# Patient Record
Sex: Male | Born: 1945 | Race: White | Hispanic: No | Marital: Married | Smoking: Never smoker
Health system: Southern US, Community
[De-identification: ages and names within clinical notes are randomized; demographics above are authoritative.]

## PROBLEM LIST (undated history)

## (undated) DIAGNOSIS — R55 Syncope and collapse: Secondary | ICD-10-CM

## (undated) DIAGNOSIS — I071 Rheumatic tricuspid insufficiency: Secondary | ICD-10-CM

## (undated) DIAGNOSIS — E785 Hyperlipidemia, unspecified: Secondary | ICD-10-CM

## (undated) DIAGNOSIS — M19019 Primary osteoarthritis, unspecified shoulder: Secondary | ICD-10-CM

## (undated) DIAGNOSIS — I517 Cardiomegaly: Secondary | ICD-10-CM

## (undated) DIAGNOSIS — Z950 Presence of cardiac pacemaker: Secondary | ICD-10-CM

## (undated) DIAGNOSIS — I447 Left bundle-branch block, unspecified: Secondary | ICD-10-CM

## (undated) DIAGNOSIS — I441 Atrioventricular block, second degree: Secondary | ICD-10-CM

## (undated) DIAGNOSIS — I10 Essential (primary) hypertension: Secondary | ICD-10-CM

## (undated) DIAGNOSIS — I340 Nonrheumatic mitral (valve) insufficiency: Secondary | ICD-10-CM

## (undated) DIAGNOSIS — I428 Other cardiomyopathies: Secondary | ICD-10-CM

## (undated) DIAGNOSIS — R918 Other nonspecific abnormal finding of lung field: Secondary | ICD-10-CM

## (undated) DIAGNOSIS — G4733 Obstructive sleep apnea (adult) (pediatric): Secondary | ICD-10-CM

## (undated) DIAGNOSIS — C439 Malignant melanoma of skin, unspecified: Secondary | ICD-10-CM

## (undated) DIAGNOSIS — E119 Type 2 diabetes mellitus without complications: Secondary | ICD-10-CM

## (undated) HISTORY — DX: Primary osteoarthritis, unspecified shoulder: M19.019

## (undated) HISTORY — PX: VASECTOMY: SHX75

## (undated) HISTORY — DX: Other cardiomyopathies: I42.8

## (undated) HISTORY — PX: UVULECTOMY: SHX2631

## (undated) HISTORY — PX: INGUINAL HERNIA REPAIR: SHX194

## (undated) HISTORY — DX: Left bundle-branch block, unspecified: I44.7

## (undated) HISTORY — DX: Other nonspecific abnormal finding of lung field: R91.8

## (undated) HISTORY — PX: FINGER TENDON REPAIR: SHX1640

## (undated) HISTORY — DX: Syncope and collapse: R55

## (undated) HISTORY — DX: Malignant melanoma of skin, unspecified: C43.9

## (undated) HISTORY — DX: Essential (primary) hypertension: I10

---

## 1966-07-13 DIAGNOSIS — C439 Malignant melanoma of skin, unspecified: Secondary | ICD-10-CM

## 1966-07-13 HISTORY — DX: Malignant melanoma of skin, unspecified: C43.9

## 2006-05-28 LAB — HM COLONOSCOPY: HM Colonoscopy: NORMAL

## 2006-08-26 ENCOUNTER — Ambulatory Visit (HOSPITAL_BASED_OUTPATIENT_CLINIC_OR_DEPARTMENT_OTHER): Admission: RE | Admit: 2006-08-26 | Discharge: 2006-08-26 | Payer: Self-pay | Admitting: Surgery

## 2006-11-25 ENCOUNTER — Emergency Department (HOSPITAL_COMMUNITY): Admission: EM | Admit: 2006-11-25 | Discharge: 2006-11-26 | Payer: Self-pay | Admitting: Emergency Medicine

## 2006-11-30 ENCOUNTER — Ambulatory Visit (HOSPITAL_COMMUNITY): Admission: RE | Admit: 2006-11-30 | Discharge: 2006-11-30 | Payer: Self-pay | Admitting: Orthopaedic Surgery

## 2007-05-24 ENCOUNTER — Ambulatory Visit (HOSPITAL_COMMUNITY): Admission: RE | Admit: 2007-05-24 | Discharge: 2007-05-24 | Payer: Self-pay | Admitting: Surgery

## 2009-07-13 DIAGNOSIS — R55 Syncope and collapse: Secondary | ICD-10-CM

## 2009-07-13 HISTORY — DX: Syncope and collapse: R55

## 2009-11-23 ENCOUNTER — Ambulatory Visit: Payer: Self-pay | Admitting: Cardiology

## 2009-11-23 ENCOUNTER — Observation Stay (HOSPITAL_COMMUNITY): Admission: EM | Admit: 2009-11-23 | Discharge: 2009-11-24 | Payer: Self-pay | Admitting: Emergency Medicine

## 2009-11-24 ENCOUNTER — Encounter (INDEPENDENT_AMBULATORY_CARE_PROVIDER_SITE_OTHER): Payer: Self-pay | Admitting: Internal Medicine

## 2010-02-12 ENCOUNTER — Telehealth (INDEPENDENT_AMBULATORY_CARE_PROVIDER_SITE_OTHER): Payer: Self-pay | Admitting: *Deleted

## 2010-02-13 ENCOUNTER — Encounter: Payer: Self-pay | Admitting: Internal Medicine

## 2010-02-13 ENCOUNTER — Ambulatory Visit: Payer: Self-pay | Admitting: Internal Medicine

## 2010-02-13 ENCOUNTER — Ambulatory Visit: Payer: Self-pay

## 2010-02-13 ENCOUNTER — Encounter (HOSPITAL_COMMUNITY): Admission: RE | Admit: 2010-02-13 | Discharge: 2010-04-03 | Payer: Self-pay | Admitting: Cardiology

## 2010-02-19 ENCOUNTER — Telehealth: Payer: Self-pay | Admitting: Cardiology

## 2010-02-20 ENCOUNTER — Telehealth: Payer: Self-pay | Admitting: Cardiology

## 2010-08-12 NOTE — Assessment & Plan Note (Signed)
Summary: Cardiology Nuclear Testing  Nuclear Med Background Indications for Stress Test: Evaluation for Ischemia, Post Hospital  Indications Comments: 11/20/09 syncope/ new LBBB   History: Echo  History Comments: 05/11 ECHO: EF= 55%, mild LVH  Symptoms: Diaphoresis, Dizziness, Light-Headedness, Syncope    Nuclear Pre-Procedure Cardiac Risk Factors: LBBB, Lipids Caffeine/Decaff Intake: None NPO After: 7:00 PM Lungs: Clear.  O2 Sat 98% on RA. IV 0.9% NS with Angio Cath: 22g     IV Site: (R) AC IV Started by: Irean Hong RN Chest Size (in) 42     Height (in): 74 Weight (lb): 220 BMI: 28.35  Nuclear Med Study 1 or 2 day study:  1 day     Stress Test Type:  Eugenie Birks Reading MD:  Dietrich Pates, MD     Referring MD:  Raquel Sarna, MD [facility] [place] Fax#[phone]) Resting Radionuclide:  Technetium 46m Tetrofosmin     Resting Radionuclide Dose:  10.7 mCi  Stress Radionuclide:  Technetium 25m Tetrofosmin     Stress Radionuclide Dose:  31.7 mCi   Stress Protocol   Lexiscan: 0.4 mg   Stress Test Technologist:  [employer] CMA-N     Nuclear Technologist:  Domenic Polite CNMT  Rest Procedure  Myocardial perfusion imaging was performed at rest 45 minutes following the intravenous administration of Myoview Technetium 16m Tetrofosmin.  Stress Procedure  The patient received IV Lexiscan 0.4 mg over 15-seconds.  Myoview injected at 30-seconds.  There were no significant changes with infusion, other than patient developing LBBB in recovery.  He did c/o chest pressure with infusion.  Quantitative spect images were obtained after a 45 minute delay.  QPS Raw Data Images:  Rest images were motion corrected.  Soft tissue (diaphragm, bowel acitvity) underlie heart. Stress Images:  Thinning in the inferoseptal wall (base) and apex..  Otherwise normal perfusion. Rest Images:  NO signficant change from the stress images. Subtraction (SDS):  No evidence of ischemia. Transient Ischemic  Dilatation:  1.05  (Normal <1.22)  Lung/Heart Ratio:  .32  (Normal <0.45)  Quantitative Gated Spect Images QGS EDV:  119 ml QGS ESV:  61 ml QGS EF:  49 % QGS cine images:  By visual estimate appears slightly better.   Overall Impression  Exercise Capacity: Lexiscan protocol. BP Response: Normal blood pressure response. Clinical Symptoms: Mild chest pressure. ECG Impression: Nondiagnostic as patieitn developed transient LBBB during study. Overall Impression: Normal perfusioin.  No evidence of significant ischemia. Overall Impression Comments: would recommend echo if not done to fully evaluate wall motion, LV function.

## 2010-08-12 NOTE — Progress Notes (Signed)
Summary: Nuclear Pre-Procedure     Phone Note Outgoing Call   Call placed by: Milana Na, EMT-P,  February 12, 2010 3:34 PM Summary of Call: Left message with information on Myoview Information Sheet (see scanned document for details).      Nuclear Med Background Indications for Stress Test: Evaluation for Ischemia, Post Hospital   History: Echo  History Comments: 05/11 ECHO EF 55% mild LVH  Symptoms: Light-Headedness, Syncope    Nuclear Pre-Procedure Cardiac Risk Factors: LBBB, Lipids  Nuclear Med Study Referring MD:  A.Harlene Ramus

## 2010-08-12 NOTE — Progress Notes (Signed)
Summary: Stress test results  Phone Note Call from Patient Call back at Home Phone [phone]   Caller: Patient Summary of Call: test results Initial call taken by: Judie Grieve,  February 19, 2010 2:17 PM  Follow-up for Phone Call        The pt had a myoview in our office 02/13/10.  This test was scheduled by Dr Wray Kearns.  (Referring MD:  Raquel Sarna, MD [facility] [place] Fax#[phone]))  I attempted to reach the pt to let him know he must obtain these results from referring MD.  No answer. Julieta Gutting, RN, BSN  February 19, 2010 2:30 PM  I spoke with the pt's wife and made her aware that the pt will need to obtain his results through the [place] since they are the ordering MD. She is agreeable and will notify the pt. Follow-up by: Sherri Rad, RN, BSN,  February 20, 2010 9:27 AM

## 2010-08-12 NOTE — Progress Notes (Signed)
Summary: results of test  Phone Note Call from Patient   Caller: Patient Reason for Call: Talk to Nurse Summary of Call: [facility], i suggested he call them and he states they may or may not ever call him back, he says we told him when he left we would call him with results -pls call [phone] after 4p Initial call taken by: Glynda Jaeger,  February 20, 2010 1:39 PM  Follow-up for Phone Call        Called pt's home and [facility] that [facility] to [facility] results because the [facility] physician ordered the test. Follow-up by: J REISS RN     Appended Document: results of test agree  Reviewed Juanito Doom, MD

## 2010-09-29 LAB — BASIC METABOLIC PANEL
BUN: 14 mg/dL (ref 6–23)
CO2: 27 mEq/L (ref 19–32)
Calcium: 8.9 mg/dL (ref 8.4–10.5)
Chloride: 105 mEq/L (ref 96–112)
Creatinine, Ser: 1 mg/dL (ref 0.4–1.5)
GFR calc Af Amer: >60 mL/min (ref >60)
GFR calc non Af Amer: >60 mL/min (ref >60)
Glucose, Bld: 121 mg/dL — ABNORMAL HIGH (ref 70–99)
Potassium: 4.2 mEq/L (ref 3.5–5.1)
Sodium: 140 mEq/L (ref 135–145)

## 2010-09-29 LAB — DIFFERENTIAL
Basophils Absolute: 0 10*3/uL (ref 0.0–0.1)
Basophils Relative: 0 % (ref 0–1)
Eosinophils Absolute: 0.2 10*3/uL (ref 0.0–0.7)
Eosinophils Relative: 2 % (ref 0–5)
Lymphocytes Relative: 15 % (ref 12–46)
Lymphs Abs: 1.2 10*3/uL (ref 0.7–4.0)
Monocytes Absolute: 1.3 10*3/uL — ABNORMAL HIGH (ref 0.1–1.0)
Monocytes Relative: 15 % — ABNORMAL HIGH (ref 3–12)
Neutro Abs: 5.7 10*3/uL (ref 1.7–7.7)
Neutrophils Relative %: 68 % (ref 43–77)

## 2010-09-29 LAB — CBC
HCT: 40.6 % (ref 39.0–52.0)
Hemoglobin: 14 g/dL (ref 13.0–17.0)
MCHC: 34.6 g/dL (ref 30.0–36.0)
MCV: 89.7 fL (ref 78.0–100.0)
Platelets: 153 10*3/uL (ref 150–400)
RBC: 4.52 MIL/uL (ref 4.22–5.81)
RDW: 13.8 % (ref 11.5–15.5)
WBC: 8.5 10*3/uL (ref 4.0–10.5)

## 2010-09-29 LAB — LIPID PANEL
Cholesterol: 193 mg/dL (ref 0–200)
HDL: 34 mg/dL — ABNORMAL LOW (ref >39)
LDL Cholesterol: 126 mg/dL — ABNORMAL HIGH (ref 0–99)
Total CHOL/HDL Ratio: 5.7 RATIO
Triglycerides: 166 mg/dL — ABNORMAL HIGH (ref <150)
VLDL: 33 mg/dL (ref 0–40)

## 2010-09-29 LAB — D-DIMER, QUANTITATIVE (NOT AT ARMC): D-Dimer, Quant: <0.22 ug/mL-FEU (ref 0.00–0.48)

## 2010-09-29 LAB — GLUCOSE, CAPILLARY: Glucose-Capillary: 123 mg/dL — ABNORMAL HIGH (ref 70–99)

## 2010-09-30 LAB — PROTIME-INR
INR: 0.93 (ref 0.00–1.49)
Prothrombin Time: 12.4 seconds (ref 11.6–15.2)

## 2010-09-30 LAB — POCT CARDIAC MARKERS
CKMB, poc: 2.8 ng/mL (ref 1.0–8.0)
CKMB, poc: 4 ng/mL (ref 1.0–8.0)
Myoglobin, poc: 128 ng/mL (ref 12–200)
Myoglobin, poc: 93.7 ng/mL (ref 12–200)
Troponin i, poc: <0.05 ng/mL (ref 0.00–0.09)
Troponin i, poc: <0.05 ng/mL (ref 0.00–0.09)

## 2010-09-30 LAB — COMPREHENSIVE METABOLIC PANEL
ALT: 35 U/L (ref 0–53)
AST: 35 U/L (ref 0–37)
Albumin: 4 g/dL (ref 3.5–5.2)
Alkaline Phosphatase: 77 U/L (ref 39–117)
BUN: 16 mg/dL (ref 6–23)
CO2: 29 mEq/L (ref 19–32)
Calcium: 9.4 mg/dL (ref 8.4–10.5)
Chloride: 105 mEq/L (ref 96–112)
Creatinine, Ser: 1.02 mg/dL (ref 0.4–1.5)
GFR calc Af Amer: >60 mL/min (ref >60)
GFR calc non Af Amer: >60 mL/min (ref >60)
Glucose, Bld: 147 mg/dL — ABNORMAL HIGH (ref 70–99)
Potassium: 3.9 mEq/L (ref 3.5–5.1)
Sodium: 140 mEq/L (ref 135–145)
Total Bilirubin: 0.6 mg/dL (ref 0.3–1.2)
Total Protein: 6.9 g/dL (ref 6.0–8.3)

## 2010-09-30 LAB — HEMOGLOBIN A1C
Hgb A1c MFr Bld: 6.4 % — ABNORMAL HIGH (ref <5.7)
Mean Plasma Glucose: 137 mg/dL — ABNORMAL HIGH (ref <117)

## 2010-09-30 LAB — CBC
HCT: 43.8 % (ref 39.0–52.0)
Hemoglobin: 15.1 g/dL (ref 13.0–17.0)
MCHC: 34.5 g/dL (ref 30.0–36.0)
MCV: 90.3 fL (ref 78.0–100.0)
Platelets: 165 10*3/uL (ref 150–400)
RBC: 4.85 MIL/uL (ref 4.22–5.81)
RDW: 13.6 % (ref 11.5–15.5)
WBC: 8.4 10*3/uL (ref 4.0–10.5)

## 2010-09-30 LAB — DIFFERENTIAL
Basophils Absolute: 0 10*3/uL (ref 0.0–0.1)
Basophils Relative: 0 % (ref 0–1)
Eosinophils Absolute: 0.1 10*3/uL (ref 0.0–0.7)
Eosinophils Relative: 1 % (ref 0–5)
Lymphocytes Relative: 7 % — ABNORMAL LOW (ref 12–46)
Lymphs Abs: 0.6 10*3/uL — ABNORMAL LOW (ref 0.7–4.0)
Monocytes Absolute: 0.8 10*3/uL (ref 0.1–1.0)
Monocytes Relative: 10 % (ref 3–12)
Neutro Abs: 6.9 10*3/uL (ref 1.7–7.7)
Neutrophils Relative %: 82 % — ABNORMAL HIGH (ref 43–77)

## 2010-09-30 LAB — CK TOTAL AND CKMB (NOT AT ARMC)
CK, MB: 3.7 ng/mL (ref 0.3–4.0)
Relative Index: 2 (ref 0.0–2.5)
Total CK: 184 U/L (ref 7–232)

## 2010-09-30 LAB — MAGNESIUM: Magnesium: 2.4 mg/dL (ref 1.5–2.5)

## 2010-09-30 LAB — TROPONIN I: Troponin I: 0.03 ng/mL (ref 0.00–0.06)

## 2010-09-30 LAB — APTT: aPTT: 27 seconds (ref 24–37)

## 2010-11-25 NOTE — Op Note (Signed)
NAMEMALEKE, FERIA                 ACCOUNT NO.:  [id number]   MEDICAL RECORD NO.:  [id number]          PATIENT TYPE:  AMB   LOCATION:  DAY                          FACILITY:  [facility]   PHYSICIAN:  Wilmon Arms. Corliss Skains, M.D. DATE OF BIRTH:  1945-10-14   DATE OF PROCEDURE:  05/24/2007  DATE OF DISCHARGE:                               OPERATIVE REPORT   PREOPERATIVE DIAGNOSIS:  Left inguinal hernia.   POSTOPERATIVE DIAGNOSIS:  Left inguinal hernia.   PROCEDURE PERFORMED:  Left inguinal hernia repair with mesh.   SURGEON:  Wilmon Arms. Tsuei, M.D.   ANESTHESIA:  General via LMA.   INDICATIONS:  The patient is a 65 year old male who had a right inguinal  hernia repaired last year.  The patient had a hand injury and has  required physical therapy for the last several months.  He has been  developing some left groin pain.  He presents now for surgical  evaluation.   DESCRIPTION OF PROCEDURE:  The patient is brought to the operating room  and placed in the supine position on the operating table.  After  adequate level of general anesthesia was obtained, the patient's left  groin was shaved, prepped with Betadine and draped in sterile fashion.  Time-out was taken to assure the proper patient, proper procedure.  The  area above the left inguinal ligament was infiltrated with 0.25%  Marcaine.  An oblique incision was made.  Dissection was carried down to  the fascia.  The fascia was opened along direction of its fibers down  the external ring.  I bluntly dissected around the spermatic cord and  retracted with a Penrose Rose drain.  The floor of the inguinal canal  appeared lax, but there was no direct defect.  A moderate-sized indirect  hernia sac was dissected free from the spermatic cord and reduced up to  the internal ring.  Some cord lipoma was also removed and reduced.  The  internal ring was tightened with a figure-of-eight 2-0 Vicryl suture.  A  3-inch x 6-inch Ultrapro mesh was cut into  a keyhole shape and secured  beginning at the pubic tubercle with interrupted 2-0 Vicryl sutures.  This was secured to the shelving edge of the inguinal ligament  inferiorly and the internal oblique superiorly.  The tails were sutured  together around the spermatic cord and tucked underneath the external  oblique fascia.  The external oblique fascia was reapproximated with 2-0  Vicryl suture.  A 3-0 Vicryl was used to close the subcutaneous tissues,  and 4-0 Monocryl was used to close skin.  Steri-Strips and clean  dressings were applied.  The patient was extubated and brought to  recovery room in stable condition.  All sponge, instrument, and needle  counts were correct.      Wilmon Arms. Tsuei, M.D.  Electronically Signed     MKT/MEDQ  D:  05/24/2007  T:  05/24/2007  Job:  [id number]

## 2010-11-25 NOTE — Op Note (Signed)
NAMEIZAIH, KATAOKA                 ACCOUNT NO.:  [id number]   MEDICAL RECORD NO.:  [id number]          PATIENT TYPE:  AMB   LOCATION:  SDS                          FACILITY:  [facility]   PHYSICIAN:  Vanita Panda. Magnus Ivan, M.D.DATE OF BIRTH:  [birth year]   DATE OF PROCEDURE:  11/30/2006  DATE OF DISCHARGE:                               OPERATIVE REPORT   PREOPERATIVE NOTE:  Right middle finger extensor tendon/central slip  injury.   POSTOPERATIVE DIAGNOSIS:  Right middle finger extensor tendon/central  slip injury.   PROCEDURE:  Direct primary repair of right middle finger extensor  tendon/central slip.   SURGEON:  Vanita Panda. Magnus Ivan, M.D.   ANESTHESIA:  1. IV sedation.  2. Digital block right middle finger with 0.25% plain Marcaine.   ANTIBIOTICS:  1 gram IV Ancef.   BLOOD LOSS:  Minimal.   COMPLICATIONS:  None.   INDICATIONS:  Briefly, Mr. Sawchuk is a 60-year right hand dominant male  who works for the [employer].  He was breaking up some boxes with  someone of work when the person accidentally slipped with the box cutter  and cut across the dorsal surface of his right dominant middle finger at  the level of the PIP joint and also across the index finger toward the  DIP joint.  The ER physician saw him and cleaned the wounds and  recognized that he had a deep wound that likely involved the central  slip of the extensor tendon. On examination in my office the next day,  he did have weakness with extension, he could extend through his  lumbricals and flexed, but he certainly had pain over that joint.  I  recommended he undergo exploration of the middle finger with direct  primary repair of the tendon. As far as the index finger goes, I think  this represents more of a very mild maldeformity and he can extend his  DIP joint almost fully but cannot hyperextend.  This will be treated  with splinting. The risks and benefits of surgery were explained and  well  understood.  He agreed to proceed with surgery.   PROCEDURE DESCRIPTION:  After informed consent was obtained, the  appropriate right arm was marked.  Mr. Karel was brought to the  operating room and placed supine on the operating table.  IV sedation  was obtained.  The hand was prepped and draped with Betadine scrub and  paint.  I then performed a digital block in the middle finger on the  right hand with 0.25% plain Marcaine.  I used a little Penrose drain as  a local tourniquet.  His incision was a transverse incision directly  over the PIP joint.  I extended this in a zigzag fashion proximally and  distally to expose the central slip. There was a direct laceration of  the central slip.  I irrigated this out and closed this direct  laceration with interrupted simple sutures 3-0 Ethilon suture.  The  wound was then irrigated again. I reapproximated the skin with  interrupted 3-0 Prolene suture.  Xeroform followed by a well  padded  sterile dressing and little finger splints were applied to the middle  finger and the small finger splint to the DIP joint of the right index  finger.  The patient tolerated the procedure well and was taken to the  recovery room in stable condition.   Postoperatively, I will start an extensor tendon repair protocol with  active passive dynamic splinting and we will follow him up accordingly.      Vanita Panda. Magnus Ivan, M.D.  Electronically Signed     CYB/MEDQ  D:  11/30/2006  T:  11/30/2006  Job:  [id number]

## 2011-02-11 HISTORY — PX: KNEE ARTHROSCOPY: SHX127

## 2011-04-21 LAB — BASIC METABOLIC PANEL
BUN: 18
CO2: 25
Calcium: 9
Chloride: 105
Creatinine, Ser: 1
GFR calc Af Amer: >60
GFR calc non Af Amer: >60
Glucose, Bld: 113 — ABNORMAL HIGH
Potassium: 4
Sodium: 139

## 2011-04-21 LAB — CBC
HCT: 38.7 — ABNORMAL LOW
Hemoglobin: 13.3
MCHC: 34.3
MCV: 87.8
Platelets: 243
RBC: 4.4
RDW: 13.8
WBC: 4.7

## 2011-04-21 LAB — DIFFERENTIAL
Basophils Absolute: 0
Basophils Relative: 0
Eosinophils Absolute: 0.1 — ABNORMAL LOW
Eosinophils Relative: 3
Lymphocytes Relative: 24
Lymphs Abs: 1.1
Monocytes Absolute: 0.5
Monocytes Relative: 12
Neutro Abs: 2.9
Neutrophils Relative %: 62

## 2011-08-05 ENCOUNTER — Encounter: Payer: Self-pay | Admitting: Family Medicine

## 2011-08-05 ENCOUNTER — Ambulatory Visit (INDEPENDENT_AMBULATORY_CARE_PROVIDER_SITE_OTHER): Payer: Medicare Other | Admitting: Family Medicine

## 2011-08-05 VITALS — BP 140/80 | HR 68 | Ht 72.5 in | Wt 216.0 lb

## 2011-08-05 DIAGNOSIS — H8110 Benign paroxysmal vertigo, unspecified ear: Secondary | ICD-10-CM

## 2011-08-05 DIAGNOSIS — E7800 Pure hypercholesterolemia, unspecified: Secondary | ICD-10-CM

## 2011-08-05 NOTE — Progress Notes (Signed)
Chief complaint: if he has is head down too long and they he puts his head up the rooms spins x 1-2 months. Also wants to re-establish care, former pt from [place]  HPI: Had some vertigo when leans forward (to tie shoe, looking down to button jacket).  Also had similar symptoms when looking up, but not as severe.  Initially, vertigo occurred just about every time he looked down (or up), but is improving, less frequent now. No symptoms today.  Was much worse a week ago when he scheduled the appointment.    He got sick just after Thanksgiving, with a cough that lasted until early January.  Cough has finally resolved.  No longer coughing up any discolored mucus.  Never had nasal or sinus congestion.  Past Medical History  Diagnosis Date  . Pure hypercholesterolemia   . Syncope 2011    negative workup except LBBB  . LBBB (left bundle branch block)     had cardiology eval.  Seems to occur with exertion, not at rest (never had cath)    Past Surgical History  Procedure Date  . Knee arthroscopy 02/2011    L knee for meniscal tear. Dr. Magnus Ivan  . Inguinal hernia repair     bilateral  . Finger tendon repair     R 2nd and 3rd finger  . Uvulectomy     laser treatment for sleep apnea (NOT UP3)  . Vasectomy     History   Social History  . Marital Status: Married    Spouse Name: N/A    Number of Children: 4  . Years of Education: N/A   Occupational History  . retired    Social History Main Topics  . Smoking status: Never Smoker   . Smokeless tobacco: Not on file  . Alcohol Use: Yes     3-4 drinks per month.  . Drug Use: No  . Sexually Active: Not on file   Other Topics Concern  . Not on file   Social History Narrative   Lives with wife, cat.  Daughter, Denny Peon, in [place], 2 children in [place], son in [place]    Family History  Problem Relation Age of Onset  . Dementia Mother   . Cancer Mother     ?type, was told it contributed to death, per pt  . Melanoma Mother 35  . Stroke Father  94    cerebral aneurysm  . Diabetes Sister     borderline, obese  . Dementia Paternal Aunt   . Diabetes Maternal Grandmother   . Heart disease Maternal Grandfather     Current outpatient prescriptions:aspirin 81 MG tablet, Take 81 mg by mouth daily., Disp: , Rfl: ;  Coenzyme Q10 (COQ10) 100 MG CAPS, Take 1 capsule by mouth 2 (two) times daily., Disp: , Rfl: ;  fish oil-omega-3 fatty acids 1000 MG capsule, Take 1 g by mouth daily., Disp: , Rfl: ;  Multiple Vitamins-Minerals (MENS 50+ MULTI VITAMIN/MIN PO), Take 1 tablet by mouth daily., Disp: , Rfl:  pravastatin (PRAVACHOL) 20 MG tablet, Take 20 mg by mouth daily., Disp: , Rfl:   Allergies  Allergen Reactions  . Eggs Or Egg-Derived Products Anaphylaxis  . Atorvastatin Other (See Comments)    Myalgias.  . Horse-Derived Products     REACTION: Anaphlactic Reaction.  Can't take tetanus shot  . Simvastatin Other (See Comments)    Myalgias.  . Adhesive (Tape) Itching and Rash   ROS:  Some left knee pain, thinks he reinjured L knee  while in [place].   Improving with ibuprofen.  Denies headaches, chest pain, shortness of breath, fevers, GI complaints.  No further syncope (episode in 2011)  PHYSICAL EXAM: BP 140/80  Pulse 68  Ht 6' 0.5" (1.842 m)  Wt 216 lb (97.977 kg)  BMI 28.89 kg/m2 Well developed, pleasant male in no distress HEENT: PERRL, EOMI, conjunctiva clear, fundi normal.  TM's and EAC's normal.  OP clear.  Sinuses nontender Neck: no lymphadenopathy, thyromegaly, mass or carotid bruit Heart: regular rate and rhythm without murmur Lungs: clear bilaterally Abdomen: soft, nontender, no organomegaly or mass Extremities: no edema, 2+ pulse Neuro: alert and oriented.  Cranial nerves 2-12 intact.  Normal strength, sensation, finger-to-nose.  Normal gait.  No nystagmus or symptoms with position changes.  DTR's 2+ and symmetric Skin: no rash Psych: normal mood, affect, hygiene and grooming  ASSESSMENT/PLAN: 1. Benign positional vertigo      resolved  2. Pure hypercholesterolemia    Borderline BP's.  Had home BP monitor, and had lots of fluctuations.  116/78 when donated blood at [facility] last week. Continue to monitor periodically.  Use Meclizine if recurrent positional vertigo, f/u if not resolving on its own.  Further weight loss encouraged (has lost 5 pounds so far), and daily exercise

## 2011-08-05 NOTE — Patient Instructions (Signed)
Benign Positional Vertigo Vertigo is a feeling that you are unsteady or that you or your surroundings are moving. Benign positional vertigo (BPV) is the most common form of vertigo. Benign means it does not have a serious cause. BPV is an upset in the balance system in your middle ear. This is troublesome but usually not serious. A viral infection or head injury are common causes, but often no cause is found. It is more common as we grow older. SYMPTOMS  Sudden dizziness happens when you move your head in different directions. Some of the problems that come with this are:  Loss of balance.   Throwing up (vomiting).   Blurred vision.   Dizziness .   Feeling sick to your stomach (nauseated).  DIAGNOSIS  Your caregiver may do some specialized testing to prove what is wrong. HOME CARE INSTRUCTIONS   Rest and eat a well-balanced diet.   Move slowly and do not make sudden body or head movements.   Do not drive a car or do any activities that could hurt you or others.   Lie down and rest. Take precautions to prevent falls.  SEEK IMMEDIATE MEDICAL CARE IF:   You develop headaches which are severe or lasting.   You develop continued vomiting.   A temporary loss or change of vision appears.   You notice temporary numbness on one side of your body.   You are temporarily unable to speak.   Temporary areas of weakness develop.   You have weakness or numbness in the face, arms, or legs.   You notice dizziness or difficulty walking.   You experience slurred speech or difficulty swallowing.  MAKE SURE YOU:   Understand these instructions.   Will watch your condition.   Will get help right away if you are not doing well or get worse.  Document Released: 04/06/2006 Document Revised: 01/12/2011 Document Reviewed: 04/15/2006 [facility] Patient Information 2012 [place], [place].   You may use meclizine if symptoms recur.  This is available over-the-counter ([place], or Less Drowsy  formulation of Dramamine)

## 2012-01-04 ENCOUNTER — Ambulatory Visit (INDEPENDENT_AMBULATORY_CARE_PROVIDER_SITE_OTHER): Payer: Medicare Other | Admitting: Family Medicine

## 2012-01-04 ENCOUNTER — Encounter: Payer: Self-pay | Admitting: Family Medicine

## 2012-01-04 VITALS — BP 128/84 | HR 72 | Ht 72.5 in | Wt 213.0 lb

## 2012-01-04 DIAGNOSIS — R7301 Impaired fasting glucose: Secondary | ICD-10-CM | POA: Insufficient documentation

## 2012-01-04 DIAGNOSIS — E7800 Pure hypercholesterolemia, unspecified: Secondary | ICD-10-CM

## 2012-01-04 LAB — POCT GLYCOSYLATED HEMOGLOBIN (HGB A1C): Hemoglobin A1C: 6.1

## 2012-01-04 LAB — POCT CBG (FASTING - GLUCOSE)-MANUAL ENTRY: Glucose Fasting, POC: 101 mg/dL — AB (ref 70–99)

## 2012-01-04 NOTE — Progress Notes (Signed)
Chief Complaint  Patient presents with  . Advice Only    [facility] would like second opinion today. Patient fasting today. He was on a 16 day cruise during the 90 day period in which his A1c that was done by [place] reflects.   HPI:  Alexander Curtis to [place] for a routine check, and was noted to have an elevated blood sugar (fasing glu 118, A1c 6.7).  Last year fasting glucose was 99.  He went back on 6/20, and met with doctor, pharmacist, nutritionist in a group setting, 2 hour class.  Given glucometer, and was given rx for metformin, but was told not to start it until after his class.  He was told at the time of the class that he should start the metformin.  Fasting sugar was 115 this morning at home, 101 here in office. He is questioning the diagnosis of diabetes, and whether or not he really needs to start the metformin.  He has had some weight gain since he moved here, related to hernia surgeries and knee pain--now s/p surgery and able to resume walking. He reports that he was on a cruise for 16 days, staring 3/28.  He admits to drinking more alcohol, eating more food, including more sweets than is normal for him.  Since he had the class a few weeks ago, he has made significant changes to his diet.  He denies polydipsia, polyuria, vision changes.  Hyperlipidemia--labs at [place]: Lipids: chol 157, TG 120, HDL 40, LDL 93  Past Medical History  Diagnosis Date  . Pure hypercholesterolemia   . Syncope 2011    negative workup except LBBB  . LBBB (left bundle branch block)     had cardiology eval.  Seems to occur with exertion, not at rest (never had cath)    Past Surgical History  Procedure Date  . Knee arthroscopy 02/2011    L knee for meniscal tear. Dr. Magnus Ivan  . Inguinal hernia repair     bilateral  . Finger tendon repair     R 2nd and 3rd finger  . Uvulectomy     laser treatment for sleep apnea (NOT UP3)  . Vasectomy     History   Social History  . Marital Status:  Married    Spouse Name: N/A    Number of Children: 4  . Years of Education: N/A   Occupational History  . retired    Social History Main Topics  . Smoking status: Never Smoker   . Smokeless tobacco: Never Used  . Alcohol Use: Yes     3-4 drinks per month.  . Drug Use: No  . Sexually Active: Not on file   Other Topics Concern  . Not on file   Social History Narrative   Lives with wife, cat.  Daughter, Denny Peon, in [place], 2 children in [place], son in [place]    Family History  Problem Relation Age of Onset  . Dementia Mother   . Cancer Mother     ?type, was told it contributed to death, per pt  . Melanoma Mother 32  . Stroke Father 90    cerebral aneurysm  . Diabetes Sister     borderline, obese  . Dementia Paternal Aunt   . Diabetes Maternal Grandmother   . Heart disease Maternal Grandfather     Current outpatient prescriptions:aspirin 81 MG tablet, Take 81 mg by mouth daily., Disp: , Rfl: ;  Coenzyme Q10 (COQ10) 100 MG CAPS, Take 1 capsule by  mouth 2 (two) times daily., Disp: , Rfl: ;  fish oil-omega-3 fatty acids 1000 MG capsule, Take 2 g by mouth daily. , Disp: , Rfl: ;  Multiple Vitamins-Minerals (MENS 50+ MULTI VITAMIN/MIN PO), Take 1 tablet by mouth daily., Disp: , Rfl:  pravastatin (PRAVACHOL) 20 MG tablet, Take 20 mg by mouth daily., Disp: , Rfl:   Allergies  Allergen Reactions  . Eggs Or Egg-Derived Products Anaphylaxis  . Atorvastatin Other (See Comments)    Myalgias.  . Horse-Derived Products     REACTION: Anaphlactic Reaction.  Can't take tetanus shot  . Simvastatin Other (See Comments)    Myalgias.  . Adhesive (Tape) Itching and Rash   ROS: Denies fevers, URI symptoms, cough, shortness of breath, chest pain, GI complaints, bowel changes, urinary complaints, skin lesions/rashes, joint pains  PHYSICAL EXAM: BP 128/84  Pulse 72  Ht 6' 0.5" (1.842 m)  Wt 213 lb (96.616 kg)  BMI 28.49 kg/m2 Well developed, pleasant male, in no distress Remainder of visit was  limited to discussion, counseling.  Fasting glu 101 today Lab Results  Component Value Date   HGBA1C 6.1 01/04/2012   ASSESSMENT/PLAN: 1. Impaired fasting glucose  HgB A1c, Glucose (CBG), Fasting  2. Pure hypercholesterolemia     Impaired fasting glucose--counseled regarding diet, exercise, weight loss.  He technically meets criteria for DM based on A1c>6.5 at [place], but just a few weeks later, A1c is already down to 6.1.  Likely his cruise, therefore, was a significant contributing factor, and sugars are much improved after dietary changes after diabetes education.  We discussed that metformin could be used, but given significant improvement in sugars with dietary changes, if he'd like to hold off on starting, that's okay.  If A1c in 3 months remains >6.5, then should be started.  He prefers not to start medications--we discussed that it isn't inappropriate in anyway to start the medication, but it is fine to wait and see, work on diet/exercise/weight loss rather than starting meds just yet.  Hyperlipidemia--at goal per [facility] labs.  Continue pravastatin, which he seems to be tolerating better than other statins he tried.  All questions were answered.  25 minute visit spent counseling. F/u prn--most issues are treated at [facility], this was just a consult/second opinion.

## 2012-01-04 NOTE — Patient Instructions (Addendum)
Your A1c was 6.1 today.  Technically you meet criteria [facility].   Based on today's A1c and your sugar of 115, this falls under "impaired fasting glucose" which is a pre-diabetic condition.  I think it is very reasonable to hold off on starting the metformin, and give the diet and exercise a 3 month trial.  Periodically check your sugars (both fasting, and 2 hours after meals (bedtime))

## 2012-04-11 ENCOUNTER — Encounter: Payer: Self-pay | Admitting: Family Medicine

## 2012-04-11 ENCOUNTER — Ambulatory Visit (INDEPENDENT_AMBULATORY_CARE_PROVIDER_SITE_OTHER): Payer: Medicare Other | Admitting: Family Medicine

## 2012-04-11 VITALS — BP 128/68 | HR 72 | Ht 72.5 in | Wt 200.0 lb

## 2012-04-11 DIAGNOSIS — R7301 Impaired fasting glucose: Secondary | ICD-10-CM

## 2012-04-11 DIAGNOSIS — E7800 Pure hypercholesterolemia, unspecified: Secondary | ICD-10-CM

## 2012-04-11 LAB — POCT GLYCOSYLATED HEMOGLOBIN (HGB A1C): Hemoglobin A1C: 5.9

## 2012-04-11 NOTE — Patient Instructions (Signed)
Continue with the red yeast rice and fish oil.   Bring your records regarding your allergies to vaccines so we can see if safe to give you flu shot when you return for fasting labs.   To be safe, I will likely have you take the REGULAR flu shot (not high dose) for the firs time, and make you stay to be monitored for 20 minutes after taking the shot. I'd like to review the records first before giving any vaccine.

## 2012-04-11 NOTE — Progress Notes (Signed)
Chief Complaint  Patient presents with  . Diabetes    fasting follow up. Patient states he also took himself off pravastatin 20mg -hasn't taken in a month and half, at least. Would like cholesterol checked if possible.   HPI:  Patient presents to f/u on DM/IFG and cholesterol.  He has lost 13 pounds since his last visit. Didn't check his sugars while on vacation recently. Sugar was 113 on return from trip, but has remained <110 since then, <100 if he eats less fruit.  Has been walking daily.  Plans to start Silver Sneakers at gym at least 3x/week  He had been tolerating the pravastatin but due to concerns over possibly increasing blood sugar, he stopped taking it about 6 weeks ago, and changed over to OTC Red Yeast Rice and he increased fish oil to 3/day.  He is not having any side effects.  He also added cinnamon. Wanting to know if he can stay on this regimen.  He had anaphylactic reaction when in [employer] after receiving multiple vaccines.  Recalls it being tetanus, plague, and others (while in [place]).  Doesn't believe that flu shot was one of the vaccines.  Recalls something about horse serum. He eats eggs without problems, but has told us possible egg allergy in past (documented in chart).  Hasn't had a tetanus shot since, due to possible allergy.  He has never had a flu shot.  Past Medical History  Diagnosis Date  . Pure hypercholesterolemia   . Syncope 2011    negative workup except LBBB  . LBBB (left bundle branch block)     had cardiology eval.  Seems to occur with exertion, not at rest (never had cath)   Past Surgical History  Procedure Date  . Knee arthroscopy 02/2011    L knee for meniscal tear. Dr. Magnus Ivan  . Inguinal hernia repair     bilateral  . Finger tendon repair     R 2nd and 3rd finger  . Uvulectomy     laser treatment for sleep apnea (NOT UP3)  . Vasectomy    History   Social History  . Marital Status: Married    Spouse Name: N/A    Number of Children: 4    . Years of Education: N/A   Occupational History  . retired    Social History Main Topics  . Smoking status: Never Smoker   . Smokeless tobacco: Never Used  . Alcohol Use: Yes     3-4 drinks per month.  . Drug Use: No  . Sexually Active: Not on file   Other Topics Concern  . Not on file   Social History Narrative   Lives with wife, cat.  Daughter, Denny Peon, in [place], 2 children in [place], son in [place]   Current outpatient prescriptions:aspirin 81 MG tablet, Take 81 mg by mouth daily., Disp: , Rfl: ;  CINNAMON PO, Take 1 capsule by mouth 3 (three) times daily., Disp: , Rfl: ;  Coenzyme Q10 (COQ10) 100 MG CAPS, Take 1 capsule by mouth 2 (two) times daily., Disp: , Rfl: ;  fish oil-omega-3 fatty acids 1000 MG capsule, Take 2 g by mouth daily. , Disp: , Rfl:  Multiple Vitamins-Minerals (MENS 50+ MULTI VITAMIN/MIN PO), Take 1 tablet by mouth daily., Disp: , Rfl: ;  Red Yeast Rice Extract (RED YEAST RICE PO), Take 1 capsule by mouth 2 (two) times daily., Disp: , Rfl: ;  pravastatin (PRAVACHOL) 20 MG tablet, Take 20 mg by mouth daily., Disp: , Rfl:  (  not taking the pravastatin).  Allergies  Allergen Reactions  . Eggs Or Egg-Derived Products Anaphylaxis  . Atorvastatin Other (See Comments)    Myalgias.  . Horse-Derived Products     REACTION: Anaphlactic Reaction.  Can't take tetanus shot  . Simvastatin Other (See Comments)    Myalgias.  . Adhesive (Tape) Itching and Rash   ROS: denies fevers, URI symptoms, cough, shortness of breath, chest pain, myalgias, skin rash or other concerns.  PHYSICAL EXAM: BP 128/68  Pulse 72  Ht 6' 0.5" (1.842 m)  Wt 200 lb (90.719 kg)  BMI 26.75 kg/m2 Well developed male in no distress Neck: no lymphadenopathy or mass Heart: regular rate and rhythm without murmur Lungs: clear bilaterally Abdomen: soft, nontender, no mass Extremities: no edema, normal pulses Skin: no rash Psych: normal mood, affect, hygiene and grooming  Lab Results  Component Value  Date   HGBA1C 5.9 04/11/2012   ASSESSMENT/PLAN: 1. Pure hypercholesterolemia  Lipid panel, Hepatic function panel  2. Impaired fasting glucose  HgB A1c, Glucose, random   IFG--improved with weight loss and dietary changes.  Can remain off medications. Hyperlipidemia--discussed goal of LDL<100 (due to IFG). Discussed that RYR is similar to statin, but may not actually be safer.  There is slight increase of sugars with statins.  It is too soon to check, so prefer to wait 2-3 weeks for checking lipid panel.  If LDL significantly above 100, will need to change back to pravastatin, which he has tolerated without side effects.  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 Give regular strength flu shot (and if tolerates without problems, then give high dose flu shot next year).  Return in 2-3 weeks for fasting lipids, lft's, glucose, and review of records to determine if okay to give flu shot.  Will have him stay for observation after receiving vaccine.

## 2012-05-02 ENCOUNTER — Other Ambulatory Visit: Payer: Medicare Other

## 2012-05-02 DIAGNOSIS — E7800 Pure hypercholesterolemia, unspecified: Secondary | ICD-10-CM

## 2012-05-02 DIAGNOSIS — Z23 Encounter for immunization: Secondary | ICD-10-CM

## 2012-05-02 DIAGNOSIS — R7301 Impaired fasting glucose: Secondary | ICD-10-CM

## 2012-05-02 MED ORDER — INFLUENZA VIRUS VACC SPLIT PF IM SUSP: 0.5000 mL | Freq: Once | INTRAMUSCULAR | Status: AC

## 2012-05-03 ENCOUNTER — Encounter: Payer: Self-pay | Admitting: Family Medicine

## 2012-05-03 LAB — LIPID PANEL
Cholesterol: 183 mg/dL (ref 0–200)
HDL: 43 mg/dL (ref >39)
LDL Cholesterol: 130 mg/dL — ABNORMAL HIGH (ref 0–99)
Total CHOL/HDL Ratio: 4.3 Ratio
Triglycerides: 50 mg/dL (ref <150)
VLDL: 10 mg/dL (ref 0–40)

## 2012-05-03 LAB — HEPATIC FUNCTION PANEL
ALT: 24 U/L (ref 0–53)
AST: 29 U/L (ref 0–37)
Albumin: 4.1 g/dL (ref 3.5–5.2)
Alkaline Phosphatase: 58 U/L (ref 39–117)
Bilirubin, Direct: 0.1 mg/dL (ref 0.0–0.3)
Indirect Bilirubin: 0.5 mg/dL (ref 0.0–0.9)
Total Bilirubin: 0.6 mg/dL (ref 0.3–1.2)
Total Protein: 6.1 g/dL (ref 6.0–8.3)

## 2012-05-03 LAB — GLUCOSE, RANDOM: Glucose, Bld: 90 mg/dL (ref 70–99)

## 2012-07-28 ENCOUNTER — Telehealth: Payer: Self-pay | Admitting: Internal Medicine

## 2012-07-28 NOTE — Telephone Encounter (Signed)
Okay for referral.  I don't have documented when last colonoscopy was (you may need to call pt for info for referral)

## 2012-07-28 NOTE — Telephone Encounter (Signed)
Spoke with patient and he had the last colonoscopy 5 years ago in [place] through the [place]. He is not currently having any problems he thought you were supposed to have a colonoscopy every 5 years. He states that he is absolutely positive that his last one was negative, no findings at all. He said he would like to now wait and try to get the report to see when they recommended next-7 or 10 years maybe. He did state that the [place] is usually very good about calling for follow ups when due-he will call back if he needs referral. Thanks.

## 2012-07-28 NOTE — Telephone Encounter (Signed)
Noted. Await records or his call before proceeding further

## 2012-12-14 ENCOUNTER — Telehealth: Payer: Self-pay | Admitting: Family Medicine

## 2012-12-14 NOTE — Telephone Encounter (Signed)
Pt called and states he needs a letter for the [facility] hospital stating that he has never been diagnosed here with diabetes, nor has he ever been treated here for diabetes.  [facility].  That we only did a 2nd lab test to confirm the diabetes for him.  Pt ph [phone]

## 2012-12-14 NOTE — Telephone Encounter (Signed)
Okay to write note/letter stating that he was never diagnosed or treated for diabetes in this office.  Blood testing here revealed impaired fasting glucose. This improved with dietary measures

## 2012-12-19 ENCOUNTER — Encounter: Payer: Self-pay | Admitting: Family Medicine

## 2013-02-13 ENCOUNTER — Encounter: Payer: Self-pay | Admitting: Family Medicine

## 2013-02-13 ENCOUNTER — Ambulatory Visit (INDEPENDENT_AMBULATORY_CARE_PROVIDER_SITE_OTHER): Payer: Medicare Other | Admitting: Family Medicine

## 2013-02-13 VITALS — BP 150/80 | HR 64 | Ht 72.25 in | Wt 198.0 lb

## 2013-02-13 DIAGNOSIS — M7631 Iliotibial band syndrome, right leg: Secondary | ICD-10-CM

## 2013-02-13 DIAGNOSIS — M629 Disorder of muscle, unspecified: Secondary | ICD-10-CM

## 2013-02-13 NOTE — Patient Instructions (Signed)
Increase your ibuprofen to 800mg  (4 over-the-counter tablets) with food, three times daily for 1-2 weeks. Do the stretches for the IT band as shown. If you aren't improving as quickly as you would like, I recommend seeing Dr. Vear Clock or his partner at [facility]) on [address]. They do Active Release Technique, which is a soft tissue technique that will work well for your IT band problem.  Iliotibial Band Syndrome with Rehab The iliotibial (IT) band is a tendon that connects the hip muscles to the shinbone (tibia) and to one of the bones of the pelvis (ileum). The IT band passes by the knee and is often irritated by the outer portion of the knee (lateral femoral condyle). A fluid filled sac (bursa) exists between the tendon and the bone, to cushion and reduce friction. Overuse of the tendon may cause excessive friction, which results in IT band syndrome. This condition involves inflammation of the bursa (bursitis) and/or inflammation of the IT band (tendinitis). SYMPTOMS   Pain, tenderness, swelling, warmth, or redness over the IT band, at the outer knee (above the joint).  Pain that travels up or down the thigh or leg.  Initially, pain at the beginning of an exercise, that decreases once warmed up. Eventually, pain throughout the activity, getting worse as the activity continues. May cause the athlete to stop in the middle of training or competing.  Pain that gets worse when running down hills or stairs, on banked tracks, or next to the curb on the street.  Pain that increases when the foot of the affected leg hits the ground.  Possibly, a crackling sound (crepitation) when the tendon or bursa is moved or touched. CAUSES  IT band syndrome is caused by irritation of the IT band and the underlying bursa. This eventually results in inflammation and pain. IT band syndrome is an overuse injury.  RISK INCREASES WITH:  Sports with repetitive knee-bending activities  (distance running, cycling).  Incorrect training techniques, including sudden changes in the intensity, frequency, or duration of training.  Not enough rest between workouts.  Poor strength and flexibility, especially a tight IT band.  Failure to warm up properly before activity.  Bow legs.  Arthritis of the knee. PREVENTION   Warm up and stretch properly before activity.  Allow for adequate recovery between workouts.  Maintain physical fitness:  Strength, flexibility, and endurance.  Cardiovascular fitness.  Learn and use proper training technique, including reducing running mileage, shortening stride, and avoiding running on hills and banked surfaces.  Wear arch supports (orthotics), if you have flat feet. PROGNOSIS  If treated properly, IT band syndrome usually goes away within 6 weeks of treatment. RELATED COMPLICATIONS   Longer healing time, if not properly treated, or if not given enough time to heal.  Recurring inflammation of the tendon and bursa, that may result in a chronic condition.  Recurring symptoms, if activity is resumed too soon, with overuse, with a direct blow, or with poor training technique.  Inability to complete training or competition. TREATMENT  Treatment first involves the use of ice and medicine, to reduce pain and inflammation. The use of strengthening and stretching exercises may help reduce pain with activity. These exercises may be performed at home or with a therapist. For individuals with flat feet, an arch support (orthotic) may be helpful. Some individuals find that wearing a knee sleeve or compression bandage around the knee during workouts provides some relief. Certain training techniques, such as adjusting stride length, avoiding  running on hills or stairs, changing the direction you run on a circular or banked track, or changing the side of the road you run on, if you run next to the curb, may help decrease symptoms of IT band syndrome.  Cyclists may need to change the seat height or foot position on their bicycles. An injection of cortisone into the bursa may be recommended. Surgery to remove the inflamed bursa and/or part of the IT band is only considered after at least 6 months of non-surgical treatment.  MEDICATION   If pain medicine is needed, nonsteroidal anti-inflammatory medicines (aspirin and ibuprofen), or other minor pain relievers (acetaminophen), are often advised.  Do not take pain medicine for 7 days before surgery.  Prescription pain relievers may be given, if your caregiver thinks they are needed. Use only as directed and only as much as you need.  Corticosteroid injections may be given by your caregiver. These injections should be reserved for the most serious cases, because they may only be given a certain number of times. HEAT AND COLD  Cold treatment (icing) should be applied for 10 to 15 minutes every 2 to 3 hours for inflammation and pain, and immediately after activity that aggravates your symptoms. Use ice packs or an ice massage.  Heat treatment may be used before performing stretching and strengthening activities prescribed by your caregiver, physical therapist, or athletic trainer. Use a heat pack or a warm water soak. SEEK MEDICAL CARE IF:   Symptoms get worse or do not improve in 2 to 4 weeks, despite treatment.  New, unexplained symptoms develop. (Drugs used in treatment may produce side effects.) EXERCISES  RANGE OF MOTION (ROM) AND STRETCHING EXERCISES - Iliotibial Band Syndrome These exercises may help you when beginning to rehabilitate your injury. Your symptoms may go away with or without further involvement from your physician, physical therapist or athletic trainer. While completing these exercises, remember:   Restoring tissue flexibility helps normal motion to return to the joints. This allows healthier, less painful movement and activity.  An effective stretch should be held for at  least 30 seconds.  A stretch should never be painful. You should only feel a gentle lengthening or release in the stretched tissue. STRETCH - Quadriceps, Prone   Lie on your stomach on a firm surface, such as a bed or padded floor.  Bend your right / left knee and grasp your ankle. If you are unable to reach your ankle or pant leg, use a belt around your foot to lengthen your reach.  Gently pull your heel toward your buttocks. Your knee should not slide out to the side. You should feel a stretch in the front of your thigh and knee.  Hold this position for ______15-30____ seconds. Repeat ____5-10______ times. Complete this stretch ____1-2______ times per day.  STRETCH  Iliotibial Band  On the floor or bed, lie on your side, so your right / left leg is on top. Bend your knee and grab your ankle.  Slowly bring your knee back so that your thigh is in line with your trunk. Keep your heel at your buttocks and gently arch your back, so your head, shoulders and hips line up.  Slowly lower your leg so that your knee approaches the floor or bed, until you feel a gentle stretch on the outside of your right / left thigh. If you do not feel a stretch and your knee will not fall farther, place the heel of your opposite foot on top  of your knee, and pull your thigh down farther.  Hold this stretch for __________ seconds. Repeat __________ times. Complete this stretch __________ times per day. STRENGTHENING EXERCISES - Iliotibial Band Syndrome Improving the flexibility of the IT band will best relieve your discomfort due to IT band syndrome. Strengthening exercises, however, can help improve both muscle endurance and joint mechanics, reducing the factors that can contribute to this condition. Your physician, physical therapist or athletic trainer may provide you with exercises that train specific muscle groups that are especially weak. The following exercises target muscles that are often weak in people who  have IT band syndrome. STRENGTH - Hip Abductors, Straight Leg Raises  Be aware of your form throughout the entire exercise, so that you exercise the correct muscles. Poor form means that you are not strengthening the correct muscles.  Lie on your side, so that your head, shoulders, knee and hip line up. You may bend your lower knee to help maintain your balance. Your right / left leg should be on top.  Roll your hips slightly forward, so that your hips are stacked directly over each other and your right / left knee is facing forward.  Lift your top leg up 4-6 inches, leading with your heel. Be sure that your foot does not drift forward and that your knee does not roll toward the ceiling.  Hold this position for __________ seconds. You should feel the muscles in your outer hip lifting (you may not notice this until your leg begins to tire).  Slowly lower your leg to the starting position. Allow the muscles to fully relax before beginning the next repetition. Repeat __________ times. Complete this exercise __________ times per day.  STRENGTH - Quad/VMO, Isometric  Sit in a chair with your right / left knee slightly bent. With your fingertips, feel the VMO muscle (just above the inside of your knee). The VMO is important in controlling the position of your kneecap.  Keeping your fingertips on this muscle. Without actually moving your leg, attempt to drive your knee down, as if straightening your leg. You should feel your VMO tense. If you have a difficult time, you may wish to try the same exercise on your healthy knee first.  Tense this muscle as hard as you can, without increasing any knee pain.  Hold for __________ seconds. Relax the muscles slowly and completely between each repetition. Repeat __________ times. Complete this exercise __________ times per day.  Document Released: 06/29/2005 Document Revised: 09/21/2011 Document Reviewed: 10/11/2008 [facility] Patient Information 2014  [place], [place].

## 2013-02-13 NOTE — Progress Notes (Signed)
Chief Complaint  Patient presents with  . Leg Pain    was running (is training for the [place] [employer]). Was running last week and after running his legs just felt strange, aches and pains. Then right hip started really bothering him and the pain is radiating into his right thigh. He is worried about having "torn" something or a blood clot. His event is scheduled for Sept 22nd and he wants to feel better.    Grandson was timing his sprint 1 week ago.  He pushed harder than normal.  Didn't notice any pain, popping or problems immediately, but the following day he noticed increased soreness/fatigue in both thighs.  The left leg discomfort resolved immediately, but he has been having pain at his right lateral hip ongoing.  The pain is now radiating from the lateral hip to the anterior thigh.  Has some pain after getting out of a car, or prolonged sitting in a chair.  As he continues to walk, the pain works itself out.  Today the pain was worse.  He took 600mg  ibuprofen today, which has helped some.    Denies any numbness/tingling/burning pain into thigh.  Denies any weakness, swelling, bruising.    Walking at least 2 miles daily for about a month.  He has been running 110 meters for about 2-3 weeks, and gradually increasing his speed to more of a sprint.  This past sprint was the fastest ever.  Also sprints uphill close to his house at the end of the run.  He came in first place for his age group in the qualifiers in May and will compete in the [employer] in [place] in 6 weeks.  Does the 110 meter sprint, long jump and standing jump.  Past Medical History  Diagnosis Date  . Pure hypercholesterolemia   . Syncope 2011    negative workup except LBBB  . LBBB (left bundle branch block)     had cardiology eval.  Seems to occur with exertion, not at rest (never had cath)   Past Surgical History  Procedure Laterality Date  . Knee arthroscopy  02/2011    L knee for meniscal tear. Dr.  Magnus Ivan  . Inguinal hernia repair      bilateral  . Finger tendon repair      R 2nd and 3rd finger  . Uvulectomy      laser treatment for sleep apnea (NOT UP3)  . Vasectomy     History   Social History  . Marital Status: Married    Spouse Name: N/A    Number of Children: 4  . Years of Education: N/A   Occupational History  . retired    Social History Main Topics  . Smoking status: Never Smoker   . Smokeless tobacco: Never Used  . Alcohol Use: Yes     Comment: 3-4 drinks per month.  . Drug Use: No  . Sexually Active: Not on file   Other Topics Concern  . Not on file   Social History Narrative   Lives with wife, cat.  Daughter, Denny Peon, in [place], 2 children in [place], son in [place]   Current Outpatient Prescriptions on File Prior to Visit  Medication Sig Dispense Refill  . aspirin 81 MG tablet Take 81 mg by mouth daily.      Marland Kitchen CINNAMON PO Take 1 capsule by mouth 3 (three) times daily.      . Coenzyme Q10 (COQ10) 100 MG CAPS Take 1 capsule by mouth 2 (  two) times daily.      . fish oil-omega-3 fatty acids 1000 MG capsule Take 2 g by mouth daily.       . Multiple Vitamins-Minerals (MENS 50+ MULTI VITAMIN/MIN PO) Take 1 tablet by mouth daily.      . Red Yeast Rice Extract (RED YEAST RICE PO) Take 1 capsule by mouth 2 (two) times daily.       Current Facility-Administered Medications on File Prior to Visit  Medication Dose Route Frequency Provider Last Rate Last Dose  . influenza  inactive virus vaccine (FLUZONE/FLUARIX) injection 0.5 mL  0.5 mL Intramuscular Once Joselyn Arrow, MD       Allergies  Allergen Reactions  . Eggs Or Egg-Derived Products Anaphylaxis  . Atorvastatin Other (See Comments)    Myalgias.  . Horse-Derived Products     REACTION: Anaphlactic Reaction.  Can't take tetanus shot  . Simvastatin Other (See Comments)    Myalgias.  . Adhesive (Tape) Itching and Rash   ROS:  Denies fevers, URI symptoms, chest pain, shortness of breath, leg swelling, GI complaints,  numbness, tingling, weakness, bleeding/bruising or other concerns except as per HPI. +intentional weight loss  PHYSICAL EXAM: BP 150/80  Pulse 64  Ht 6' 0.25" (1.835 m)  Wt 198 lb (89.812 kg)  BMI 26.67 kg/m2 Well developed, pleasant male, in mild-mod distress with certain movements.  In no pain at rest Lower extremities:  FROM hip.  Some pain laterally down right hip with ITB stretches.  Minimal tenderness at R trochanteric bursa, with tenderness extending down the ITB.  nontender at knee.  nontender at ASIS.  Some lateral pain with hip internal rotation (stretching ITB), otherwise FROM without pain.  R thigh--there is some point tenderness along distal 1/3 of lateral quadricep.  No cords, warmth, bruising, swelling.  Normal strength, sensation Spine: nontender  ASSESSMENT/PLAN:  Iliotibial band syndrome of right side  Discussed NSAID precautions, risks. He declines rx.  Will take ibuprofen 800mg  TID with food for up to 2 weeks. Instructed on ITB stretches.  Recommended icing, stretches, rest. Consider active release technique as additional therapy if not improving with these measures alone.  Recommended EPC, if needed

## 2013-05-13 DIAGNOSIS — I10 Essential (primary) hypertension: Secondary | ICD-10-CM | POA: Diagnosis present

## 2013-05-13 HISTORY — DX: Essential (primary) hypertension: I10

## 2013-05-18 ENCOUNTER — Ambulatory Visit: Payer: Medicare Other | Attending: Orthopaedic Surgery | Admitting: Physical Therapy

## 2013-05-18 DIAGNOSIS — IMO0001 Reserved for inherently not codable concepts without codable children: Secondary | ICD-10-CM | POA: Insufficient documentation

## 2013-05-18 DIAGNOSIS — R262 Difficulty in walking, not elsewhere classified: Secondary | ICD-10-CM | POA: Insufficient documentation

## 2013-05-18 DIAGNOSIS — M25559 Pain in unspecified hip: Secondary | ICD-10-CM | POA: Insufficient documentation

## 2013-05-22 ENCOUNTER — Ambulatory Visit: Payer: Medicare Other | Admitting: Physical Therapy

## 2013-05-25 ENCOUNTER — Ambulatory Visit: Payer: Medicare Other | Admitting: Physical Therapy

## 2013-05-29 ENCOUNTER — Encounter: Payer: Medicare Other | Admitting: Physical Therapy

## 2013-05-31 ENCOUNTER — Ambulatory Visit: Payer: Medicare Other | Admitting: Physical Therapy

## 2013-06-07 ENCOUNTER — Other Ambulatory Visit: Payer: Self-pay | Admitting: Orthopaedic Surgery

## 2013-06-07 DIAGNOSIS — IMO0002 Reserved for concepts with insufficient information to code with codable children: Secondary | ICD-10-CM

## 2013-06-19 ENCOUNTER — Ambulatory Visit
Admission: RE | Admit: 2013-06-19 | Discharge: 2013-06-19 | Disposition: A | Discharge status: Home / Self Care | Payer: Medicare Other | Source: Ambulatory Visit | Attending: Orthopaedic Surgery | Admitting: Orthopaedic Surgery

## 2013-06-19 DIAGNOSIS — IMO0002 Reserved for concepts with insufficient information to code with codable children: Secondary | ICD-10-CM

## 2013-06-19 MED ORDER — GADOBENATE DIMEGLUMINE 529 MG/ML IV SOLN
18.0000 mL | Freq: Once | INTRAVENOUS | Status: AC | PRN
  Administered 2013-06-19: 18 mL via INTRAVENOUS

## 2013-07-26 ENCOUNTER — Encounter: Payer: Self-pay | Admitting: Family Medicine

## 2013-07-26 ENCOUNTER — Ambulatory Visit (INDEPENDENT_AMBULATORY_CARE_PROVIDER_SITE_OTHER): Payer: Medicare HMO | Admitting: Family Medicine

## 2013-07-26 VITALS — BP 150/82 | HR 76 | Ht 72.5 in | Wt 197.0 lb

## 2013-07-26 DIAGNOSIS — Z79899 Other long term (current) drug therapy: Secondary | ICD-10-CM

## 2013-07-26 DIAGNOSIS — E7800 Pure hypercholesterolemia, unspecified: Secondary | ICD-10-CM

## 2013-07-26 DIAGNOSIS — I10 Essential (primary) hypertension: Secondary | ICD-10-CM

## 2013-07-26 DIAGNOSIS — E119 Type 2 diabetes mellitus without complications: Secondary | ICD-10-CM

## 2013-07-26 MED ORDER — LISINOPRIL 5 MG PO TABS: 5.0000 mg | ORAL_TABLET | Freq: Every day | ORAL | Status: DC

## 2013-07-26 NOTE — Progress Notes (Signed)
Chief Complaint  Patient presents with  . Hypertension    has been having high bp readings. Went to [place] in Nov and reading was high. Was given an rx for bp medication, iron and stool softeners. Went to give blood in December and his bp was fine and his Hgb was 14.0.    He went to [place] for routine visit at the end of November. BP was elevated at 184/89.  (he was in pain at that time). He was started on 1/2 tablet of 5 mg of lisinopril, and he was monitoring his BP at home, and it remained high.  His wife told him to increase to the full tablet, in December and BP came down to 140-148/79-82. He later stated seeing even lower blood pressures (did not bring in a list today, just the monitor with memory, which was not reviewed). He then ran out of the lisinopril, and refill is on the way in the mail (but won't last, because he didn't tell them about the dose change).  He has been out of the medication completely for about 10 days and BP has been 160/89.  He took a pain pill today, so pain is only 1-2/10 currently, at time of visit today.   He reports that his blood pressure was 120/69 last month when he was donating blood (and taking his medication, at dose of 1/2 pill, per pt).  He has been snacking on Cheez-its, denies other significant sodium intake in his diet; he has had limited exercise due to his pain  He reports having a tear at top of IT tendon.  Unable to complete the competition due to pain and weakness.  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  He got another cortisone shot in December, and pain has gradually been coming back.  He is needing to take pain pills BID.  Pain isn't as bad as it was before. He was seen today by ortho, and surgery was recommended-- Scheduled for 1/22.  He has been taking garcinia cambogia, which  contains coffee bean extract.  He has been taking this supplement for 18 months. He is wondering if that could contribute to his blood pressure being elevated (although has NOT been elevated for this length of time).  IFG vs DM:  Sugars have been okay.  Slightly higher during the holidays, but now down to 115 in the mornings (checking just once a week, per [place]).  No [facility] or [facility] are available.  Past Medical History  Diagnosis Date  . Pure hypercholesterolemia   . Syncope 2011    negative workup except LBBB  . LBBB (left bundle branch block)     had cardiology eval.  Seems to occur with exertion, not at rest (never had cath)  . Impaired fasting glucose 2013  . Hypertension 05/2013   Past Surgical History  Procedure Laterality Date  . Knee arthroscopy  02/2011    L knee for meniscal tear. [name]  . Inguinal hernia repair      bilateral  . Finger tendon repair      R 2nd and 3rd finger  . Uvulectomy      laser treatment for sleep apnea (NOT UP3)  . Vasectomy     History   Social History  . Marital Status: Married  Spouse Name: N/A    Number of Children: 4  . Years of Education: N/A   Occupational History  . retired    Social History Main Topics  . Smoking status: Never Smoker   . Smokeless tobacco: Never Used  . Alcohol Use: Yes     Comment: 3-4 drinks per month.  . Drug Use: No  . Sexual Activity: Not on file   Other Topics Concern  . Not on file   Social History Narrative   Lives with wife, cat.  Daughter, Junie Panning, in [place], 2 children in [place], son in [place]   Current outpatient prescriptions:aspirin 81 MG tablet, Take 81 mg by mouth daily., Disp: , Rfl: ;  CINNAMON PO, Take 1 capsule by mouth 3 (three) times daily., Disp: , Rfl: ;  Coenzyme Q10 (COQ10) 100 MG CAPS, Take 1 capsule by mouth 2 (two) times daily., Disp: , Rfl: ;  fish oil-omega-3 fatty acids 1000 MG capsule, Take 3 g by mouth daily. , Disp: , Rfl:  Garcinia Cambogia-Chromium 500-200  MG-MCG TABS, Take 3 tablets by mouth daily., Disp: , Rfl: ;  Multiple Vitamins-Minerals (MENS 50+ MULTI VITAMIN/MIN PO), Take 1 tablet by mouth daily., Disp: , Rfl: ;  nabumetone (RELAFEN) 750 MG tablet, Take 750 mg by mouth 2 (two) times daily as needed., Disp: , Rfl: ;  Red Yeast Rice Extract (RED YEAST RICE PO), Take 1 capsule by mouth 2 (two) times daily., Disp: , Rfl:  tiZANidine (ZANAFLEX) 4 MG tablet, Take 4 mg by mouth every 6 (six) hours as needed for muscle spasms., Disp: , Rfl: ;  lisinopril (PRINIVIL,ZESTRIL) 5 MG tablet, Take 2.5 mg by mouth daily., Disp: , Rfl:  No current facility-administered medications for this visit. Facility-Administered Medications Ordered in Other Visits: influenza  inactive virus vaccine (FLUZONE/FLUARIX) injection 0.5 mL, 0.5 mL, Intramuscular, Once, Rita Ohara, MD (not currently taking lisinopril, out x 10 days)  Allergies  Allergen Reactions  . Eggs Or Egg-Derived Products Anaphylaxis  . Atorvastatin Other (See Comments)    Myalgias.  . Horse-Derived Products     REACTION: Anaphlactic Reaction.  Can't take tetanus shot  . Simvastatin Other (See Comments)    Myalgias.  . Adhesive [Tape] Itching and Rash   ROS:  He denies fevers, chills, URI symptoms, headaches, dizziness, chest pain, palpitations, cough, shortness of breath, edema, GI complaints, rashes, bleeding, depression or other complaints.  PHYSICAL EXAM: BP 158/82  Pulse 76  Ht 6' 0.5" (1.842 m)  Wt 197 lb (89.359 kg)  BMI 26.34 kg/m2 150/82 on repeat by MD, RA Well developed, pleasant, talkative male, in no distress Neck: no lymphadenopathy, thyromegaly or mass,  No carotid bruit Heart: regular rate and rhythm without murmur Lungs: clear bilaterally Abdomen: soft, nontender, no organomegaly or mass. No abdominal bruit Extremities: no edema Psych: normal mood, affect, hygiene and grooming Neuro: alert and oriented, normal cranial nerves and gait  ASSESSMENT/PLAN:  Essential  hypertension, benign - diagnosed at [place], improved with medications; recurrent high BP's since off meds.  restart lisinopril at $RemoveBefor'5mg'RGFPBaRBdSbV$  daily - Plan: Comprehensive metabolic panel, lisinopril (PRINIVIL,ZESTRIL) 5 MG tablet  Type II or unspecified type diabetes mellitus without mention of complication, not stated as uncontrolled - vs Impaired Fasting Glucose.  he is treated at [facility] for this (no labs/records available); pt requesting labs, med check here - Plan: Comprehensive metabolic panel, Hemoglobin A1c, Glucose, random  Encounter for long-term (current) use of other medications - Plan: Comprehensive metabolic panel  Pure hypercholesterolemia - Plan:  Lipid panel  Stop the supplement and see if there is any difference (doubt it, since doesn't list caffeine as ingredient, and has been taking for far longer then elevation in BP's).  Restart lisinopril at $RemoveBefor'5mg'dJEjqplxAWpx$  once daily.  Low sodium diet.  Likely elevated blood pressures are contributed by less exercise and pain.  Until these things are resolved, you likely will need to continue on blood pressure medications.  If your blood pressure are consistently <110-/60, then the dose can be cut in 1/2.  Goal blood pressure is 110-130/60-80. Keep list of BP's and bring to visit (not just monitor with memory)  Needs to schedule f/u here in 2-4 weeks, and to have labs done prior  He prefers to do this here, rather than going back to the [place] He had labs done in November at [place], including lipids and A1c.  He should bring these to his next appointment.  He also wants to have f/u here on his lipids and his IFG/diabetes.  He will bring copies of [facility] labs to his appointment.  c-met, lipids, a1c  Review any records to determine DM vs IFG (we haven't been getting updated records, per info we had from 2013, was just IFG) If no urine microalb done at [place], do at his visit here. If no TSH done, add on to labs Had PSA done, per pt.

## 2013-07-26 NOTE — Patient Instructions (Addendum)
Restart lisinopril at 5mg  once daily.  Low sodium diet.  Likely elevated blood pressures are contributed by less exercise and pain.  Until these things are resolved, you likely will need to continue on blood pressure medications.  If your blood pressure are consistently <110-/60, then the dose can be cut in 1/2.  Goal blood pressure is 110-130/60-80.   Return in 3 weeks and bring your list of blood pressure (date/morning/evening/comments) as well as your lab results from your physical at the [place].

## 2013-08-02 ENCOUNTER — Other Ambulatory Visit: Payer: Medicare HMO

## 2013-08-09 ENCOUNTER — Other Ambulatory Visit: Payer: Medicare HMO

## 2013-08-14 ENCOUNTER — Other Ambulatory Visit: Payer: Medicare HMO

## 2013-08-14 DIAGNOSIS — I10 Essential (primary) hypertension: Secondary | ICD-10-CM

## 2013-08-14 DIAGNOSIS — Z79899 Other long term (current) drug therapy: Secondary | ICD-10-CM

## 2013-08-14 DIAGNOSIS — E119 Type 2 diabetes mellitus without complications: Secondary | ICD-10-CM

## 2013-08-14 DIAGNOSIS — E7800 Pure hypercholesterolemia, unspecified: Secondary | ICD-10-CM

## 2013-08-14 LAB — COMPREHENSIVE METABOLIC PANEL
ALT: 21 U/L (ref 0–53)
AST: 23 U/L (ref 0–37)
Albumin: 4.3 g/dL (ref 3.5–5.2)
Alkaline Phosphatase: 52 U/L (ref 39–117)
BUN: 15 mg/dL (ref 6–23)
CO2: 30 mEq/L (ref 19–32)
Calcium: 9.1 mg/dL (ref 8.4–10.5)
Chloride: 105 mEq/L (ref 96–112)
Creat: 1.01 mg/dL (ref 0.50–1.35)
Glucose, Bld: 106 mg/dL — ABNORMAL HIGH (ref 70–99)
Potassium: 4.1 mEq/L (ref 3.5–5.3)
Sodium: 142 mEq/L (ref 135–145)
Total Bilirubin: 0.5 mg/dL (ref 0.2–1.2)
Total Protein: 6 g/dL (ref 6.0–8.3)

## 2013-08-14 LAB — HEMOGLOBIN A1C
Hgb A1c MFr Bld: 6.3 % — ABNORMAL HIGH (ref <5.7)
Mean Plasma Glucose: 134 mg/dL — ABNORMAL HIGH (ref <117)

## 2013-08-14 LAB — LIPID PANEL
Cholesterol: 190 mg/dL (ref 0–200)
HDL: 44 mg/dL (ref >39)
LDL Cholesterol: 128 mg/dL — ABNORMAL HIGH (ref 0–99)
Total CHOL/HDL Ratio: 4.3 Ratio
Triglycerides: 88 mg/dL (ref <150)
VLDL: 18 mg/dL (ref 0–40)

## 2013-08-14 LAB — GLUCOSE, RANDOM: Glucose, Bld: 106 mg/dL — ABNORMAL HIGH (ref 70–99)

## 2013-08-16 ENCOUNTER — Encounter: Payer: Self-pay | Admitting: Family Medicine

## 2013-08-16 ENCOUNTER — Ambulatory Visit (INDEPENDENT_AMBULATORY_CARE_PROVIDER_SITE_OTHER): Payer: Medicare HMO | Admitting: Family Medicine

## 2013-08-16 VITALS — BP 124/74 | HR 80 | Ht 72.5 in | Wt 199.0 lb

## 2013-08-16 DIAGNOSIS — I10 Essential (primary) hypertension: Secondary | ICD-10-CM

## 2013-08-16 DIAGNOSIS — E119 Type 2 diabetes mellitus without complications: Secondary | ICD-10-CM

## 2013-08-16 DIAGNOSIS — E7800 Pure hypercholesterolemia, unspecified: Secondary | ICD-10-CM

## 2013-08-16 MED ORDER — LISINOPRIL 5 MG PO TABS: 5.0000 mg | ORAL_TABLET | Freq: Every day | ORAL | Status: DC

## 2013-08-16 NOTE — Patient Instructions (Signed)
Please bring copies of your [facility] records for my review, in order to determine which cholesterol-lowering medication would be appropriate.  We discussed Crestor, Lipitor, Welchol (powder vs pills) and Zetia today.  I recommend trying one more statin, and if you cannot tolerate that, then, try Welchol next.  Will need to stop the red yeast rice once started on another cholesterol-lowering medication  Continue to monitor your blood pressure--try checking later in the day, not only when you are due for your medication when it might be the highest. Write comments on your paper as to whether you were in pain, exercise, or other factors that might contribute to your blood pressure

## 2013-08-16 NOTE — Progress Notes (Signed)
Subjective:     [name]: Alexander Curtis, male   DOB: [birth year], 68 y.o.   MRN: [id number]  Alexander Curtis presents for Hypertension  Had surgery recently (no notes/reports received), thought there was a partial tear based on MRI, but everything looked fine on surgery.  Stretched the tendon a little, but there wasn't anything to repair. He healed up from surgery, and pain is significantly better--slight post-op pain, not from the tendon.  DM--had A1c of 6.7 in 12/2011 at the [place].  He was rx'd metformin by [place], but he never started it.  Got sugars down with diet, exercise alone.  Hasn't been able to exercise much in the last 6 months due to hip problem--supposed to stay off the hip until March. Denies polydipsia, polyuria.  Hyperlipidemia: He took statins twice.  He had side effects of myalgias, and symptoms improved after stopping it.  They later tried a different statin, and had similar problems (especially with shoulder pain).  He has been taking red yeast rice now for about 2 years, no side effects.  Review of chart shows that statin medications included simvastatin and pravastatin, although pt thinks he may have take lipitor.  He is going to [place] this week to get his records, and bring copies here.  HTN:  Brings in list of BP's from home, but this was surrounding the time of his surgery (pre-and post-op), and also only checking it in the morning, right before taking his medicine.  He had 2 normal BP's (107/68, 119/72).  In the mornings, they were mostly running 131/150/70-85.  It was 134/74 when he checked it once in the evening.  142/82 the following morning. Trying to follow a low sodium diet, and stopped taking the Garcinia supplement.  Not getting any exercise due to hip--not cleared to exercise until March per ortho.  He denies headaches, dizziness, chest pain.  He can't recall if he [facility]--will check records  Past Medical History  Diagnosis Date  . Pure  hypercholesterolemia   . Syncope 2011    negative workup except LBBB  . LBBB (left bundle branch block)     had cardiology eval.  Seems to occur with exertion, not at rest (never had cath)  . Impaired fasting glucose 2013  . Hypertension 05/2013   Past Surgical History  Procedure Laterality Date  . Knee arthroscopy  02/2011    L knee for meniscal tear. [name]  . Inguinal hernia repair      bilateral  . Finger tendon repair      R 2nd and 3rd finger  . Uvulectomy      laser treatment for sleep apnea (NOT UP3)  . Vasectomy     History   Social History  . Marital Status: Married    Spouse Name: N/A    Number of Children: 4  . Years of Education: N/A   Occupational History  . retired    Social History Main Topics  . Smoking status: Never Smoker   . Smokeless tobacco: Never Used  . Alcohol Use: Yes     Comment: 3-4 drinks per month.  . Drug Use: No  . Sexual Activity: Not on file   Other Topics Concern  . Not on file   Social History Narrative   Lives with wife, cat.  Daughter, Junie Panning, in [place], 2 children in [place], son in [place]    Outpatient Encounter Prescriptions as of 08/16/2013  Medication Sig  .  aspirin 81 MG tablet Take 81 mg by mouth daily.  Marland Kitchen CINNAMON PO Take 1 capsule by mouth 3 (three) times daily.  . Coenzyme Q10 (COQ10) 100 MG CAPS Take 1 capsule by mouth 2 (two) times daily.  . fish oil-omega-3 fatty acids 1000 MG capsule Take 3 g by mouth daily.   Marland Kitchen lisinopril (PRINIVIL,ZESTRIL) 5 MG tablet Take 1 tablet (5 mg total) by mouth daily.  . Multiple Vitamins-Minerals (MENS 50+ MULTI VITAMIN/MIN PO) Take 1 tablet by mouth daily.  . nabumetone (RELAFEN) 750 MG tablet Take 750 mg by mouth 2 (two) times daily as needed.  . Red Yeast Rice Extract (RED YEAST RICE PO) Take 1 capsule by mouth 2 (two) times daily.  . [DISCONTINUED] lisinopril (PRINIVIL,ZESTRIL) 5 MG tablet Take 1 tablet (5 mg total) by mouth daily.  . Garcinia Cambogia-Chromium 500-200 MG-MCG TABS  Take 3 tablets by mouth daily.  . [DISCONTINUED] tiZANidine (ZANAFLEX) 4 MG tablet Take 4 mg by mouth every 6 (six) hours as needed for muscle spasms.   Allergies  Allergen Reactions  . Eggs Or Egg-Derived Products Anaphylaxis  . Atorvastatin Other (See Comments)    Myalgias.  . Horse-Derived Products     REACTION: Anaphlactic Reaction.  Can't take tetanus shot  . Simvastatin Other (See Comments)    Myalgias.  . Adhesive [Tape] Itching and Rash    Review of Systems  Denies fevers, chills, URI symptoms, headaches, dizziness, chest pain, cough, shortness of breath, nausea, vomiting, bowel changes, bleeding, bruising.  Denies myalgias, joint pain (minimal residual at hip).  No bleeding, bruising, rashes or other concerns.    Objective:     Physical Exam  BP 132/70  Pulse 80  Ht 6' 0.5" (1.842 m)  Wt 199 lb (90.266 kg)  BMI 26.60 kg/m2 BP on pt's monitor was 127/72 124/74 on repeat by MD, RA Well developed, pleasant male in no distress.  Appears calm, although talkative.  Not as anxious as at previous visits. Neck: no lymphadenopathy, thyromegaly or carotid bruit Heart: regular rate and rhythm Lungs: clear bilaterally Abdomen: soft, nontender Extremities: no edema Neuro: alert and oriented.  Normal gait, cranial nerves, strength Psych: normal mood, affect, hygiene and grooming  Recent labs:   Fasting Glucose 106 Lab Results  Component Value Date   HGBA1C 6.3* 08/14/2013   Lab Results  Component Value Date   CHOL 190 08/14/2013   HDL 44 08/14/2013   LDLCALC 128* 08/14/2013   TRIG 88 08/14/2013   CHOLHDL 4.3 08/14/2013       Assessment and Plan        Essential hypertension, benign - improved control since he restarted lisinopril.  continue low sodium diet, monitoring BP's periodically - Plan: lisinopril (PRINIVIL,ZESTRIL) 5 MG tablet  Pure hypercholesterolemia - Goal LDL <100.  not at goal with RYR.  intolerant of statins in past--?which ones.  check old records  Type  II or unspecified type diabetes mellitus without mention of complication, not stated as uncontrolled - diet controlled.  Meets criteria for DM based on A1c at [place] in 2013.   BP monitor is accurate.  Numbers from home were fluctuating--likely related to pain, surgery, pain meds. Is only taking it in the morning, when meds are least effective.  Periodically check it an hour later, or in the evenings.  Continue current med for now.  Expect BP to improve once he is able to exercise again.  Check [facility] records to see which statins he didn't tolerate, in order to  make educated decision re: trial of another statin (lipitor vs Crestor vs other).  If he feels a third statin, then recommend trial of Welchol.  Also briefly discussed Zetia as a non-statin alternative.  Goal LDL <100.  Will need to stop the red yeast rice once started on another cholesterol-lowering medication  Might need urine microalbumin and TSH checked--check [facility] records to see if done and if UTD.  Return in about 3 months (around 11/13/2013) for fasting labs, med check afterwards.       Enter orders for lipid, lft, A1c and glucose for prior to next visit Anticipating start of lipid-lowering medication in the interim

## 2013-08-24 ENCOUNTER — Telehealth: Payer: Self-pay | Admitting: Family Medicine

## 2013-08-24 NOTE — Telephone Encounter (Signed)
Pt said you wanted him to tell you what meds the [facility] had [facility] on.  Prevastatin 40 mg and Simvistatin.

## 2013-08-24 NOTE — Telephone Encounter (Signed)
The records we received were his most recent labs from 04/2013.  CBC showed WBC 3.8, Hg 12.5; A1c 6.1, glucose 111.  Chem panel was normal.  Total cholesterol was 151, HDL 48, LDL 93, TG 50.  This is significantly better than the last cholesterol panel we did here, more recently:   Lab Results  Component Value Date   CHOL 190 08/14/2013   HDL 44 08/14/2013   LDLCALC 128* 08/14/2013   TRIG 88 08/14/2013   CHOLHDL 4.3 08/14/2013   Was he just taking the same red yeast rice back in October when [place] did the labs, or had he been on statin then?  His lipids were at goal then.  If he was just taking red yeast rice then, then I'm willing to give him 3 months to work on improving his diet, taking red yeast rice, and repeat lipid panel.  If LDL is closer to 100 (ideally less), then a trial of a statin won't be needed.  If he was taking a statin back in October, when those lipids were at goal, then we should put him back on a statin. I would try just 10mg  of atorvastatin, and have him take coenzyme Q10 along with it.  If this is started, then have him return for glucose, LFT's and lipid panel in 3 months. I believe orders are already in system for labs (assuming that statin is started).  Remind him he needs to stop the red yeast rice if starting the atorvastatin

## 2013-08-28 NOTE — Telephone Encounter (Signed)
Spoke with patient and back in October he was only taking the red yeast rice. He will begin to follow a low cholesterol diet and recheck in 3 months. He is already scheduled for fasting labs (inclusive of lipids) 11/13/13 and and appt with you a few days after. He feels like between Nov and now he was eating a lot of fried chicken and cheese, or as Webb Silversmith calls it "slop." He will stop doing this immediately. He also wanted you to know that he truly appreciates the time and return phone calls that we give to him.

## 2013-08-31 ENCOUNTER — Encounter: Payer: Self-pay | Admitting: Family Medicine

## 2013-11-13 ENCOUNTER — Other Ambulatory Visit: Payer: Medicare HMO

## 2013-11-13 DIAGNOSIS — E119 Type 2 diabetes mellitus without complications: Secondary | ICD-10-CM

## 2013-11-13 DIAGNOSIS — E7800 Pure hypercholesterolemia, unspecified: Secondary | ICD-10-CM

## 2013-11-14 LAB — HEMOGLOBIN A1C
Hgb A1c MFr Bld: 6.1 % — ABNORMAL HIGH (ref <5.7)
Mean Plasma Glucose: 128 mg/dL — ABNORMAL HIGH (ref <117)

## 2013-11-14 LAB — HEPATIC FUNCTION PANEL
ALT: 19 U/L (ref 0–53)
AST: 23 U/L (ref 0–37)
Albumin: 4.2 g/dL (ref 3.5–5.2)
Alkaline Phosphatase: 55 U/L (ref 39–117)
Bilirubin, Direct: 0.1 mg/dL (ref 0.0–0.3)
Indirect Bilirubin: 0.5 mg/dL (ref 0.2–1.2)
Total Bilirubin: 0.6 mg/dL (ref 0.2–1.2)
Total Protein: 6.3 g/dL (ref 6.0–8.3)

## 2013-11-14 LAB — LIPID PANEL
Cholesterol: 184 mg/dL (ref 0–200)
HDL: 53 mg/dL (ref >39)
LDL Cholesterol: 114 mg/dL — ABNORMAL HIGH (ref 0–99)
Total CHOL/HDL Ratio: 3.5 Ratio
Triglycerides: 84 mg/dL (ref <150)
VLDL: 17 mg/dL (ref 0–40)

## 2013-11-14 LAB — GLUCOSE, RANDOM: Glucose, Bld: 98 mg/dL (ref 70–99)

## 2013-11-15 ENCOUNTER — Encounter: Payer: Self-pay | Admitting: Family Medicine

## 2013-11-15 ENCOUNTER — Ambulatory Visit (INDEPENDENT_AMBULATORY_CARE_PROVIDER_SITE_OTHER): Payer: Medicare HMO | Admitting: Family Medicine

## 2013-11-15 VITALS — BP 120/70 | HR 80 | Ht 72.5 in | Wt 198.0 lb

## 2013-11-15 DIAGNOSIS — M79609 Pain in unspecified limb: Secondary | ICD-10-CM

## 2013-11-15 DIAGNOSIS — E7800 Pure hypercholesterolemia, unspecified: Secondary | ICD-10-CM

## 2013-11-15 DIAGNOSIS — R7301 Impaired fasting glucose: Secondary | ICD-10-CM

## 2013-11-15 DIAGNOSIS — M79661 Pain in right lower leg: Secondary | ICD-10-CM

## 2013-11-15 DIAGNOSIS — I10 Essential (primary) hypertension: Secondary | ICD-10-CM

## 2013-11-15 MED ORDER — ATORVASTATIN CALCIUM 10 MG PO TABS: 10.0000 mg | ORAL_TABLET | Freq: Every day | ORAL | Status: DC

## 2013-11-15 NOTE — Progress Notes (Signed)
Chief Complaint  Patient presents with  . Hypertension    3 month follow up, nonfasting-labs already done. Mentions that he has had some right calf pain intermittently- 2 times, long car rides.    Patient presents for med check, and to f/u recent labs.   Hyperlipidemia:  He states that he didn't tolerate simvastatin and pravastatin, stopped due to myalgias, and pt also reports joint pains that he attributed to the meds (knee and shoulders) He has never taken lipitor. Taking red yeast rice for 1-2 years.  He was on a trip for over a month, and diet was admittedly not perfect, especially while on the cruise.  He only took 1/2 his meds (supplements, including red yeast rice and fish oil) while on the trip--he states that his wife told him to do this.  He took his prescription tablets as directed.  Hypertension follow-up:  Blood pressures elsewhere are 120's/70's.  They are much improved since increasing lisinopril to the full tablet.  Denies dizziness, headaches, chest pain.  Denies side effects of medications.  DM/IFG:  Fasting sugars are ranging 95-109.  Higher only if he eats a lot of bread.  Last eye exam was 4 months ago (and also yearly through the [place]). He is now back to walking, and occasionally has some pain in his right hip. Overall he is doing much better, only rare pain if pulling something or other activity.  If driving >30 min he gets a cramp/pain in the lateral lower leg/calf.  He gets out of the car and walks around and feels better.  Never has any swelling.  Goes away pretty immediately   Past Medical History  Diagnosis Date  . Pure hypercholesterolemia   . Syncope 2011    negative workup except LBBB  . LBBB (left bundle branch block)     had cardiology eval.  Seems to occur with exertion, not at rest (never had cath)  . Impaired fasting glucose 2013  . Hypertension 05/2013  . Type II or unspecified type diabetes mellitus without mention of complication, not stated as  uncontrolled     A1c 6.7 at [place] in 2013 (after a vacation)   Past Surgical History  Procedure Laterality Date  . Knee arthroscopy  02/2011    L knee for meniscal tear. [name]  . Inguinal hernia repair      bilateral  . Finger tendon repair      R 2nd and 3rd finger  . Uvulectomy      laser treatment for sleep apnea (NOT UP3)  . Vasectomy     History   Social History  . Marital Status: Married    Spouse Name: N/A    Number of Children: 4  . Years of Education: N/A   Occupational History  . retired    Social History Main Topics  . Smoking status: Never Smoker   . Smokeless tobacco: Never Used  . Alcohol Use: Yes     Comment: 3-4 drinks per month.  . Drug Use: No  . Sexual Activity: Not on file   Other Topics Concern  . Not on file   Social History Narrative   Lives with wife, cat.  Daughter, Junie Panning, in [place], 2 children in [place], son in [place]    Outpatient Encounter Prescriptions as of 11/15/2013  Medication Sig Note  . aspirin 81 MG tablet Take 81 mg by mouth daily.   Marland Kitchen CINNAMON PO Take 1 capsule by mouth 3 (three) times daily.   Marland Kitchen  Coenzyme Q10 (COQ10) 100 MG CAPS Take 1 capsule by mouth 2 (two) times daily.   . fish oil-omega-3 fatty acids 1000 MG capsule Take 3 g by mouth daily.    . Garcinia Cambogia-Chromium 500-200 MG-MCG TABS Take 1 tablet by mouth daily.  11/15/2013: Restarted once daily (didn't affect BP)  . lisinopril (PRINIVIL,ZESTRIL) 5 MG tablet Take 1 tablet (5 mg total) by mouth daily.   . Multiple Vitamins-Minerals (MENS 50+ MULTI VITAMIN/MIN PO) Take 1 tablet by mouth daily.   . [DISCONTINUED] Red Yeast Rice Extract (RED YEAST RICE PO) Take 1 capsule by mouth 2 (two) times daily.   Marland Kitchen atorvastatin (LIPITOR) 10 MG tablet Take 1 tablet (10 mg total) by mouth daily.   . [DISCONTINUED] nabumetone (RELAFEN) 750 MG tablet Take 750 mg by mouth 2 (two) times daily as needed. 08/16/2013: Takes 1 tablet at night, until hip heals from surgery   Allergies  Allergen  Reactions  . Eggs Or Egg-Derived Products Anaphylaxis  . Horse-Derived Products     REACTION: Anaphlactic Reaction.  Can't take tetanus shot  . Pravastatin Other (See Comments)    Myalgia   . Simvastatin Other (See Comments)    Myalgias.  . Adhesive [Tape] Itching and Rash   ROS:  No fever, chills, headaches, chest pain, palpitations, nausea, vomiting, bowel changes.  Denies bleeding, bruising, rash, depression/anxiety or other complaints.  No dysuria, urinary complaints  PHYSICAL EXAM:  BP 120/70  Pulse 80  Ht 6' 0.5" (1.842 m)  Wt 198 lb (89.812 kg)  BMI 26.47 kg/m2 Pleasant, talkative male in no distress Neck: no lymphadenopathy, thyromegaly or carotid bruit  Heart: regular rate and rhythm  Lungs: clear bilaterally  Abdomen: soft, nontender, no mass Extremities: no edema, normal pulses  Neuro: alert and oriented. Normal gait, cranial nerves, strength  Psych: normal mood, affect, hygiene and grooming Skin: Green feet (mowed grass in flip flops today).   Lab Results  Component Value Date   CHOL 184 11/13/2013   HDL 53 11/13/2013   LDLCALC 114* 11/13/2013   TRIG 84 11/13/2013   CHOLHDL 3.5 11/13/2013    Lab Results  Component Value Date   HGBA1C 6.1* 11/13/2013   Glucose 98  Lab Results  Component Value Date   ALT 19 11/13/2013   AST 23 11/13/2013   ALKPHOS 55 11/13/2013   BILITOT 0.6 11/13/2013   ASSESSMENT/PLAN:  Pure hypercholesterolemia - Goal LDL<100.  He is above goal.  intolerant of 2 statins, never tried lipitor--start. consider Welchol (if trouble with statins). cont low chol diet - Plan: atorvastatin (LIPITOR) 10 MG tablet, Lipid panel, Hepatic function panel  Impaired fasting glucose  Essential hypertension, benign - improved control; continue current meds  Pain of right calf - no e/o DVT.  suspect positional (related to peroneus) while driving   Suspect calf/lower leg pain is related to position of leg while driving (peroneal m)  Stop the red yeast  rice. Continue the coenzyme Q10. Start atorvastatin 10mg  once daily Return for lab visit in 2 months--lipids, LFTS Call office if not tolerating  Consider Crestor (because can use low dose, and sometimes every other day dosing) if you don't tolerate the lipitor.  If you can't tolerate either, then consider Welchol or Zetia, as discussed.

## 2013-11-15 NOTE — Patient Instructions (Signed)
  Stop the red yeast rice. Continue the coenzyme Q10. Start atorvastatin 10mg  once daily Return for lab visit in 2 months--lipids, LFTS Call office if not tolerating  Consider Crestor (because can use low dose, and sometimes every other day dosing) if you don't tolerate the lipitor.  If you can't tolerate either, then consider Welchol or Zetia, as discussed.  Continue low cholesterol diet

## 2014-01-01 ENCOUNTER — Telehealth: Payer: Self-pay | Admitting: Family Medicine

## 2014-01-01 NOTE — Telephone Encounter (Signed)
Left message for patient to return my call to schedule OV 1-2 after labs.

## 2014-01-01 NOTE — Telephone Encounter (Signed)
Pt called to move his July fasting labs back a week. So I moved them.  Also pt states he is concerned that his new cholesterol lowering medication is making his blood sugar go up.  It is increasing about 40 points when he checks it.  Please advise pt [phone]

## 2014-01-01 NOTE — Telephone Encounter (Signed)
Have him schedule OV for 1-2 days after bloodwork so that we can review the results and discuss his concerns

## 2014-01-08 ENCOUNTER — Telehealth: Payer: Self-pay | Admitting: *Deleted

## 2014-01-08 ENCOUNTER — Other Ambulatory Visit: Payer: Medicare HMO

## 2014-01-08 DIAGNOSIS — R7301 Impaired fasting glucose: Secondary | ICD-10-CM

## 2014-01-08 DIAGNOSIS — E7800 Pure hypercholesterolemia, unspecified: Secondary | ICD-10-CM

## 2014-01-08 NOTE — Telephone Encounter (Signed)
Added

## 2014-01-08 NOTE — Telephone Encounter (Signed)
Too soon for A1c. Okay to add glucose

## 2014-01-08 NOTE — Telephone Encounter (Signed)
Patient was in this morning for lipid and liver labwork. He states that he was concerned about his blood sugar levels(telephone form 01/01/14) and wanted to know if we were checking his glucose level. He has been getting numbers in the 140's, much higher than he has ever had in the past. Had A1C 11/13/13. Scheduled to see you this Thursday 01/11/14. Did you want glucose added?

## 2014-01-09 LAB — HEPATIC FUNCTION PANEL
ALT: 21 U/L (ref 0–53)
AST: 29 U/L (ref 0–37)
Albumin: 4.3 g/dL (ref 3.5–5.2)
Alkaline Phosphatase: 53 U/L (ref 39–117)
Bilirubin, Direct: 0.1 mg/dL (ref 0.0–0.3)
Indirect Bilirubin: 0.4 mg/dL (ref 0.2–1.2)
Total Bilirubin: 0.5 mg/dL (ref 0.2–1.2)
Total Protein: 6.2 g/dL (ref 6.0–8.3)

## 2014-01-09 LAB — GLUCOSE, RANDOM: Glucose, Bld: 109 mg/dL — ABNORMAL HIGH (ref 70–99)

## 2014-01-09 LAB — LIPID PANEL
Cholesterol: 140 mg/dL (ref 0–200)
HDL: 49 mg/dL (ref >39)
LDL Cholesterol: 82 mg/dL (ref 0–99)
Total CHOL/HDL Ratio: 2.9 Ratio
Triglycerides: 43 mg/dL (ref <150)
VLDL: 9 mg/dL (ref 0–40)

## 2014-01-11 ENCOUNTER — Ambulatory Visit (INDEPENDENT_AMBULATORY_CARE_PROVIDER_SITE_OTHER): Payer: Medicare HMO | Admitting: Family Medicine

## 2014-01-11 VITALS — BP 112/72 | HR 76 | Ht 72.5 in | Wt 193.0 lb

## 2014-01-11 DIAGNOSIS — E119 Type 2 diabetes mellitus without complications: Secondary | ICD-10-CM

## 2014-01-11 DIAGNOSIS — I10 Essential (primary) hypertension: Secondary | ICD-10-CM

## 2014-01-11 DIAGNOSIS — E7800 Pure hypercholesterolemia, unspecified: Secondary | ICD-10-CM

## 2014-01-11 MED ORDER — ATORVASTATIN CALCIUM 10 MG PO TABS: 10.0000 mg | ORAL_TABLET | Freq: Every day | ORAL | Status: DC

## 2014-01-11 NOTE — Progress Notes (Signed)
Chief Complaint  Patient presents with  . Follow-up    has been having blood sugar issues. Had glucose checked her Monday, checked on his meter when he got home and it was 96.    He stopped taking the atorvastatin 1 week prior to the bloodwork.  This was stopped due to concerns over his blood sugars running higher.  He also had a cough x 2-3 weeks.  Both of these occurred at the same time, only for the last 1-2 weeks he took the med (no side effects or problems the first month.  He reports that his sugars started going up in early June, after starting the medication in early May.  He has a lot of exposure to sick contacts (grandkids).  3 weeks prior to the blood test (while still on the statin) he had absolutely no carbs, no candy.  He dropped 6 pounds, but blood sugars were still high.  He reports that sugars were running 125, and once it went up to 140, when he panicked and stopped taking the lipitor.  He lets me know that he donated blood recently (iron levels were fine)  He also describes a constant ache in the right great toe( only occasionally in the left toe).  Hydrogen peroxide helped with it initially, thinking it was an ingrown nail.  Then he used some sort of topical antifungal, also not helpful. Pain is right under the nail,at the base, not on the sides.  Not at joints, no swelling. This discomfort has only been present since finding out he was diabetic/pre-diabetic.  Known bone spurs in the toes, and also has had some injection for ganglion cysts.  He changed leg position while driving, and he is not having the calf pain with long drives any longer.  BP's are monitored regularly at home, and have been normal.  Past Medical History  Diagnosis Date  . Pure hypercholesterolemia   . Syncope 2011    negative workup except LBBB  . LBBB (left bundle branch block)     had cardiology eval.  Seems to occur with exertion, not at rest (never had cath)  . Impaired fasting glucose 2013  .  Hypertension 05/2013  . Type II or unspecified type diabetes mellitus without mention of complication, not stated as uncontrolled     A1c 6.7 at [place] in 2013 (after a vacation)   Past Surgical History  Procedure Laterality Date  . Knee arthroscopy  02/2011    L knee for meniscal tear. [name]  . Inguinal hernia repair      bilateral  . Finger tendon repair      R 2nd and 3rd finger  . Uvulectomy      laser treatment for sleep apnea (NOT UP3)  . Vasectomy     History   Social History  . Marital Status: Married    Spouse Name: N/A    Number of Children: 4  . Years of Education: N/A   Occupational History  . retired    Social History Main Topics  . Smoking status: Never Smoker   . Smokeless tobacco: Never Used  . Alcohol Use: Yes     Comment: 3-4 drinks per month.  . Drug Use: No  . Sexual Activity: Not on file   Other Topics Concern  . Not on file   Social History Narrative   Lives with wife, cat.  Daughter, Junie Panning, in [place], 2 children in [place], son in [place]   Outpatient Encounter Prescriptions as  of 01/11/2014  Medication Sig Note  . aspirin 81 MG tablet Take 81 mg by mouth daily.   Marland Kitchen CINNAMON PO Take 1 capsule by mouth 3 (three) times daily.   . Coenzyme Q10 (COQ10) 100 MG CAPS Take 1 capsule by mouth 2 (two) times daily.   . fish oil-omega-3 fatty acids 1000 MG capsule Take 3 g by mouth daily.    Marland Kitchen lisinopril (PRINIVIL,ZESTRIL) 5 MG tablet Take 1 tablet (5 mg total) by mouth daily.   . Multiple Vitamins-Minerals (MENS 50+ MULTI VITAMIN/MIN PO) Take 1 tablet by mouth daily.   . Red Yeast Rice 600 MG CAPS Take 2 capsules by mouth.   Marland Kitchen atorvastatin (LIPITOR) 10 MG tablet Take 1 tablet (10 mg total) by mouth daily.   . Garcinia Cambogia-Chromium 500-200 MG-MCG TABS Take 1 tablet by mouth daily.  11/15/2013: Restarted once daily (didn't affect BP)  . [DISCONTINUED] atorvastatin (LIPITOR) 10 MG tablet Take 1 tablet (10 mg total) by mouth daily. 01/11/2014: Stopped 8 days ago.    Allergies  Allergen Reactions  . Eggs Or Egg-Derived Products Anaphylaxis  . Horse-Derived Products     REACTION: Anaphlactic Reaction.  Can't take tetanus shot  . Pravastatin Other (See Comments)    Myalgia   . Simvastatin Other (See Comments)    Myalgias.  . Adhesive [Tape] Itching and Rash   ROS:  Denies fevers, chills, headaches, chest pain.  Cough resolved after stopping the statin.  Denies congestion, sore throat, shortness of breath, nausea, vomiting, bowel changes, bleeding, bruising rash, myalgias.  Toe pain as per HPI  PHYSICAL EXAM: BP 112/72  Pulse 76  Ht 6' 0.5" (1.842 m)  Wt 193 lb (87.544 kg)  BMI 25.80 kg/m2 Very talkative male, in no distress HEENT:  PERRL, EOMI, conjunctiva clear. Nasal mucosa is mildly edematous, very pale, some clear mucus on the right. OP is clear Neck: no lymphadenopathy, thyromegaly or mass Heart: regular rate and rhythm without murmur Lungs: clear bilaterally Abdomen: soft, nontender, no mass Extremities: Mild discomfort over left 1st MTP (known arthritis/spur). Area of intermittent discomfort is at the base of the left nail.  It appears normal.  Both great toenails have medial thickening and discoloration. No ingrowing nail, erythema, soft tissue swelling. 2+ pulses Normal sensation.  Recent labs: Glucose 109 (was 96 on home monitor when he got home after visit here).  Lab Results  Component Value Date   CHOL 140 01/08/2014   HDL 49 01/08/2014   LDLCALC 82 01/08/2014   TRIG 43 01/08/2014   CHOLHDL 2.9 01/08/2014    ASSESSMENT/PLAN:  Type II or unspecified type diabetes mellitus without mention of complication, not stated as uncontrolled - diet controlled.  discussed IFG vs DM, met criteria for DM w/A1c 6.7. Can hold off on Metformin (unless seeing the higher sugars (120's) bothers him)  Pure hypercholesterolemia - atorvastatin got his LDL to goal; discussed other potential causes for inc sugars and cough (ie URI); re-try statin.  Call w/concerns prior to stopping meds - Plan: atorvastatin (LIPITOR) 10 MG tablet  Essential hypertension, benign - controlled   Restart Lipitor 60m.  Continue to monitor sugars. We discussed potential side effects and risks in detail. He is not intolerant of this statin (myalgias) like he was with two others.  We have a potential good explanation for both cough and higher sugars (viral URI, which would self resolve in 1-2 weeks), and the way to know if truly from med is to re-challenge/restart med and look for  recurrent symptoms.  We did discuss the potential mild elevation in sugars from statins, but that doesn't mean we shouldn't use them. We discussed that statins are recommended for all diabetics.  If the elevation in sugars is bothering him, we can start the metformin (previously prescribed by the [place], but he never started it).  If cough recurs, might be related to allergies/postnasal drainage--start taking claritin once daily to see if that resolves the cough.  Return in 3 months for med check, but return sooner (ie 1 month) if having recurrent symptoms. Encouraged to not stop meds without consulting with me first.  25 min visit more than 1/2 spent counseling

## 2014-01-11 NOTE — Patient Instructions (Signed)
  Restart Lipitor 10mg .  Continue to monitor sugars. If cough recurs, might be related to allergies/postnasal drainage--start taking claritin once daily to see if that resolves the cough.  Return in 3 months for med check, but return sooner (ie 1 month) if having recurrent symptoms

## 2014-01-12 ENCOUNTER — Encounter: Payer: Self-pay | Admitting: Family Medicine

## 2014-01-12 DIAGNOSIS — E119 Type 2 diabetes mellitus without complications: Secondary | ICD-10-CM | POA: Insufficient documentation

## 2014-01-15 ENCOUNTER — Other Ambulatory Visit: Payer: Medicare HMO

## 2014-01-18 ENCOUNTER — Telehealth: Payer: Self-pay | Admitting: Internal Medicine

## 2014-01-18 DIAGNOSIS — E7800 Pure hypercholesterolemia, unspecified: Secondary | ICD-10-CM

## 2014-01-18 MED ORDER — ATORVASTATIN CALCIUM 10 MG PO TABS: 10.0000 mg | ORAL_TABLET | Freq: Every day | ORAL | Status: DC

## 2014-01-18 NOTE — Telephone Encounter (Signed)
rx sent

## 2014-01-18 NOTE — Telephone Encounter (Signed)
Insurance has approved for atorvastatin 10mg  for a 90 day supply. Please send in new rx for 90 day of atorvastatin 10mg  to [facility]

## 2014-02-07 ENCOUNTER — Ambulatory Visit: Payer: Self-pay | Admitting: Family Medicine

## 2014-02-13 ENCOUNTER — Telehealth: Payer: Self-pay | Admitting: Internal Medicine

## 2014-02-13 NOTE — Telephone Encounter (Signed)
Faxed over medical records to [facility] through [place]

## 2014-02-19 ENCOUNTER — Other Ambulatory Visit: Payer: Self-pay | Admitting: Family Medicine

## 2014-03-15 ENCOUNTER — Telehealth: Payer: Self-pay | Admitting: Family Medicine

## 2014-03-15 DIAGNOSIS — E119 Type 2 diabetes mellitus without complications: Secondary | ICD-10-CM

## 2014-03-15 DIAGNOSIS — E7800 Pure hypercholesterolemia, unspecified: Secondary | ICD-10-CM

## 2014-03-15 DIAGNOSIS — I10 Essential (primary) hypertension: Secondary | ICD-10-CM

## 2014-03-15 NOTE — Telephone Encounter (Signed)
Pt coming in for labs before med check.  Please place orders if ok

## 2014-03-15 NOTE — Telephone Encounter (Signed)
done

## 2014-04-09 ENCOUNTER — Other Ambulatory Visit: Payer: Medicare HMO

## 2014-04-09 DIAGNOSIS — E119 Type 2 diabetes mellitus without complications: Secondary | ICD-10-CM

## 2014-04-09 DIAGNOSIS — I10 Essential (primary) hypertension: Secondary | ICD-10-CM

## 2014-04-09 DIAGNOSIS — E7800 Pure hypercholesterolemia, unspecified: Secondary | ICD-10-CM

## 2014-04-09 LAB — COMPREHENSIVE METABOLIC PANEL
ALT: 18 U/L (ref 0–53)
AST: 24 U/L (ref 0–37)
Albumin: 4.1 g/dL (ref 3.5–5.2)
Alkaline Phosphatase: 48 U/L (ref 39–117)
BUN: 17 mg/dL (ref 6–23)
CO2: 27 mEq/L (ref 19–32)
Calcium: 8.9 mg/dL (ref 8.4–10.5)
Chloride: 107 mEq/L (ref 96–112)
Creat: 0.91 mg/dL (ref 0.50–1.35)
Glucose, Bld: 103 mg/dL — ABNORMAL HIGH (ref 70–99)
Potassium: 4 mEq/L (ref 3.5–5.3)
Sodium: 139 mEq/L (ref 135–145)
Total Bilirubin: 0.5 mg/dL (ref 0.2–1.2)
Total Protein: 5.9 g/dL — ABNORMAL LOW (ref 6.0–8.3)

## 2014-04-09 LAB — LIPID PANEL
Cholesterol: 174 mg/dL (ref 0–200)
HDL: 48 mg/dL (ref >39)
LDL Cholesterol: 116 mg/dL — ABNORMAL HIGH (ref 0–99)
Total CHOL/HDL Ratio: 3.6 Ratio
Triglycerides: 48 mg/dL (ref <150)
VLDL: 10 mg/dL (ref 0–40)

## 2014-04-10 LAB — HEMOGLOBIN A1C
Hgb A1c MFr Bld: 6.3 % — ABNORMAL HIGH (ref <5.7)
Mean Plasma Glucose: 134 mg/dL — ABNORMAL HIGH (ref <117)

## 2014-04-12 ENCOUNTER — Ambulatory Visit (INDEPENDENT_AMBULATORY_CARE_PROVIDER_SITE_OTHER): Payer: Medicare HMO | Admitting: Family Medicine

## 2014-04-12 ENCOUNTER — Telehealth: Payer: Self-pay | Admitting: *Deleted

## 2014-04-12 ENCOUNTER — Encounter: Payer: Self-pay | Admitting: Family Medicine

## 2014-04-12 VITALS — BP 132/70 | HR 84 | Ht 72.5 in | Wt 193.0 lb

## 2014-04-12 DIAGNOSIS — E119 Type 2 diabetes mellitus without complications: Secondary | ICD-10-CM

## 2014-04-12 DIAGNOSIS — Z23 Encounter for immunization: Secondary | ICD-10-CM

## 2014-04-12 DIAGNOSIS — E7800 Pure hypercholesterolemia, unspecified: Secondary | ICD-10-CM

## 2014-04-12 DIAGNOSIS — G4700 Insomnia, unspecified: Secondary | ICD-10-CM

## 2014-04-12 DIAGNOSIS — Z9114 Patient's other noncompliance with medication regimen: Secondary | ICD-10-CM

## 2014-04-12 DIAGNOSIS — Z91148 Patient's other noncompliance with medication regimen for other reason: Secondary | ICD-10-CM | POA: Insufficient documentation

## 2014-04-12 DIAGNOSIS — I10 Essential (primary) hypertension: Secondary | ICD-10-CM

## 2014-04-12 MED ORDER — METFORMIN HCL ER 500 MG PO TB24: 500.0000 mg | ORAL_TABLET | Freq: Every day | ORAL | Status: DC

## 2014-04-12 MED ORDER — ESZOPICLONE 2 MG PO TABS: 2.0000 mg | ORAL_TABLET | Freq: Every evening | ORAL | Status: DC | PRN

## 2014-04-12 NOTE — Progress Notes (Signed)
Chief Complaint  Patient presents with  . Hypertension    nonfasting med check- labs done. He would like a flu vaccine today(chart says egg allergy-just want to make sure it is ok to give).   Patient presents to f/u on his labs, diabetes and high cholesterol.  He reports he got tired of eating oatmeal for breakfast, so 2-3 times/week has sausage or bacon and eggs since his last visit.  Blood sugars went up to 151 so he stopped taking atorvastatin. He stopped this in the past (last visit) for the same reason. He was not having any myalgias or side effects from the statin, just concerned about the rise in blood sugars.  He was diagnosed with DM at [facility] (when A1c was 6.7, after a vacation).  They prescribed metformin, but he never started it.    Hypertension follow-up:  Blood pressures elsewhere are mid-120's/low 70's.  It was 106/66 when he donated blood recently. Hg was 15.  Denies dizziness, headaches, chest pain.  Denies side effects of medications.  He is requesting prescription of Lunesta.  He has tried it in the past (daughter's) and would like it for an upcoming trip.  Past Medical History  Diagnosis Date  . Pure hypercholesterolemia   . Syncope 2011    negative workup except LBBB  . LBBB (left bundle branch block)     had cardiology eval.  Seems to occur with exertion, not at rest (never had cath)  . Impaired fasting glucose 2013  . Hypertension 05/2013  . Type II or unspecified type diabetes mellitus without mention of complication, not stated as uncontrolled     A1c 6.7 at [place] in 2013 (after a vacation)   Past Surgical History  Procedure Laterality Date  . Knee arthroscopy  02/2011    L knee for meniscal tear. [name]  . Inguinal hernia repair      bilateral  . Finger tendon repair      R 2nd and 3rd finger  . Uvulectomy      laser treatment for sleep apnea (NOT UP3)  . Vasectomy     History   Social History  . Marital Status: Married    Spouse Name: N/A     Number of Children: 4  . Years of Education: N/A   Occupational History  . retired    Social History Main Topics  . Smoking status: Never Smoker   . Smokeless tobacco: Never Used  . Alcohol Use: Yes     Comment: 3-4 drinks per month.  . Drug Use: No  . Sexual Activity: Not on file   Other Topics Concern  . Not on file   Social History Narrative   Lives with wife, cat.  Daughter, Junie Panning, in [place], 2 children in [place], son in [place]    Outpatient Encounter Prescriptions as of 04/12/2014  Medication Sig Note  . ascorbic acid (VITAMIN C) 100 MG tablet Take 500 mg by mouth daily.   Marland Kitchen aspirin 81 MG tablet Take 81 mg by mouth daily.   Marland Kitchen CINNAMON PO Take 1 capsule by mouth 3 (three) times daily. 04/12/2014: Takes after meals BID and then qhs.  . Coenzyme Q10 (COQ10) 100 MG CAPS Take 1 capsule by mouth 2 (two) times daily.   . fish oil-omega-3 fatty acids 1000 MG capsule Take 3 g by mouth daily.    Marland Kitchen lisinopril (PRINIVIL,ZESTRIL) 5 MG tablet TAKE 1 TABLET BY MOUTH EVERY DAY   . Multiple Vitamins-Minerals (MENS 50+ MULTI VITAMIN/MIN  PO) Take 1 tablet by mouth daily.   . Red Yeast Rice 600 MG CAPS Take 2 capsules by mouth. 04/12/2014: Started on 9/24 (when he stopped taking lipitor)  . atorvastatin (LIPITOR) 10 MG tablet Take 1 tablet (10 mg total) by mouth daily. 04/12/2014: Stopped taking 2 weeks ago.   . [DISCONTINUED] Garcinia Cambogia-Chromium 500-200 MG-MCG TABS Take 1 tablet by mouth daily.  11/15/2013: Restarted once daily (didn't affect BP)   ROS:  No fever, chills, URI or allergy symptoms.  Denies cough, shortness of breath, nausea, vomiting, diarrhea, bleeding/bruising or rashes.  Denies depression. +insomnia (when slept at grandkids house while babysitting).  Trouble getting back to sleep after early morning wakening to void.  No longer having any calf pain.  PHYSICAL EXAM: BP 132/70  Pulse 84  Ht 6' 0.5" (1.842 m)  Wt 193 lb (87.544 kg)  BMI 25.80 kg/m2  Pleasant male in no  distress Neck: no lymphadenopathy, thyromegaly, carotid bruit Heart: regular rate and rhythm Lungs: clear bilaterally Abdomen: soft, nontender Extremities: no edema Neuro: alert and oriented. Cranial nerves intact. Normal strength, gait Psych: normal mood, affect, hygiene and grooming  Lab Results  Component Value Date   HGBA1C 6.3* 04/09/2014   (up from 6.1 in May).  Lab Results  Component Value Date   CHOL 174 04/09/2014   HDL 48 04/09/2014   LDLCALC 116* 04/09/2014   TRIG 48 04/09/2014   CHOLHDL 3.6 04/09/2014     Chemistry      Component Value Date/Time   NA 139 04/09/2014 0844   K 4.0 04/09/2014 0844   CL 107 04/09/2014 0844   CO2 27 04/09/2014 0844   BUN 17 04/09/2014 0844   CREATININE 0.91 04/09/2014 0844   CREATININE 1.00 11/24/2009 0555      Component Value Date/Time   CALCIUM 8.9 04/09/2014 0844   ALKPHOS 48 04/09/2014 0844   AST 24 04/09/2014 0844   ALT 18 04/09/2014 0844   BILITOT 0.5 04/09/2014 0844     Fasting glucose 103  ASSESSMENT/PLAN:  Pure hypercholesterolemia - LDL above goal--related to higher cholesterol diet and stopping atorvastatin.  tolerated statin without side effects--to restart. reassured re: sugars  Type 2 diabetes mellitus, controlled - Discussed natural course of DM, rec for statins with DM.  Start metformin--will call with dose of med he has at home from [place] hypertension, benign - controlled  Insomnia - reviewed risks/side effects of Lunesta--rx'd to use prn for his trip to [place].  - Plan: eszopiclone (LUNESTA) 2 MG TABS tablet  Need for prophylactic vaccination and inoculation against influenza - Plan: Flu vaccine HIGH DOSE PF (Fluzone Tri High dose)  Noncompliance with medications  Start taking Metformin (has rx from [place]). Restart atorvastatin.  F/u 3 months with fasting labs prior  Encouraged to not stop meds without consulting with me first  (as I did at his last visit, which he still ignored).

## 2014-04-12 NOTE — Telephone Encounter (Signed)
Spoke with patient, and given the options he would like me to send a 30 day to [facility] and see if it is on the free list. If it is not, he will take the ones he has one daily for the first 3 days and then go up to BID. He will call me back to let me know if rx is not free.

## 2014-04-12 NOTE — Telephone Encounter (Signed)
Patient called and left message on my voicemail to let you know that his metformin rx bottle says : metformin 500mg  BID.

## 2014-04-12 NOTE — Patient Instructions (Addendum)
Please call when you get home and read the information [facility]--dose (500mg  vs 1000mg ), if there is an ER or XL after the dose, and the directions--once or twice daily.  Please start taking Metformin every day, as directed. Please restart the lipitor and stop the red yeast rice.  Cut back on egg yolks (limit to 2-3/wk), and consider substituting [place] versions of sausage and bacon, to cut back on the cholesterol (red meat).  PLEASE CALL IF YOU HAVE CONCERNS REGARDING YOUR MEDICATIONS. DO NOT STOP YOUR MEDICATIONS WITHOUT CONSULTING ME.   Fat and Cholesterol Control Diet Fat and cholesterol levels in your blood and organs are influenced by your diet. High levels of fat and cholesterol may lead to diseases of the heart, small and large blood vessels, gallbladder, liver, and pancreas. CONTROLLING FAT AND CHOLESTEROL WITH DIET Although exercise and lifestyle factors are important, your diet is key. That is because certain foods are known to raise cholesterol and others to lower it. The goal is to balance foods for their effect on cholesterol and more importantly, to replace saturated and trans fat with other types of fat, such as monounsaturated fat, polyunsaturated fat, and omega-3 fatty acids. On average, a person should consume no more than 15 to 17 g of saturated fat daily. Saturated and trans fats are considered "bad" fats, and they will raise LDL cholesterol. Saturated fats are primarily found in animal products such as meats, butter, and cream. However, that does not mean you need to give up all your favorite foods. Today, there are good tasting, low-fat, low-cholesterol substitutes for most of the things you like to eat. Choose low-fat or nonfat alternatives. Choose round or loin cuts of red meat. These types of cuts are lowest in fat and cholesterol. Chicken (without the skin), fish, veal, and ground [place] breast are great choices. Eliminate fatty meats, such as hot  dogs and salami. Even shellfish have little or no saturated fat. Have a 3 oz (85 g) portion when you eat lean meat, poultry, or fish. Trans fats are also called "partially hydrogenated oils." They are oils that have been scientifically manipulated so that they are solid at room temperature resulting in a longer shelf life and improved taste and texture of foods in which they are added. Trans fats are found in stick margarine, some tub margarines, cookies, crackers, and baked goods.  When baking and cooking, oils are a great substitute for butter. The monounsaturated oils are especially beneficial since it is believed they lower LDL and raise HDL. The oils you should avoid entirely are saturated tropical oils, such as coconut and palm.  Remember to eat a lot from food groups that are naturally free of saturated and trans fat, including fish, fruit, vegetables, beans, grains (barley, rice, couscous, bulgur wheat), and pasta (without cream sauces).  IDENTIFYING FOODS THAT LOWER FAT AND CHOLESTEROL  Soluble fiber may lower your cholesterol. This type of fiber is found in fruits such as apples, vegetables such as broccoli, potatoes, and carrots, legumes such as beans, peas, and lentils, and grains such as barley. Foods fortified with plant sterols (phytosterol) may also lower cholesterol. You should eat at least 2 g per day of these foods for a cholesterol lowering effect.  Read package labels to identify low-saturated fats, trans fat free, and low-fat foods at the supermarket. Select cheeses that have only 2 to 3 g saturated fat per ounce. Use a heart-healthy tub margarine that is free of trans  fats or partially hydrogenated oil. When buying baked goods (cookies, crackers), avoid partially hydrogenated oils. Breads and muffins should be made from whole grains (whole-wheat or whole oat flour, instead of "flour" or "enriched flour"). Buy non-creamy canned soups with reduced salt and no added fats.  FOOD PREPARATION  TECHNIQUES  Never deep-fry. If you must fry, either stir-fry, which uses very little fat, or use non-stick cooking sprays. When possible, broil, bake, or roast meats, and steam vegetables. Instead of putting butter or margarine on vegetables, use lemon and herbs, applesauce, and cinnamon (for squash and sweet potatoes). Use nonfat yogurt, salsa, and low-fat dressings for salads.  LOW-SATURATED FAT / LOW-FAT FOOD SUBSTITUTES Meats / Saturated Fat (g)  Avoid: Steak, marbled (3 oz/85 g) / 11 g  Choose: Steak, lean (3 oz/85 g) / 4 g  Avoid: Hamburger (3 oz/85 g) / 7 g  Choose: Hamburger, lean (3 oz/85 g) / 5 g  Avoid: Ham (3 oz/85 g) / 6 g  Choose: Ham, lean cut (3 oz/85 g) / 2.4 g  Avoid: Chicken, with skin, dark meat (3 oz/85 g) / 4 g  Choose: Chicken, skin removed, dark meat (3 oz/85 g) / 2 g  Avoid: Chicken, with skin, light meat (3 oz/85 g) / 2.5 g  Choose: Chicken, skin removed, light meat (3 oz/85 g) / 1 g Dairy / Saturated Fat (g)  Avoid: Whole milk (1 cup) / 5 g  Choose: Low-fat milk, 2% (1 cup) / 3 g  Choose: Low-fat milk, 1% (1 cup) / 1.5 g  Choose: Skim milk (1 cup) / 0.3 g  Avoid: Hard cheese (1 oz/28 g) / 6 g  Choose: Skim milk cheese (1 oz/28 g) / 2 to 3 g  Avoid: Cottage cheese, 4% fat (1 cup) / 6.5 g  Choose: Low-fat cottage cheese, 1% fat (1 cup) / 1.5 g  Avoid: Ice cream (1 cup) / 9 g  Choose: Sherbet (1 cup) / 2.5 g  Choose: Nonfat frozen yogurt (1 cup) / 0.3 g  Choose: Frozen fruit bar / trace  Avoid: Whipped cream (1 tbs) / 3.5 g  Choose: Nondairy whipped topping (1 tbs) / 1 g Condiments / Saturated Fat (g)  Avoid: Mayonnaise (1 tbs) / 2 g  Choose: Low-fat mayonnaise (1 tbs) / 1 g  Avoid: Butter (1 tbs) / 7 g  Choose: Extra light margarine (1 tbs) / 1 g  Avoid: Coconut oil (1 tbs) / 11.8 g  Choose: Olive oil (1 tbs) / 1.8 g  Choose: Corn oil (1 tbs) / 1.7 g  Choose: Safflower oil (1 tbs) / 1.2 g  Choose: Sunflower oil (1 tbs) /  1.4 g  Choose: Soybean oil (1 tbs) / 2.4 g  Choose: Canola oil (1 tbs) / 1 g Document Released: 06/29/2005 Document Revised: 10/24/2012 Document Reviewed: 09/27/2013 Exit[employer] Patient Information 2015 [place], [place]. This information is not intended to replace advice given to you by your health care provider. Make sure you discuss any questions you have with your health care provider.

## 2014-04-12 NOTE — Telephone Encounter (Signed)
Advise pt--my recommendation would be to start with 500mg  of the extended release, rather than BID of the regular metformin.  This med I believe would be free at [facility] (they have free 30 day supply of generic diabetes medications).  I know that he can get his meds free from [place].  If he decides to take the ones prescribed by the [place], I would take it just once daily for the first 3 days, to see how he tolerates it (ensure it doesn't cause significant GI upset), and then increase to twice daily.

## 2014-04-21 ENCOUNTER — Telehealth: Payer: Self-pay | Admitting: Family Medicine

## 2014-04-23 NOTE — Telephone Encounter (Signed)
P.A. Approved til 07/13/15, faxed pharmacy, left message for pt

## 2014-05-19 ENCOUNTER — Other Ambulatory Visit: Payer: Self-pay | Admitting: Family Medicine

## 2014-05-21 ENCOUNTER — Other Ambulatory Visit: Payer: Self-pay | Admitting: *Deleted

## 2014-05-21 ENCOUNTER — Encounter: Payer: Medicare HMO | Admitting: Family Medicine

## 2014-05-21 ENCOUNTER — Telehealth: Payer: Self-pay | Admitting: Family Medicine

## 2014-05-21 MED ORDER — METFORMIN HCL ER 500 MG PO TB24: 500.0000 mg | ORAL_TABLET | Freq: Every day | ORAL | Status: DC

## 2014-05-21 NOTE — Telephone Encounter (Signed)
Pt caled and left message on voice mail that he would like Metformin ER refilled to [facility] and to let him know at [phone].  I see a refill from 04-12-14 so not sure of the question.  Please advise pt.

## 2014-05-21 NOTE — Telephone Encounter (Signed)
He just needed a refill. Only sent for #30 04/12/14.

## 2014-06-13 ENCOUNTER — Encounter: Payer: Self-pay | Admitting: Family Medicine

## 2014-06-13 ENCOUNTER — Ambulatory Visit (INDEPENDENT_AMBULATORY_CARE_PROVIDER_SITE_OTHER): Payer: Medicare HMO | Admitting: Family Medicine

## 2014-06-13 VITALS — BP 140/50 | HR 56 | Ht 72.5 in | Wt 199.0 lb

## 2014-06-13 DIAGNOSIS — I10 Essential (primary) hypertension: Secondary | ICD-10-CM

## 2014-06-13 DIAGNOSIS — F411 Generalized anxiety disorder: Secondary | ICD-10-CM

## 2014-06-13 DIAGNOSIS — Z125 Encounter for screening for malignant neoplasm of prostate: Secondary | ICD-10-CM

## 2014-06-13 NOTE — Patient Instructions (Signed)
  Based on the fact that BP here in October was good, as well as your home values (checking 1-2 times/week prior to recent [place] visit) I think the 5mg  dose once daily is fine.  I believe there is a component of anxiety, and since getting a high BP at the [place], and checking it more frequently, that the anxiety is making the numbers run a little higher. Repeat here today was okay (diastolic was actually low). Therefore, go back to just 5 mg daily (do not continue 10mg  daily).  Continue to monitor--you don't need to check as often.  Keep a list that also includes a "comment" section as we discussed--whether you felt bad (headache, dizzy, sick) or change in activity (ie exercise, change in diet such as [place] food).

## 2014-06-13 NOTE — Progress Notes (Signed)
Chief Complaint  Patient presents with  . Hypertension    has been having elevated blood pressure readings. Has been feeling fatigued. Woke up this morning feeling foggy. Doubled up on his lisinopril, been taking 5mg  for the last 72 hrs.   He went to the [place] on 11/23, and BP was 188/88; decreased to 156/78 on recheck.  He doesn't recall getting any high BP's prior to that visit when checking at home.  Typically checks BP 2x/week.  He has been checking regularly since that time, 1-2 times daily.  Numbers are ranging from 132-179/62-90 (mostly 150-160/80's).  He increased lisinopril dose from 5 to 10mg  three days ago. BP's remain about the same.   He went back to the [place] on 11/30 (for skin treatments) and dropped off list of BP's as requested by them.  He has yet to hear back from them re: recommendations.  Denies any changes in his diet (other than brined [place] for Thanksgiving, but BP was higher prior to eating this).  He continues to walk 2x/week. He had some headaches last week (11/27 and 11/28)--behind his right eye, when going to sleep at night, and had similar headache in the morning.  No other headaches. Denies chest pain, palpitations, shortness of breath or edema.  Today he felt somewhat groggy, as if he had taken Nyquil.  He still feels a little foggy.  Denies allergy symptoms.  He states he didn't get blood drawn at the [place] this year.  So, hasn't had PSA done. He did have prostate check with his physical at the [place].  PMH, PSH, SH reviewed. He now has 10 grandchildren and is somewhat anxious about babysitting the 2 youngest alone this weekend.  Outpatient Encounter Prescriptions as of 06/13/2014  Medication Sig Note  . ascorbic acid (VITAMIN C) 100 MG tablet Take 500 mg by mouth daily.   Marland Kitchen aspirin 81 MG tablet Take 81 mg by mouth daily.   Marland Kitchen atorvastatin (LIPITOR) 10 MG tablet Take 1 tablet (10 mg total) by mouth daily. 06/13/2014: .  Marland Kitchen CINNAMON PO Take 1 capsule by mouth 3 (three) times  daily. 04/12/2014: Takes after meals BID and then qhs.  . Coenzyme Q10 (COQ10) 200 MG CAPS Take 2 capsules by mouth daily.   . fish oil-omega-3 fatty acids 1000 MG capsule Take 3 g by mouth daily.    Marland Kitchen lisinopril (PRINIVIL,ZESTRIL) 5 MG tablet TAKE 1 TABLET BY MOUTH EVERY DAY 06/13/2014: Taking 10mg  for the last 3 days  . metFORMIN (GLUCOPHAGE-XR) 500 MG 24 hr tablet Take 1 tablet (500 mg total) by mouth daily with breakfast.   . Multiple Vitamins-Minerals (MENS 50+ MULTI VITAMIN/MIN PO) Take 1 tablet by mouth daily.   . eszopiclone (LUNESTA) 2 MG TABS tablet Take 1 tablet (2 mg total) by mouth at bedtime as needed for sleep. Take immediately before bedtime (Patient not taking: Reported on 06/13/2014) 06/13/2014: Just uses on trips/flying  . [DISCONTINUED] Coenzyme Q10 (COQ10) 100 MG CAPS Take 1 capsule by mouth 2 (two) times daily.   . [DISCONTINUED] Red Yeast Rice 600 MG CAPS Take 2 capsules by mouth. 04/12/2014: Started on 9/24 (when he stopped taking lipitor)   Allergies  Allergen Reactions  . Horse-Derived Products     REACTION: Anaphlactic Reaction.  Can't take tetanus shot  . Pravastatin Other (See Comments)    Myalgia   . Simvastatin Other (See Comments)    Myalgias.  . Adhesive [Tape] Itching and Rash    ROS:  No fevers, chills;  Has some general fatigue in the last week, especially by 9pm.  No URI or allergy symptoms. No nausea, vomiting, urinary or bowel problems.  See HPI  PHYSICAL EXAM: BP 150/60 mmHg  Pulse 56  Ht 6' 0.5" (1.842 m)  Wt 199 lb (90.266 kg)  BMI 26.60 kg/m2 140/50 on repeat on right arm 132/54 on left arm Pleasant, talkative male in no distress Neck: no lymphadenopathy or mass Heart: bradycardic.  Regular rhythm, no murmur Lungs: clear bilaterally Extremities: no edema Psych: mildly anxious at times, full range of affect, telling stories Neuro: alert and oriented.  Cranial nerves intact, normal strength, gait  ASSESSMENT/PLAN:  Essential  hypertension  Screening for prostate cancer - Plan: PSA  Anxiety state    Based on the fact that BP here in October was good, as well as your home values (checking 1-2 times/week prior to recent [place] visit) I think the 5mg  dose once daily is fine.  I believe there is a component of anxiety, and since getting a high BP at the [place], and checking it more frequently, that the anxiety is making the numbers run a little higher. Repeat here today was okay (diastolic was actually low). Therefore, go back to just 5 mg daily (do not continue 10mg  daily).  Continue to monitor--you don't need to check as often.  Keep a list that also includes a "comment" section as we discussed--whether you felt bad (headache, dizzy, sick) or change in activity (ie exercise, change in diet such as [place] food).  Encouraged daily exercise.  25-30 min visit, more than 1/2 spent counseling, reassuring. He might be somewhat symptomatic from low diastolic, so encouraged him to go back to 5mg  dose.   F/u as scheduled in January Added PSA to future orders already in system

## 2014-06-25 ENCOUNTER — Other Ambulatory Visit: Payer: Self-pay | Admitting: *Deleted

## 2014-06-25 ENCOUNTER — Telehealth: Payer: Self-pay | Admitting: *Deleted

## 2014-06-25 DIAGNOSIS — R0602 Shortness of breath: Secondary | ICD-10-CM

## 2014-06-25 NOTE — Telephone Encounter (Signed)
Order placed, patient will go tomorrow.

## 2014-06-25 NOTE — Telephone Encounter (Signed)
Start with CXR--please put in order, and he can go today. If normal, okay to refer to cardiology for evaluation

## 2014-06-25 NOTE — Telephone Encounter (Signed)
Anne(wife) called and was very concerned about patient. States that as well as bp issues that he has been having he is now having SOB from something as simple as walking up the driveway. He is also very fatigued. She would like to know if you would like to see him or if not, can you refer to cardiology? Please advise, thanks.

## 2014-06-26 ENCOUNTER — Ambulatory Visit
Admission: RE | Admit: 2014-06-26 | Discharge: 2014-06-26 | Disposition: A | Discharge status: Home / Self Care | Payer: Medicare HMO | Source: Ambulatory Visit | Attending: Family Medicine | Admitting: Family Medicine

## 2014-06-26 ENCOUNTER — Other Ambulatory Visit: Payer: Self-pay

## 2014-06-26 DIAGNOSIS — R0609 Other forms of dyspnea: Principal | ICD-10-CM

## 2014-06-26 DIAGNOSIS — R0602 Shortness of breath: Secondary | ICD-10-CM

## 2014-06-26 DIAGNOSIS — R0600 Dyspnea, unspecified: Secondary | ICD-10-CM

## 2014-06-28 ENCOUNTER — Ambulatory Visit (INDEPENDENT_AMBULATORY_CARE_PROVIDER_SITE_OTHER): Payer: Medicare HMO | Admitting: Family Medicine

## 2014-06-28 ENCOUNTER — Encounter: Payer: Self-pay | Admitting: Family Medicine

## 2014-06-28 VITALS — BP 150/70 | HR 36 | Ht 72.5 in | Wt 201.0 lb

## 2014-06-28 DIAGNOSIS — R0602 Shortness of breath: Secondary | ICD-10-CM

## 2014-06-28 DIAGNOSIS — R001 Bradycardia, unspecified: Secondary | ICD-10-CM

## 2014-06-28 NOTE — Progress Notes (Signed)
Chief Complaint  Patient presents with  . Advice Only    still having some bp issues as well as SOB upon exertion x few weeks.    Since his last visit here (2 weeks ago), he noticed shortness of breath with exertion. Noticed at the top of the stairs. He feels like he isn't able to intake a full breath with exertion.  He feels completely fine while at rest.  Denies chest pain or pressure.  He has felt excessively fatigued.  Denies dizziness, edema. No change in medications (other than resuming 5mg  of lisinopril rather than 10mg  since last visit).  Denies any chest pain.  PMH, PSH, SH reviewed.  Outpatient Encounter Prescriptions as of 06/28/2014  Medication Sig Note  . ascorbic acid (VITAMIN C) 100 MG tablet Take 500 mg by mouth daily.   Marland Kitchen aspirin 81 MG tablet Take 81 mg by mouth daily.   Marland Kitchen atorvastatin (LIPITOR) 10 MG tablet Take 1 tablet (10 mg total) by mouth daily. 06/13/2014: .  Marland Kitchen CINNAMON PO Take 1 capsule by mouth 3 (three) times daily. 04/12/2014: Takes after meals BID and then qhs.  . Coenzyme Q10 (COQ10) 200 MG CAPS Take 2 capsules by mouth daily.   . fish oil-omega-3 fatty acids 1000 MG capsule Take 3 g by mouth daily.    . folic acid (FOLVITE) 161 MCG tablet Take 400 mcg by mouth daily.   Marland Kitchen lisinopril (PRINIVIL,ZESTRIL) 5 MG tablet TAKE 1 TABLET BY MOUTH EVERY DAY 06/28/2014: Taking 5mg  once daily  . metFORMIN (GLUCOPHAGE-XR) 500 MG 24 hr tablet Take 1 tablet (500 mg total) by mouth daily with breakfast.   . Multiple Vitamins-Minerals (MENS 50+ MULTI VITAMIN/MIN PO) Take 1 tablet by mouth daily.   . eszopiclone (LUNESTA) 2 MG TABS tablet Take 1 tablet (2 mg total) by mouth at bedtime as needed for sleep. Take immediately before bedtime (Patient not taking: Reported on 06/13/2014) 06/13/2014: Just uses on trips/flying   Allergies  Allergen Reactions  . Horse-Derived Products     REACTION: Anaphlactic Reaction.  Can't take tetanus shot  . Pravastatin Other (See Comments)     Myalgia   . Simvastatin Other (See Comments)    Myalgias.  . Adhesive [Tape] Itching and Rash   ROS:  No fevers, chills, URI symptoms, chest pain, headaches, dizziness.  No edema.  +DOE and fatigue.  PHYSICAL EXAM: BP 150/70 mmHg  Pulse 36  Ht 6' 0.5" (1.842 m)  Wt 201 lb (91.173 kg)  BMI 26.87 kg/m2  SpO2 99% Pleasant male in no distress HEENT: PERRL, conjunctiva clear Neck no lymphadenopathy, thyromegaly or mass Heart: bradycardic. No murmur, rub, gallop Lungs: clear bilaterally Abdomen: soft, nontender, no mass Extremities: no edema Neuro: alert and oriented.  Cranial nerves intact. Normal gait, strength Psych: normal mood, affect   EKG: sinus rhythm with 2nd degree AV block, 2:1 AV conduction.  Ventricular rate of 33.  Mild LAE. No acute ischemic changes noted.  CXR 06/2014 was normal.  ASSESSMENT/PLAN:  SOB (shortness of breath) - Plan: EKG 12-Lead, Comprehensive metabolic panel, CBC with Differential, TSH  Bradycardia - 2nd degree AV block with 2:1 AV conduction noted.  +symptomatic with DOE and fatigue. Get in with cardiology ASAP - Plan: Comprehensive metabolic panel, CBC with Differential, TSH   Discussed with Dr. Irish Lack, as their office was unable to get him in for an office visit prior to the scheduled date of 1/8.  EKG was faxed to Dr. Irish Lack, and upon review, stated they would  try and get him in with EP docs early next week.  To go to ER if any worsening shortness of breath, edema, CHF symptoms, chest pain prior to then.  Likely will need pacemaker.  CBC, c-met, TSH

## 2014-06-29 ENCOUNTER — Encounter: Payer: Self-pay | Admitting: *Deleted

## 2014-06-29 ENCOUNTER — Encounter (HOSPITAL_COMMUNITY): Payer: Self-pay | Admitting: *Deleted

## 2014-06-29 ENCOUNTER — Inpatient Hospital Stay (HOSPITAL_COMMUNITY)
Admission: EM | Admit: 2014-06-29 | Discharge: 2014-07-02 | DRG: 244 | Disposition: A | Discharge status: Home / Self Care | Payer: Medicare HMO | Attending: Cardiology | Admitting: Cardiology

## 2014-06-29 DIAGNOSIS — R944 Abnormal results of kidney function studies: Secondary | ICD-10-CM | POA: Diagnosis present

## 2014-06-29 DIAGNOSIS — G4733 Obstructive sleep apnea (adult) (pediatric): Secondary | ICD-10-CM | POA: Diagnosis present

## 2014-06-29 DIAGNOSIS — R001 Bradycardia, unspecified: Secondary | ICD-10-CM | POA: Diagnosis present

## 2014-06-29 DIAGNOSIS — Z7982 Long term (current) use of aspirin: Secondary | ICD-10-CM

## 2014-06-29 DIAGNOSIS — Z887 Allergy status to serum and vaccine status: Secondary | ICD-10-CM

## 2014-06-29 DIAGNOSIS — E785 Hyperlipidemia, unspecified: Secondary | ICD-10-CM | POA: Diagnosis present

## 2014-06-29 DIAGNOSIS — R55 Syncope and collapse: Secondary | ICD-10-CM | POA: Diagnosis present

## 2014-06-29 DIAGNOSIS — I447 Left bundle-branch block, unspecified: Secondary | ICD-10-CM | POA: Diagnosis present

## 2014-06-29 DIAGNOSIS — I071 Rheumatic tricuspid insufficiency: Secondary | ICD-10-CM | POA: Diagnosis present

## 2014-06-29 DIAGNOSIS — I340 Nonrheumatic mitral (valve) insufficiency: Secondary | ICD-10-CM | POA: Diagnosis present

## 2014-06-29 DIAGNOSIS — Z888 Allergy status to other drugs, medicaments and biological substances status: Secondary | ICD-10-CM

## 2014-06-29 DIAGNOSIS — R0600 Dyspnea, unspecified: Secondary | ICD-10-CM

## 2014-06-29 DIAGNOSIS — R0609 Other forms of dyspnea: Secondary | ICD-10-CM

## 2014-06-29 DIAGNOSIS — Z91048 Other nonmedicinal substance allergy status: Secondary | ICD-10-CM

## 2014-06-29 DIAGNOSIS — R5383 Other fatigue: Secondary | ICD-10-CM

## 2014-06-29 DIAGNOSIS — I441 Atrioventricular block, second degree: Secondary | ICD-10-CM | POA: Diagnosis not present

## 2014-06-29 DIAGNOSIS — E119 Type 2 diabetes mellitus without complications: Secondary | ICD-10-CM | POA: Diagnosis present

## 2014-06-29 DIAGNOSIS — Z950 Presence of cardiac pacemaker: Secondary | ICD-10-CM

## 2014-06-29 DIAGNOSIS — I10 Essential (primary) hypertension: Secondary | ICD-10-CM | POA: Diagnosis present

## 2014-06-29 DIAGNOSIS — E780 Pure hypercholesterolemia: Secondary | ICD-10-CM | POA: Diagnosis present

## 2014-06-29 DIAGNOSIS — I081 Rheumatic disorders of both mitral and tricuspid valves: Secondary | ICD-10-CM | POA: Diagnosis present

## 2014-06-29 HISTORY — DX: Nonrheumatic mitral (valve) insufficiency: I34.0

## 2014-06-29 HISTORY — DX: Hyperlipidemia, unspecified: E78.5

## 2014-06-29 HISTORY — DX: Cardiomegaly: I51.7

## 2014-06-29 HISTORY — DX: Type 2 diabetes mellitus without complications: E11.9

## 2014-06-29 HISTORY — DX: Obstructive sleep apnea (adult) (pediatric): G47.33

## 2014-06-29 HISTORY — DX: Rheumatic tricuspid insufficiency: I07.1

## 2014-06-29 HISTORY — DX: Presence of cardiac pacemaker: Z95.0

## 2014-06-29 HISTORY — DX: Atrioventricular block, second degree: I44.1

## 2014-06-29 LAB — CBC WITH DIFFERENTIAL/PLATELET
Basophils Absolute: 0 10*3/uL (ref 0.0–0.1)
Basophils Relative: 0 % (ref 0–1)
Eosinophils Absolute: 0.1 10*3/uL (ref 0.0–0.7)
Eosinophils Relative: 2 % (ref 0–5)
HCT: 40.6 % (ref 39.0–52.0)
Hemoglobin: 13.1 g/dL (ref 13.0–17.0)
Lymphocytes Relative: 22 % (ref 12–46)
Lymphs Abs: 1 10*3/uL (ref 0.7–4.0)
MCH: 28.9 pg (ref 26.0–34.0)
MCHC: 32.3 g/dL (ref 30.0–36.0)
MCV: 89.4 fL (ref 78.0–100.0)
MPV: 9.5 fL (ref 9.4–12.4)
Monocytes Absolute: 0.6 10*3/uL (ref 0.1–1.0)
Monocytes Relative: 12 % (ref 3–12)
Neutro Abs: 2.9 10*3/uL (ref 1.7–7.7)
Neutrophils Relative %: 64 % (ref 43–77)
Platelets: 162 10*3/uL (ref 150–400)
RBC: 4.54 MIL/uL (ref 4.22–5.81)
RDW: 14.7 % (ref 11.5–15.5)
WBC: 4.6 10*3/uL (ref 4.0–10.5)

## 2014-06-29 LAB — COMPREHENSIVE METABOLIC PANEL
ALT: 25 U/L (ref 0–53)
AST: 21 U/L (ref 0–37)
Albumin: 4.2 g/dL (ref 3.5–5.2)
Alkaline Phosphatase: 53 U/L (ref 39–117)
BUN: 17 mg/dL (ref 6–23)
CO2: 28 mEq/L (ref 19–32)
Calcium: 9.3 mg/dL (ref 8.4–10.5)
Chloride: 105 mEq/L (ref 96–112)
Creat: 0.96 mg/dL (ref 0.50–1.35)
Glucose, Bld: 92 mg/dL (ref 70–99)
Potassium: 4.1 mEq/L (ref 3.5–5.3)
Sodium: 139 mEq/L (ref 135–145)
Total Bilirubin: 0.5 mg/dL (ref 0.2–1.2)
Total Protein: 6.6 g/dL (ref 6.0–8.3)

## 2014-06-29 LAB — I-STAT TROPONIN, ED: Troponin i, poc: 0.01 ng/mL (ref 0.00–0.08)

## 2014-06-29 LAB — I-STAT CHEM 8, ED
BUN: 28 mg/dL — ABNORMAL HIGH (ref 6–23)
Calcium, Ion: 1.08 mmol/L — ABNORMAL LOW (ref 1.13–1.30)
Chloride: 104 mEq/L (ref 96–112)
Creatinine, Ser: 1.5 mg/dL — ABNORMAL HIGH (ref 0.50–1.35)
Glucose, Bld: 117 mg/dL — ABNORMAL HIGH (ref 70–99)
HCT: 42 % (ref 39.0–52.0)
Hemoglobin: 14.3 g/dL (ref 13.0–17.0)
Potassium: 4.2 mEq/L (ref 3.7–5.3)
Sodium: 141 mEq/L (ref 137–147)
TCO2: 23 mmol/L (ref 0–100)

## 2014-06-29 LAB — TSH: TSH: 1.405 u[IU]/mL (ref 0.350–4.500)

## 2014-06-29 NOTE — ED Notes (Signed)
Pt. Was put on lisinopril about a month ago and started noticing a decrease in HR. Pt. Wife is an ER nurse and has said pt's HR has been in the 30s and has dropped as low as 28. Pt. Is asymptomatic at rest.

## 2014-06-29 NOTE — ED Provider Notes (Addendum)
CSN: [id number]     Arrival date & time 06/29/14  2302 History  This chart was scribed for Janice Norrie, MD by Molli Posey, ED Scribe. This patient was seen in room A02C/A02C and the patient's care was started 11:17 PM.     Chief Complaint  Patient presents with  . Bradycardia   HPI HPI Comments: Alexander Curtis is a 68 y.o. male who presents to the Emergency Department complaining of dyspnea on exertion and fatigue for the past week and  bradycardia that started this week.   His wife reports that pt was put on lisinopril about 1 month ago when his BP was elevated up to 190/80 despite being 409 systolic in October when he was seen at the [facility] and started noticing a decrease in HR this week. Pt is using a  home blood pressure monitor when it began reading error for his heart rate a few days ago. He was seen by his PCP yesterday and was noted to have a heart rate on 33 with second degree heart block. He was referred to cardiology (Dr Irish Lack reviewed his EKG and arranged for him to be seen in the office in 3 days, to go to the ED if he became more symptomatic).  Wife is a retired Health visitor and noticed that his HR was not only slow but also irregular tonight. She says pt's HR has been in the 30s and has dropped as low as 28. She states that his blood pressure has been high as well and was 190/80 with a pulse of 32 this morning. He reports he had a HA this morning that he describes as a "pressure." His wife reports that he has not been symptomatic unless he exerts himself then he gets SOB. Pt denies CP, SOB at rest, light-headedness, dizziness, nausea and vomiting. Pt has a doctors appointment in 3 days. He reports no prior cardiac history other than a transient LBBB about 3 years ago. Pt reports he has no family cardiac history including pacemakers.   PCP Dr Rita Ohara Cardiology Dr Verl Blalock 3 years ago when he had syncope and new LBBB that resolved   Past Medical History  Diagnosis Date  . Pure  hypercholesterolemia   . Syncope 2011    negative workup except LBBB  . LBBB (left bundle branch block)     had cardiology eval.  Seems to occur with exertion, not at rest (never had cath)  . Impaired fasting glucose 2013  . Hypertension 05/2013  . Type II or unspecified type diabetes mellitus without mention of complication, not stated as uncontrolled     A1c 6.7 at [place] in 2013 (after a vacation)   Past Surgical History  Procedure Laterality Date  . Knee arthroscopy  02/2011    L knee for meniscal tear. [name]  . Inguinal hernia repair      bilateral  . Finger tendon repair      R 2nd and 3rd finger  . Uvulectomy      laser treatment for sleep apnea (NOT UP3)  . Vasectomy     Family History  Problem Relation Age of Onset  . Dementia Mother   . Cancer Mother     ?type, was told it contributed to death, per pt  . Melanoma Mother 97  . Stroke Father 20    cerebral aneurysm  . Diabetes Sister     borderline, obese  . Dementia Paternal Aunt   . Diabetes Maternal Grandmother   .  Heart disease Maternal Grandfather    History  Substance Use Topics  . Smoking status: Never Smoker   . Smokeless tobacco: Never Used  . Alcohol Use: Yes     Comment: 3-4 drinks per month.  lives at home Lives with spouse who is a retired ED nurse  Review of Systems  Constitutional: Negative for diaphoresis.  Respiratory: Negative for shortness of breath.   Cardiovascular: Negative for chest pain.       Bradycardia  Gastrointestinal: Negative for nausea and vomiting.  Neurological: Positive for headaches. Negative for dizziness and light-headedness.  All other systems reviewed and are negative.   Allergies  Horse-derived products; Pravastatin; and Adhesive  Home Medications   Prior to Admission medications   Medication Sig Start Date End Date Taking? Authorizing Provider  ascorbic acid (VITAMIN C) 100 MG tablet Take 500 mg by mouth daily.    Historical Provider, MD  aspirin 81 MG  tablet Take 81 mg by mouth daily.    Historical Provider, MD  atorvastatin (LIPITOR) 10 MG tablet Take 1 tablet (10 mg total) by mouth daily. 01/18/14   Rita Ohara, MD  CINNAMON PO Take 1 capsule by mouth 3 (three) times daily.    Historical Provider, MD  Coenzyme Q10 (COQ10) 200 MG CAPS Take 2 capsules by mouth daily.    Historical Provider, MD  eszopiclone (LUNESTA) 2 MG TABS tablet Take 1 tablet (2 mg total) by mouth at bedtime as needed for sleep. Take immediately before bedtime Patient not taking: Reported on 06/13/2014 04/12/14   Rita Ohara, MD  fish oil-omega-3 fatty acids 1000 MG capsule Take 3 g by mouth daily.     Historical Provider, MD  folic acid (FOLVITE) 161 MCG tablet Take 400 mcg by mouth daily.    Historical Provider, MD  lisinopril (PRINIVIL,ZESTRIL) 5 MG tablet TAKE 1 TABLET BY MOUTH EVERY DAY 05/21/14   Rita Ohara, MD  metFORMIN (GLUCOPHAGE-XR) 500 MG 24 hr tablet Take 1 tablet (500 mg total) by mouth daily with breakfast. 05/21/14   Rita Ohara, MD  Multiple Vitamins-Minerals (MENS 50+ MULTI VITAMIN/MIN PO) Take 1 tablet by mouth daily.    Historical Provider, MD   Pulse 35  Temp(Src) 98 F (36.7 C)  Resp 19  Ht $R'6\' 1"'kj$  (1.854 m)  Wt 197 lb (89.359 kg)  BMI 26.00 kg/m2  SpO2 98%  Vital signs normal except bradycardia  December 2 his heart rate was noted to be 56 with a blood pressure of 150/60.   Physical Exam  Constitutional: He is oriented to person, place, and time. He appears well-developed and well-nourished.  Non-toxic appearance. He does not appear ill. No distress.  HENT:  Head: Normocephalic and atraumatic.  Right Ear: External ear normal.  Left Ear: External ear normal.  Nose: Nose normal. No mucosal edema or rhinorrhea.  Mouth/Throat: Oropharynx is clear and moist and mucous membranes are normal. No dental abscesses or uvula swelling.  Eyes: Conjunctivae and EOM are normal. Pupils are equal, round, and reactive to light.  Neck: Normal range of motion and full  passive range of motion without pain. Neck supple.  Cardiovascular: Normal rate and normal heart sounds.  Exam reveals no gallop and no friction rub.   No murmur heard. Bradycardia  Pulmonary/Chest: Effort normal and breath sounds normal. No respiratory distress. He has no wheezes. He has no rhonchi. He has no rales. He exhibits no tenderness and no crepitus.     Abdominal: Soft. Normal appearance and bowel sounds are  normal. He exhibits no distension. There is no tenderness. There is no rebound and no guarding.  Musculoskeletal: Normal range of motion. He exhibits no edema or tenderness.  Moves all extremities well.   Neurological: He is alert and oriented to person, place, and time. He has normal strength. No cranial nerve deficit.  Skin: Skin is warm, dry and intact. No rash noted. No erythema. No pallor.  Psychiatric: He has a normal mood and affect. His speech is normal and behavior is normal. His mood appears not anxious.  Nursing note and vitals reviewed.   ED Course  Procedures   DIAGNOSTIC STUDIES: Oxygen Saturation is 98% on RA, normal by my interpretation.    COORDINATION OF CARE: 11:27 PM Discussed treatment plan with pt at bedside and pt agreed to plan.  Dr Ashok Cordia spoke to the Cardiology Fellow, Dr Tommi Rumps, about seeing patient.  01:50 Dr Tommi Rumps has seen patient and is going to admit.    Labs Review  Results for orders placed or performed during the hospital encounter of 06/29/14  Magnesium  Result Value Ref Range   Magnesium 1.9 1.5 - 2.5 mg/dL  I-stat troponin, ED  Result Value Ref Range   Troponin i, poc 0.01 0.00 - 0.08 ng/mL   Comment 3          I-stat Chem 8, ED  Result Value Ref Range   Sodium 141 137 - 147 mEq/L   Potassium 4.2 3.7 - 5.3 mEq/L   Chloride 104 96 - 112 mEq/L   BUN 28 (H) 6 - 23 mg/dL   Creatinine, Ser 1.50 (H) 0.50 - 1.35 mg/dL   Glucose, Bld 117 (H) 70 - 99 mg/dL   Calcium, Ion 1.08 (L) 1.13 - 1.30 mmol/L   TCO2 23 0 - 100 mmol/L    Hemoglobin 14.3 13.0 - 17.0 g/dL   HCT 42.0 39.0 - 52.0 %   Laboratory interpretation all normal except new renal insufficiency    Results for orders placed or performed in visit on 06/28/14  Comprehensive metabolic panel  Result Value Ref Range   Sodium 139 135 - 145 mEq/L   Potassium 4.1 3.5 - 5.3 mEq/L   Chloride 105 96 - 112 mEq/L   CO2 28 19 - 32 mEq/L   Glucose, Bld 92 70 - 99 mg/dL   BUN 17 6 - 23 mg/dL   Creat 0.96 0.50 - 1.35 mg/dL   Total Bilirubin 0.5 0.2 - 1.2 mg/dL   Alkaline Phosphatase 53 39 - 117 U/L   AST 21 0 - 37 U/L   ALT 25 0 - 53 U/L   Total Protein 6.6 6.0 - 8.3 g/dL   Albumin 4.2 3.5 - 5.2 g/dL   Calcium 9.3 8.4 - 10.5 mg/dL  CBC with Differential  Result Value Ref Range   WBC 4.6 4.0 - 10.5 K/uL   RBC 4.54 4.22 - 5.81 MIL/uL   Hemoglobin 13.1 13.0 - 17.0 g/dL   HCT 40.6 39.0 - 52.0 %   MCV 89.4 78.0 - 100.0 fL   MCH 28.9 26.0 - 34.0 pg   MCHC 32.3 30.0 - 36.0 g/dL   RDW 14.7 11.5 - 15.5 %   Platelets 162 150 - 400 K/uL   MPV 9.5 9.4 - 12.4 fL   Neutrophils Relative % 64 43 - 77 %   Neutro Abs 2.9 1.7 - 7.7 K/uL   Lymphocytes Relative 22 12 - 46 %   Lymphs Abs 1.0 0.7 - 4.0 K/uL  Monocytes Relative 12 3 - 12 %   Monocytes Absolute 0.6 0.1 - 1.0 K/uL   Eosinophils Relative 2 0 - 5 %   Eosinophils Absolute 0.1 0.0 - 0.7 K/uL   Basophils Relative 0 0 - 1 %   Basophils Absolute 0.0 0.0 - 0.1 K/uL   Smear Review Criteria for review not met   TSH  Result Value Ref Range   TSH 1.405 0.350 - 4.500 uIU/mL       Imaging Review No results found.   CLINICAL DATA: Elevated blood pressure. Shortness of breath.  EXAM: CHEST 2 VIEW  COMPARISON: 11/23/2009.  FINDINGS: Mediastinum and hilar structures are normal the lungs are clear. Heart size normal. No pleural effusion or pneumothorax. Degenerative changes thoracic spine.  IMPRESSION: No acute cardiopulmonary disease.   Electronically Signed  By: Marcello Moores Register  On:  06/26/2014 11:53   EKG Interpretation   Date/Time:  Friday June 29 2014 23:07:38 EST Ventricular Rate:  36 PR Interval:  156 QRS Duration: 103 QT Interval:  510 QTC Calculation: 395 R Axis:   44 Text Interpretation:  Mobitz II 2-degree AV block Nonspecific T wave  abnormality Confirmed by Ashok Cordia  MD, Lennette Bihari ([id number]) on 06/29/2014 11:18:39  PM      MDM   Final diagnoses:  DOE (dyspnea on exertion)  Other fatigue  Bradycardia  Second degree AV block, Mobitz type II   Plan admission  Rolland Porter, MD, FACEP   I personally performed the services described in this documentation, which was scribed in my presence. The recorded information has been reviewed and considered.  Rolland Porter, MD, FACEP      Janice Norrie, MD 06/30/14 [id number]  Janice Norrie, MD 06/30/14 [phone]

## 2014-06-30 ENCOUNTER — Encounter (HOSPITAL_COMMUNITY): Payer: Self-pay | Admitting: Internal Medicine

## 2014-06-30 DIAGNOSIS — R001 Bradycardia, unspecified: Secondary | ICD-10-CM

## 2014-06-30 DIAGNOSIS — I441 Atrioventricular block, second degree: Secondary | ICD-10-CM | POA: Diagnosis present

## 2014-06-30 DIAGNOSIS — E119 Type 2 diabetes mellitus without complications: Secondary | ICD-10-CM | POA: Diagnosis present

## 2014-06-30 DIAGNOSIS — I10 Essential (primary) hypertension: Secondary | ICD-10-CM | POA: Diagnosis present

## 2014-06-30 DIAGNOSIS — E780 Pure hypercholesterolemia: Secondary | ICD-10-CM | POA: Diagnosis present

## 2014-06-30 DIAGNOSIS — Z7982 Long term (current) use of aspirin: Secondary | ICD-10-CM | POA: Diagnosis not present

## 2014-06-30 DIAGNOSIS — I081 Rheumatic disorders of both mitral and tricuspid valves: Secondary | ICD-10-CM | POA: Diagnosis present

## 2014-06-30 DIAGNOSIS — R0602 Shortness of breath: Secondary | ICD-10-CM

## 2014-06-30 DIAGNOSIS — I447 Left bundle-branch block, unspecified: Secondary | ICD-10-CM | POA: Diagnosis present

## 2014-06-30 DIAGNOSIS — I071 Rheumatic tricuspid insufficiency: Secondary | ICD-10-CM

## 2014-06-30 DIAGNOSIS — G4730 Sleep apnea, unspecified: Secondary | ICD-10-CM

## 2014-06-30 DIAGNOSIS — I340 Nonrheumatic mitral (valve) insufficiency: Secondary | ICD-10-CM

## 2014-06-30 DIAGNOSIS — Z91048 Other nonmedicinal substance allergy status: Secondary | ICD-10-CM | POA: Diagnosis not present

## 2014-06-30 DIAGNOSIS — I059 Rheumatic mitral valve disease, unspecified: Secondary | ICD-10-CM

## 2014-06-30 DIAGNOSIS — Z887 Allergy status to serum and vaccine status: Secondary | ICD-10-CM | POA: Diagnosis not present

## 2014-06-30 DIAGNOSIS — Z888 Allergy status to other drugs, medicaments and biological substances status: Secondary | ICD-10-CM | POA: Diagnosis not present

## 2014-06-30 DIAGNOSIS — R944 Abnormal results of kidney function studies: Secondary | ICD-10-CM | POA: Diagnosis present

## 2014-06-30 DIAGNOSIS — G4733 Obstructive sleep apnea (adult) (pediatric): Secondary | ICD-10-CM | POA: Diagnosis present

## 2014-06-30 HISTORY — DX: Bradycardia, unspecified: R00.1

## 2014-06-30 LAB — GLUCOSE, CAPILLARY
Glucose-Capillary: 95 mg/dL (ref 70–99)
Glucose-Capillary: 116 mg/dL — ABNORMAL HIGH (ref 70–99)
Glucose-Capillary: 87 mg/dL (ref 70–99)
Glucose-Capillary: 94 mg/dL (ref 70–99)

## 2014-06-30 LAB — CBC
HCT: 37.1 % — ABNORMAL LOW (ref 39.0–52.0)
Hemoglobin: 12.2 g/dL — ABNORMAL LOW (ref 13.0–17.0)
MCH: 29.6 pg (ref 26.0–34.0)
MCHC: 32.9 g/dL (ref 30.0–36.0)
MCV: 90 fL (ref 78.0–100.0)
Platelets: 140 10*3/uL — ABNORMAL LOW (ref 150–400)
RBC: 4.12 MIL/uL — ABNORMAL LOW (ref 4.22–5.81)
RDW: 13.9 % (ref 11.5–15.5)
WBC: 4.3 10*3/uL (ref 4.0–10.5)

## 2014-06-30 LAB — BASIC METABOLIC PANEL
Anion gap: 11 (ref 5–15)
BUN: 25 mg/dL — ABNORMAL HIGH (ref 6–23)
CO2: 24 mEq/L (ref 19–32)
Calcium: 9 mg/dL (ref 8.4–10.5)
Chloride: 104 mEq/L (ref 96–112)
Creatinine, Ser: 1.19 mg/dL (ref 0.50–1.35)
GFR calc Af Amer: 71 mL/min — ABNORMAL LOW (ref >90)
GFR calc non Af Amer: 61 mL/min — ABNORMAL LOW (ref >90)
Glucose, Bld: 106 mg/dL — ABNORMAL HIGH (ref 70–99)
Potassium: 4 mEq/L (ref 3.7–5.3)
Sodium: 139 mEq/L (ref 137–147)

## 2014-06-30 LAB — TROPONIN I: Troponin I: <0.3 ng/mL (ref <0.30)

## 2014-06-30 LAB — MAGNESIUM: Magnesium: 1.9 mg/dL (ref 1.5–2.5)

## 2014-06-30 LAB — PRO B NATRIURETIC PEPTIDE: Pro B Natriuretic peptide (BNP): 86.6 pg/mL (ref 0–125)

## 2014-06-30 MED ORDER — CHLORHEXIDINE GLUCONATE 4 % EX LIQD
60.0000 mL | Freq: Once | CUTANEOUS | Status: AC
Start: 2014-06-30 — End: 2014-06-30
  Administered 2014-06-30: 4 via TOPICAL
  Filled 2014-06-30: qty 60

## 2014-06-30 MED ORDER — ASPIRIN EC 81 MG PO TBEC: 81.0000 mg | DELAYED_RELEASE_TABLET | Freq: Every day | ORAL | Status: DC

## 2014-06-30 MED ORDER — ATORVASTATIN CALCIUM 10 MG PO TABS
10.0000 mg | ORAL_TABLET | Freq: Every day | ORAL | Status: DC
  Administered 2014-06-30: 10 mg via ORAL
  Filled 2014-06-30: qty 1

## 2014-06-30 MED ORDER — OMEGA-3 FATTY ACIDS 1000 MG PO CAPS: 3.0000 g | ORAL_CAPSULE | Freq: Every day | ORAL | Status: DC

## 2014-06-30 MED ORDER — OMEGA-3-ACID ETHYL ESTERS 1 G PO CAPS
2.0000 g | ORAL_CAPSULE | Freq: Every day | ORAL | Status: DC
  Administered 2014-06-30: 2 g via ORAL
  Filled 2014-06-30: qty 2

## 2014-06-30 MED ORDER — COQ10 200 MG PO CAPS: 2.0000 | ORAL_CAPSULE | Freq: Every day | ORAL | Status: DC

## 2014-06-30 MED ORDER — SODIUM CHLORIDE 0.45 % IV SOLN: INTRAVENOUS | Status: DC

## 2014-06-30 MED ORDER — SODIUM CHLORIDE 0.9 % IV SOLN
INTRAVENOUS | Status: DC
  Administered 2014-07-01: 06:00:00 via INTRAVENOUS

## 2014-06-30 MED ORDER — ENOXAPARIN SODIUM 40 MG/0.4ML ~~LOC~~ SOLN
40.0000 mg | SUBCUTANEOUS | Status: DC
  Administered 2014-06-30: 40 mg via SUBCUTANEOUS
  Filled 2014-06-30: qty 0.4

## 2014-06-30 MED ORDER — FOLIC ACID 0.5 MG HALF TAB
400.0000 ug | ORAL_TABLET | Freq: Every day | ORAL | Status: DC
  Administered 2014-06-30: 0.5 mg via ORAL
  Filled 2014-06-30 (×3): qty 1

## 2014-06-30 MED ORDER — ACETAMINOPHEN 325 MG PO TABS: 650.0000 mg | ORAL_TABLET | ORAL | Status: DC | PRN

## 2014-06-30 MED ORDER — ADULT MULTIVITAMIN W/MINERALS CH
1.0000 | ORAL_TABLET | Freq: Every day | ORAL | Status: DC
  Administered 2014-06-30: 1 via ORAL
  Filled 2014-06-30 (×3): qty 1

## 2014-06-30 MED ORDER — METFORMIN HCL ER 500 MG PO TB24
500.0000 mg | ORAL_TABLET | Freq: Every day | ORAL | Status: DC
  Administered 2014-06-30: 500 mg via ORAL
  Filled 2014-06-30 (×4): qty 1

## 2014-06-30 MED ORDER — VITAMIN C 500 MG PO TABS
500.0000 mg | ORAL_TABLET | Freq: Every day | ORAL | Status: DC
  Administered 2014-06-30: 500 mg via ORAL
  Filled 2014-06-30: qty 1

## 2014-06-30 MED ORDER — LISINOPRIL 5 MG PO TABS
5.0000 mg | ORAL_TABLET | Freq: Every day | ORAL | Status: DC
  Administered 2014-06-30: 5 mg via ORAL
  Filled 2014-06-30: qty 1

## 2014-06-30 MED ORDER — TICAGRELOR 90 MG PO TABS: 90.0000 mg | ORAL_TABLET | Freq: Two times a day (BID) | ORAL | Status: DC

## 2014-06-30 MED ORDER — ONDANSETRON HCL 4 MG/2ML IJ SOLN: 4.0000 mg | Freq: Four times a day (QID) | INTRAMUSCULAR | Status: DC | PRN

## 2014-06-30 MED ORDER — ZOLPIDEM TARTRATE 5 MG PO TABS: 5.0000 mg | ORAL_TABLET | Freq: Every evening | ORAL | Status: DC | PRN

## 2014-06-30 NOTE — H&P (Addendum)
[name]: Alexander Curtis MRN: [id number], DOB/AGE: 69-05-1946   Admit date: 06/29/2014   Primary Physician: Vikki Ports, MD Primary Cardiologist: None  Pt. Profile:  68M with HLD, DM, intermittent LBBB, prior syncope, who presents with DOE, fatigue and high grade AV block.   Problem List  Past Medical History  Diagnosis Date  . Pure hypercholesterolemia   . Syncope 2011    negative workup except LBBB  . LBBB (left bundle branch block)     had cardiology eval.  Seems to occur with exertion, not at rest (never had cath)  . Impaired fasting glucose 2013  . Hypertension 05/2013  . Type II or unspecified type diabetes mellitus without mention of complication, not stated as uncontrolled     A1c 6.7 at [place] in 2013 (after a vacation)    Past Surgical History  Procedure Laterality Date  . Knee arthroscopy  02/2011    L knee for meniscal tear. [name]  . Inguinal hernia repair      bilateral  . Finger tendon repair      R 2nd and 3rd finger  . Uvulectomy      laser treatment for sleep apnea (NOT UP3)  . Vasectomy       Allergies  Allergies  Allergen Reactions  . Horse-Derived Products     REACTION: Anaphlactic Reaction.  Can't take tetanus shot  . Pravastatin Other (See Comments)    Joint pain   . Adhesive [Tape] Itching and Rash    HPI  68M with HLD, DM, intermittent LBBB, prior syncope, who presents with DOE, fatigue and high grade AV block. Mr. Lewallen was in his USOH until a few weeks ago at which point a [place] provider noticed hypertension to the 180s which was a new finding.  He was instructed to check his BPs at home and take a log. 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 Over the past 2 weeks he developed progressive DOE and fatigue with some exertional lightheadedness. No syncope or pre-syncope. No chest pain, LE edema, PND, orthopnea. He was seen  by his PCP yesterday and ECG demonstrated 2:1 AV block. The ECG was faxed to Dr. Irish Lack who saw the ECG and stated that the patient would eventually need a pacemaker. He scheduled an appointment for this upcoming week and was instructed to come to the ED of he felt worse. TSH was checked yesterday and was 1.405.  Today the patient had a pulse in the upper 20s; given the regularity the wife was concerned for CHB and that patient reported to the ER. In the ED, he was bradycardic to the 30s and hypertensive to the 170s. ECG demonstrated 2:! AV block. Labs notable for Cr 1.5 (up from 0.96), K 4.2, Mg 1.9. Tele demonstrates predominantly 2:1 AV block with occasional evidence of higher grade AV block. Also intermittent aberrantly conducted beats of LBBB morphology. He is not on any nodal agents. No history of tick bites. No family history of conduction disease or pacemakers.   Of note, in 2011, Mr. Augusto was worked up for a syncopal episode at [facility]. He underwent a TTE (normal EF) and lexiscan (normal). He was noted to have intermittent LBBB during the stress test. Etiology was most likely vasovagal per wife.   Home Medications  Prior to Admission medications   Medication Sig Start Date End Date Taking? Authorizing  Provider  ascorbic acid (VITAMIN C) 100 MG tablet Take 500 mg by mouth daily.   Yes Historical Provider, MD  aspirin 81 MG tablet Take 81 mg by mouth daily.   Yes Historical Provider, MD  atorvastatin (LIPITOR) 10 MG tablet Take 1 tablet (10 mg total) by mouth daily. 01/18/14  Yes Rita Ohara, MD  CINNAMON PO Take 1 capsule by mouth 3 (three) times daily.   Yes Historical Provider, MD  Coenzyme Q10 (COQ10) 200 MG CAPS Take 2 capsules by mouth daily.   Yes Historical Provider, MD  fish oil-omega-3 fatty acids 1000 MG capsule Take 3 g by mouth daily.    Yes Historical Provider, MD  folic acid (FOLVITE) 323 MCG tablet Take 400 mcg by mouth daily.   Yes Historical Provider, MD  lisinopril  (PRINIVIL,ZESTRIL) 5 MG tablet Take 5 mg by mouth daily.   Yes Historical Provider, MD  metFORMIN (GLUCOPHAGE-XR) 500 MG 24 hr tablet Take 1 tablet (500 mg total) by mouth daily with breakfast. 05/21/14  Yes Rita Ohara, MD  Multiple Vitamins-Minerals (MENS 50+ MULTI VITAMIN/MIN PO) Take 1 tablet by mouth daily.   Yes Historical Provider, MD  eszopiclone (LUNESTA) 2 MG TABS tablet Take 1 tablet (2 mg total) by mouth at bedtime as needed for sleep. Take immediately before bedtime Patient not taking: Reported on 06/13/2014 04/12/14   Rita Ohara, MD  lisinopril (PRINIVIL,ZESTRIL) 5 MG tablet TAKE 1 TABLET BY MOUTH EVERY DAY 05/21/14   Rita Ohara, MD    Family History  Family History  Problem Relation Age of Onset  . Dementia Mother   . Cancer Mother     ?type, was told it contributed to death, per pt  . Melanoma Mother 65  . Stroke Father 91    cerebral aneurysm  . Diabetes Sister     borderline, obese  . Dementia Paternal Aunt   . Diabetes Maternal Grandmother   . Heart disease Maternal Grandfather     Social History  History   Social History  . Marital Status: Married    Spouse Name: N/A    Number of Children: 4  . Years of Education: N/A   Occupational History  . retired    Social History Main Topics  . Smoking status: Never Smoker   . Smokeless tobacco: Never Used  . Alcohol Use: Yes     Comment: 3-4 drinks per month.  . Drug Use: No  . Sexual Activity: Not on file   Other Topics Concern  . Not on file   Social History Narrative   Lives with wife, cat.  Daughter, Junie Panning, in [place], 2 children in [place], son in [place].   10 grandchildren     Review of Systems General:  No chills, fever, night sweats or weight changes. + fatigue Cardiovascular:  No chest pain,edema, orthopnea, palpitations, paroxysmal nocturnal dyspnea. + DOE Dermatological: No rash, lesions/masses Respiratory: No cough, dyspnea Urologic: No hematuria, dysuria Abdominal:   No nausea, vomiting, diarrhea,  bright red blood per rectum, melena, or hematemesis Neurologic:  No visual changes, wkns, changes in mental status. All other systems reviewed and are otherwise negative except as noted above.  Physical Exam  Blood pressure 148/51, pulse 34, temperature 98 F (36.7 C), resp. rate 12, height 6\' 1"  (1.854 m), weight 89.359 kg (197 lb), SpO2 94 %.  General: Pleasant, NAD Psych: Normal affect. Neuro: Alert and oriented X 3. Moves all extremities spontaneously. HEENT: Normal  Neck: Supple without bruits or JVD. Lungs:  Resp regular and unlabored, CTA. Heart: Bradycardic, regular no murmurs.  Abdomen: Soft, non-tender, non-distended, BS + x 4.  Extremities: No clubbing, cyanosis or edema. DP/PT/Radials 2+ and equal bilaterally.  Labs  Troponin [facility] of Care Test)  Recent Labs  06/29/14 2335  TROPIPOC 0.01   No results for input(s): CKTOTAL, CKMB, TROPONINI in the last 72 hours. Lab Results  Component Value Date   WBC 4.6 06/28/2014   HGB 14.3 06/29/2014   HCT 42.0 06/29/2014   MCV 89.4 06/28/2014   PLT 162 06/28/2014    Recent Labs Lab 06/28/14 1436 06/29/14 2337  NA 139 141  K 4.1 4.2  CL 105 104  CO2 28  --   BUN 17 28*  CREATININE 0.96 1.50*  CALCIUM 9.3  --   PROT 6.6  --   BILITOT 0.5  --   ALKPHOS 53  --   ALT 25  --   AST 21  --   GLUCOSE 92 117*   Lab Results  Component Value Date   CHOL 174 04/09/2014   HDL 48 04/09/2014   LDLCALC 116* 04/09/2014   TRIG 48 04/09/2014   Lab Results  Component Value Date   DDIMER  11/24/2009    <0.22        AT THE INHOUSE ESTABLISHED CUTOFF VALUE OF 0.48 ug/mL FEU, THIS ASSAY HAS BEEN DOCUMENTED IN THE LITERATURE TO HAVE A SENSITIVITY AND NEGATIVE PREDICTIVE VALUE OF AT LEAST 98 TO 99%.  THE TEST RESULT SHOULD BE CORRELATED WITH AN ASSESSMENT OF THE CLINICAL PROBABILITY OF DVT / VTE.     Radiology/Studies  Dg Chest 2 View  06/26/2014   CLINICAL DATA:  Elevated blood pressure.  Shortness of breath.   EXAM: CHEST  2 VIEW  COMPARISON:  11/23/2009.  FINDINGS: Mediastinum and hilar structures are normal the lungs are clear. Heart size normal. No pleural effusion or pneumothorax. Degenerative changes thoracic spine.  IMPRESSION: No acute cardiopulmonary disease.   Electronically Signed   By: Marcello Moores  Register   On: 06/26/2014 11:53    ECG  06/29/14: 2:1 AV block. Ventricular rate of 36. Sub mm interolateral ST depression. No priors for comparison.    TTE 11/24/09 Study Conclusions   - Left ventricle: The cavity size was normal. Wall thickness was    increased in a pattern of mild LVH. Systolic function was normal.    The estimated ejection fraction was in the range of 55% to 60%.    Wall motion was normal; there were no regional wall motion    abnormalities.  - Aortic valve: Mildly calcified annulus. Trileaflet.   Lexiscan 02/13/10  History Comments: 05/11 ECHO: EF= 55%, mild LVH   Symptoms: Diaphoresis, Dizziness, Light-Headedness, Syncope       Nuclear Pre-Procedure Cardiac Risk Factors: LBBB, Lipids Caffeine/Decaff Intake: None NPO After: 7:00 PM Lungs: Clear.  O2 Sat 98% on RA. IV 0.9% NS with Angio Cath: 22g     IV Site: (R) AC IV Started by: Irven Baltimore RN Chest Size (in) 42     Height (in): 74 Weight (lb): 220 BMI: 28.35   Nuclear Med Study 1 or 2 day study:  1 day     Stress Test Type:  Carlton Adam Reading MD:  Dorris Carnes, MD     Referring MD:  Milana Huntsman, MD ([place] [phone]) Resting Radionuclide:  Technetium 63m Tetrofosmin     Resting Radionuclide Dose:  10.7 mCi   Stress Radionuclide:  Technetium 37m Tetrofosmin  Stress Radionuclide Dose:  31.7 mCi     Stress Protocol     Lexiscan: 0.4 mg     Stress Test Technologist:  Valetta Fuller CMA-N     Nuclear Technologist:  Charlton Amor CNMT   Rest Procedure   Myocardial perfusion imaging was performed at rest 45 minutes following the intravenous administration of Myoview Technetium 72m  Tetrofosmin.   Stress Procedure   The patient received IV Lexiscan 0.4 mg over 15-seconds.  Myoview injected at 30-seconds.  There were no significant changes with infusion, other than patient developing LBBB in recovery.  He did c/o chest pressure with infusion.  Quantitative spect images were obtained after a 45 minute delay.   QPS Raw Data Images:  Rest images were motion corrected.  Soft tissue (diaphragm, bowel acitvity) underlie heart. Stress Images:  Thinning in the inferoseptal wall (base) and apex..  Otherwise normal perfusion. Rest Images:  NO signficant change from the stress images. Subtraction (SDS):  No evidence of ischemia. Transient Ischemic Dilatation:  1.05  (Normal <1.22)  Lung/Heart Ratio:  .32  (Normal <0.45)   Quantitative Gated Spect Images QGS EDV:  119 ml QGS ESV:  61 ml QGS EF:  49 % QGS cine images:  By visual estimate appears slightly better.     Overall Impression   Exercise Capacity: Lexiscan protocol. BP Response: Normal blood pressure response. Clinical Symptoms: Mild chest pressure. ECG Impression: Nondiagnostic as patieitn developed transient LBBB during study. Overall Impression: Normal perfusioin.  No evidence of significant ischemia. Overall Impression Comments: would recommend echo if not done to fully evaluate wall motion, LV function.      ASSESSMENT AND PLAN  65M with HLD, DM, intermittent LBBB, prior syncope, who presents with DOE, fatigue and high grade AV block. He has no identifiable reversible causes for the symptomatic bradycardia.  He will require a TTE to evaluation for LV function. Based on EF, he meets criteria for either a dual chamber or CRT device. He he has LV dysfunction an ischemic work-up would be warranted despite no history of angina, in light of risk factors.  He has a slight increase in his Cr for unclear reasons; will need to monitor.   Signed, Lamar Sprinkles, MD 06/30/2014, 1:25 AM

## 2014-06-30 NOTE — Consult Note (Signed)
ELECTROPHYSIOLOGY CONSULT NOTE    Primary Care Physician: Vikki Ports, MD Referring Physician:  Dr Irish Lack  Admit Date: 06/29/2014  Reason for consultation:  AV block  Alexander Curtis is a 68 y.o. male with a h/o prior syncope and intermittent LBBB, HTN, and DM who is admitted with symptomatic 2:1 AV block.  The patient reports progressive fatigue and decreased exercise tolerance for a week.  He was evaluated by Dr Tomi Bamberger and found to have 2:1 AV block.  Dr Tomi Bamberger reviewed the ekg with Dr Irish Lack and the patient was referred to EP with plans to see me next week.  Unfortunately, he continues to have worsening bradycardia with further fatigue.  He was therefore brought to [facility] where he was confirmed to have ongoing second degree AV block.  He is admitted for further evaluation. Echo reveals normal EF with mild LVH, severe LAE, moderate MR, and moderate TR. The patient denies ischemic symptoms. He did have syncope in 2011.  His wife reports that he was seated at church and had abrupt LOC.  Workup at that time revealed intermittent LBBB.  He had a normal myoview at that time.  Today, he denies symptoms of palpitations, chest pain,  orthopnea, PND, lower extremity edema, dizziness, presyncope, syncope, or neurologic sequela. The patient is tolerating medications without difficulties and is otherwise without complaint today.   Past Medical History  Diagnosis Date  . Pure hypercholesterolemia   . Syncope 2011    negative workup except LBBB  . LBBB (left bundle branch block)     had cardiology eval.  Seems to occur with exertion, not at rest (never had cath)  . Hypertension 05/2013  . Type II or unspecified type diabetes mellitus without mention of complication, not stated as uncontrolled     A1c 6.7 at [place] in 2013 (after a vacation)  . Obstructive sleep apnea     not compliant with CPAP  . Atrial enlargement, left   . Mitral regurgitation   . Tricuspid regurgitation    Past Surgical  History  Procedure Laterality Date  . Knee arthroscopy  02/2011    L knee for meniscal tear. [name]  . Inguinal hernia repair      bilateral  . Finger tendon repair      R 2nd and 3rd finger  . Uvulectomy      laser treatment for sleep apnea (NOT UP3)  . Vasectomy      . [START ON 07/01/2014] aspirin EC  81 mg Oral Daily  . atorvastatin  10 mg Oral Daily  . enoxaparin (LOVENOX) injection  40 mg Subcutaneous Q24H  . folic acid  086 mcg Oral Daily  . lisinopril  5 mg Oral Daily  . metFORMIN  500 mg Oral Q breakfast  . multivitamin with minerals  1 tablet Oral Daily  . omega-3 acid ethyl esters  2 g Oral Daily  . ascorbic acid  500 mg Oral Daily      Allergies  Allergen Reactions  . Horse-Derived Products     REACTION: Anaphlactic Reaction.  Can't take tetanus shot  . Pravastatin Other (See Comments)    Joint pain   . Adhesive [Tape] Itching and Rash    History   Social History  . Marital Status: Married    Spouse Name: N/A    Number of Children: 4  . Years of Education: N/A   Occupational History  . retired    Social History Main Topics  . Smoking  status: Never Smoker   . Smokeless tobacco: Never Used  . Alcohol Use: Yes     Comment: 3-4 drinks per month.  . Drug Use: No  . Sexual Activity: Not on file   Other Topics Concern  . Not on file   Social History Narrative   Lives with wife, cat.  Daughter, Junie Panning, in [place], 2 children in [place], son in [place].   10 grandchildren   Retired school principal       Family History  Problem Relation Age of Onset  . Dementia Mother   . Cancer Mother     ?type, was told it contributed to death, per pt  . Melanoma Mother 23  . Stroke Father 75    cerebral aneurysm  . Diabetes Sister     borderline, obese  . Dementia Paternal Aunt   . Diabetes Maternal Grandmother   . Heart disease Maternal Grandfather   Denies FH of AV block, sudden death, or arrhythmia  ROS- All systems are reviewed and negative except as  per the HPI above  Physical Exam: Telemetry:  Sinus rhythm with 2:1 AV block Filed Vitals:   06/30/14 0800 06/30/14 1044 06/30/14 1200 06/30/14 1600  BP: 154/62 151/65 166/75 150/62  Pulse: 40  39 37  Temp: 97.7 F (36.5 C)  97.9 F (36.6 C) 98 F (36.7 C)  TempSrc: Oral  Oral Oral  Resp: 18  16 18   Height:      Weight:      SpO2: 98%  97% 96%    GEN- The patient is well appearing, alert and oriented x 3 today.   Head- normocephalic, atraumatic Eyes-  Sclera clear, conjunctiva pink Ears- hearing intact Oropharynx- clear Neck- supple, no JVP Lymph- no cervical lymphadenopathy Lungs- Clear to ausculation bilaterally, normal work of breathing Heart- very bradycardic rhythm with decreased HS,  I do not appreciate MR or TR at this time.  GI- soft, NT, ND, + BS Extremities- no clubbing, cyanosis, or edema MS- no significant deformity or atrophy Skin- no rash or lesion Psych- euthymic mood, full affect Neuro- strength and sensation are intact  EKG reveals sinus rhythm with 2:1 AV block  Labs:   Lab Results  Component Value Date   WBC 4.3 06/30/2014   HGB 12.2* 06/30/2014   HCT 37.1* 06/30/2014   MCV 90.0 06/30/2014   PLT 140* 06/30/2014    Recent Labs Lab 06/28/14 1436  06/30/14 0445  NA 139  < > 139  K 4.1  < > 4.0  CL 105  < > 104  CO2 28  --  24  BUN 17  < > 25*  CREATININE 0.96  < > 1.19  CALCIUM 9.3  --  9.0  PROT 6.6  --   --   BILITOT 0.5  --   --   ALKPHOS 53  --   --   ALT 25  --   --   AST 21  --   --   GLUCOSE 92  < > 106*  < > = values in this interval not displayed. Lab Results  Component Value Date   CKTOTAL 184 11/23/2009   CKMB 3.7 11/23/2009   TROPONINI <0.30 06/30/2014    Lab Results  Component Value Date   CHOL 174 04/09/2014   CHOL 140 01/08/2014   CHOL 184 11/13/2013   Lab Results  Component Value Date   HDL 48 04/09/2014   HDL 49 01/08/2014   HDL 53 11/13/2013  Lab Results  Component Value Date   LDLCALC 116*  04/09/2014   LDLCALC 82 01/08/2014   LDLCALC 114* 11/13/2013   Lab Results  Component Value Date   TRIG 48 04/09/2014   TRIG 43 01/08/2014   TRIG 84 11/13/2013   Lab Results  Component Value Date   CHOLHDL 3.6 04/09/2014   CHOLHDL 2.9 01/08/2014   CHOLHDL 3.5 11/13/2013   No results found for: [facility]    Epic records reviewed and patient discussed with Dr Irish Lack  ASSESSMENT AND PLAN:   1. Second degree AV block The patient has symptomatic 2:1 AV block.  He has a h/o conduction system disease with LBBB and prior syncope.  No reversible causes are found.  He presently does not have symptoms of ischemia and CMs have been negative.  I have reviewed the echo findings with Dr Debara Pickett by phone who does not feel that there are wall motion changes or reduction in EF to suggest ischemia.  I would therefore recommend pacemaker implantation at this time.  Risks, benefits, alternatives to pacemaker implantation were discussed in detail with the patient today. The patient understands that the risks include but are not limited to bleeding, infection, pneumothorax, perforation, tamponade, vascular damage, renal failure, MI, stroke, death,  and lead dislodgement and wishes to proceed. We will therefore schedule the procedure at the next available time.  I will see if we can arrange cath staffing to proceed with the procedure tomorrow.  I worry that this patient is at risk of decompensation/ clinical deterioration with his persistent AV block.  2. OSA/ severe LAE Compliance with CPAP is encouraged He is planning to see Dr Erik Obey soon to initiate treatment  3. SOB Possibly due to AV block If decreased exercise tolerance does not improve with pacing, will follow-up with outpatient myoview.  4. HTN Stable No change required today  5. MR/TR These are new findings Would repeat echo 2-3 months after initiation of pacing and with further medical therapy for HTN Would favor aggressive ACE inhibitor/  ARB therapy to hopefully help with atrial remodeling.     [name], MD 06/30/2014  8:30 PM

## 2014-06-30 NOTE — Progress Notes (Signed)
  Echocardiogram 2D Echocardiogram has been performed.  Alexander Curtis 06/30/2014, 11:51 AM

## 2014-06-30 NOTE — Progress Notes (Signed)
Patient with 2:1 block and DOE.  BP stable.  No lightheadedness or syncope.  No CP.  EP aware of patient.  He was scheduled to see them on Monday.   Discussed with Dr. Rayann Heman.  Discussed cardiac cath with the patient as well.  Await echo result.  If there is LV dysfunction, then would consider cath.  Check lyme titer given that he is young for conduction disease.  Jettie Booze, MD

## 2014-06-30 NOTE — Progress Notes (Signed)
PHARMACIST - PHYSICIAN ORDER COMMUNICATION  CONCERNING: P&T Medication Policy on Herbal Medications  DESCRIPTION:  This patient's order for:  Co q 10 200mg   has been noted.  This product(s) is classified as an "herbal" or natural product. Due to a lack of definitive safety studies or FDA approval, nonstandard manufacturing practices, plus the potential risk of unknown drug-drug interactions while on inpatient medications, the Pharmacy and Therapeutics Committee does not permit the use of "herbal" or natural products of this type within [facility].   ACTION TAKEN: The pharmacy department is unable to verify this order at this time and your patient has been informed of this safety policy. Please reevaluate patient's clinical condition at discharge and address if the herbal or natural product(s) should be resumed at that time.

## 2014-07-01 ENCOUNTER — Encounter (HOSPITAL_COMMUNITY)
Admission: EM | Disposition: A | Discharge status: Home / Self Care | Payer: Self-pay | Source: Home / Self Care | Attending: Cardiology

## 2014-07-01 ENCOUNTER — Encounter (HOSPITAL_COMMUNITY): Payer: Self-pay | Admitting: Internal Medicine

## 2014-07-01 ENCOUNTER — Ambulatory Visit (HOSPITAL_COMMUNITY): Admit: 2014-07-01 | Payer: Self-pay | Admitting: Internal Medicine

## 2014-07-01 DIAGNOSIS — I441 Atrioventricular block, second degree: Secondary | ICD-10-CM

## 2014-07-01 HISTORY — PX: PERMANENT PACEMAKER INSERTION: SHX5480

## 2014-07-01 LAB — GLUCOSE, CAPILLARY
Glucose-Capillary: 99 mg/dL (ref 70–99)
Glucose-Capillary: 82 mg/dL (ref 70–99)
Glucose-Capillary: 96 mg/dL (ref 70–99)
Glucose-Capillary: 149 mg/dL — ABNORMAL HIGH (ref 70–99)

## 2014-07-01 SURGERY — PERMANENT PACEMAKER INSERTION
Anesthesia: LOCAL

## 2014-07-01 MED ORDER — SODIUM CHLORIDE 0.9 % IJ SOLN: 3.0000 mL | INTRAMUSCULAR | Status: DC | PRN

## 2014-07-01 MED ORDER — SODIUM CHLORIDE 0.9 % IR SOLN
80.0000 mg | Status: DC
  Filled 2014-07-01: qty 2

## 2014-07-01 MED ORDER — CEFAZOLIN SODIUM-DEXTROSE 2-3 GM-% IV SOLR
2.0000 g | INTRAVENOUS | Status: DC
  Filled 2014-07-01: qty 50

## 2014-07-01 MED ORDER — CHLORHEXIDINE GLUCONATE 4 % EX LIQD
60.0000 mL | Freq: Once | CUTANEOUS | Status: AC
  Administered 2014-07-01: 4 via TOPICAL
  Filled 2014-07-01: qty 60

## 2014-07-01 MED ORDER — LISINOPRIL 10 MG PO TABS: 10.0000 mg | ORAL_TABLET | Freq: Every day | ORAL | Status: DC

## 2014-07-01 MED ORDER — CEFAZOLIN SODIUM 1-5 GM-% IV SOLN
1.0000 g | Freq: Four times a day (QID) | INTRAVENOUS | Status: AC
  Administered 2014-07-01 – 2014-07-02 (×3): 1 g via INTRAVENOUS
  Filled 2014-07-01 (×3): qty 50

## 2014-07-01 MED ORDER — SODIUM CHLORIDE 0.9 % IJ SOLN
3.0000 mL | Freq: Two times a day (BID) | INTRAMUSCULAR | Status: DC
  Administered 2014-07-01 – 2014-07-02 (×2): 3 mL via INTRAVENOUS

## 2014-07-01 MED ORDER — LIDOCAINE HCL (PF) 1 % IJ SOLN
INTRAMUSCULAR | Status: AC
  Filled 2014-07-01: qty 60

## 2014-07-01 MED ORDER — ACETAMINOPHEN 325 MG PO TABS: 325.0000 mg | ORAL_TABLET | ORAL | Status: DC | PRN

## 2014-07-01 MED ORDER — HYDROCODONE-ACETAMINOPHEN 5-325 MG PO TABS
1.0000 | ORAL_TABLET | ORAL | Status: DC | PRN
  Administered 2014-07-01: 2 via ORAL
  Administered 2014-07-01 (×2): 1 via ORAL
  Filled 2014-07-01: qty 1
  Filled 2014-07-01: qty 2
  Filled 2014-07-01 (×2): qty 1

## 2014-07-01 MED ORDER — SODIUM CHLORIDE 0.9 % IV SOLN: 250.0000 mL | INTRAVENOUS | Status: DC | PRN

## 2014-07-01 MED ORDER — ADULT MULTIVITAMIN W/MINERALS CH: 1.0000 | ORAL_TABLET | Freq: Every day | ORAL | Status: DC

## 2014-07-01 MED ORDER — YOU HAVE A PACEMAKER BOOK
Freq: Once | Status: AC
  Administered 2014-07-01: 06:00:00
  Filled 2014-07-01: qty 1

## 2014-07-01 MED ORDER — ONDANSETRON HCL 4 MG/2ML IJ SOLN: 4.0000 mg | Freq: Four times a day (QID) | INTRAMUSCULAR | Status: DC | PRN

## 2014-07-01 NOTE — Progress Notes (Signed)
Utilization Review Completed.Alexander Curtis T12/20/2015  

## 2014-07-01 NOTE — Progress Notes (Signed)
SUBJECTIVE: The patient is doing well today.  At this time, he denies chest pain, shortness of breath, or any new concerns.  Marland Kitchen aspirin EC  81 mg Oral Daily  . atorvastatin  10 mg Oral Daily  .  ceFAZolin (ANCEF) IV  2 g Intravenous On Call  . folic acid  563 mcg Oral Daily  . gentamicin irrigation  80 mg Irrigation On Call  . lisinopril  5 mg Oral Daily  . metFORMIN  500 mg Oral Q breakfast  . multivitamin with minerals  1 tablet Oral Daily  . omega-3 acid ethyl esters  2 g Oral Daily  . ascorbic acid  500 mg Oral Daily   . sodium chloride    . sodium chloride 50 mL/hr at 07/01/14 0623    OBJECTIVE: Physical Exam: Filed Vitals:   06/30/14 1600 06/30/14 2124 07/01/14 0000 07/01/14 0400  BP: 150/62 159/66 134/60 149/58  Pulse: 37 38 52 50  Temp: 98 F (36.7 C) 97.9 F (36.6 C) 97.9 F (36.6 C) 98.1 F (36.7 C)  TempSrc: Oral Oral Oral Oral  Resp: 18 18 18 18   Height:      Weight:    196 lb 14.4 oz (89.313 kg)  SpO2: 96% 100% 99% 96%    Intake/Output Summary (Last 24 hours) at 07/01/14 0845 Last data filed at 06/30/14 1700  Gross per 24 hour  Intake    600 ml  Output      0 ml  Net    600 ml    Telemetry reveals sinus rhythm with 2:1 AV block  GEN- The patient is well appearing, alert and oriented x 3 today.   Head- normocephalic, atraumatic Eyes-  Sclera clear, conjunctiva pink Ears- hearing intact Oropharynx- clear Neck- supple, no JVP Lymph- no cervical lymphadenopathy Lungs- Clear to ausculation bilaterally, normal work of breathing Heart- bradycardic rhythm, no murmurs, rubs or gallops, PMI not laterally displaced GI- soft, NT, ND, + BS Extremities- no clubbing, cyanosis, or edema Skin- no rash or lesion Psych- euthymic mood, full affect Neuro- strength and sensation are intact  LABS: Basic Metabolic Panel:  Recent Labs  06/28/14 1436 06/29/14 2329 06/29/14 2337 06/30/14 0445  NA 139  --  141 139  K 4.1  --  4.2 4.0  CL 105  --  104 104    CO2 28  --   --  24  GLUCOSE 92  --  117* 106*  BUN 17  --  28* 25*  CREATININE 0.96  --  1.50* 1.19  CALCIUM 9.3  --   --  9.0  MG  --  1.9  --   --    Liver Function Tests:  Recent Labs  06/28/14 1436  AST 21  ALT 25  ALKPHOS 53  BILITOT 0.5  PROT 6.6  ALBUMIN 4.2   No results for input(s): LIPASE, AMYLASE in the last 72 hours. CBC:  Recent Labs  06/28/14 1436 06/29/14 2337 06/30/14 0445  WBC 4.6  --  4.3  NEUTROABS 2.9  --   --   HGB 13.1 14.3 12.2*  HCT 40.6 42.0 37.1*  MCV 89.4  --  90.0  PLT 162  --  140*   Cardiac Enzymes:  Recent Labs  06/30/14 1204  TROPONINI <0.30   Thyroid Function Tests:  Recent Labs  06/28/14 1436  TSH 1.405   Anemia Panel: No results for input(s): VITAMINB12, FOLATE, FERRITIN, TIBC, IRON, RETICCTPCT in the last 72 hours.  RADIOLOGY: Dg  Chest 2 View  06/26/2014   CLINICAL DATA:  Elevated blood pressure.  Shortness of breath.  EXAM: CHEST  2 VIEW  COMPARISON:  11/23/2009.  FINDINGS: Mediastinum and hilar structures are normal the lungs are clear. Heart size normal. No pleural effusion or pneumothorax. Degenerative changes thoracic spine.  IMPRESSION: No acute cardiopulmonary disease.   Electronically Signed   By: Marcello Moores  Register   On: 06/26/2014 11:53    ASSESSMENT AND PLAN:  Active Problems:   Bradycardia  1.  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  I would therefore recommend pacemaker implantation at this time.  Risks, benefits, alternatives to pacemaker implantation were discussed in detail with the patient today. The patient understands that the risks include but are not limited to bleeding, infection, pneumothorax, perforation, tamponade, vascular damage, renal failure, MI, stroke, death,  and lead dislodgement and wishes to proceed at this time.    [name], MD 07/01/2014 8:45 AM

## 2014-07-01 NOTE — Progress Notes (Signed)
Chaplain encountered and greeted visiting priest near the main entrance of hospital.  Idelle Crouch indicated that he had been trying to find a patient but was late and had to leave the hospital. Chaplain offered support and [place] ([place] requested that chaplain deliver church newsletter and calendar.  Family welcomed delivery and shared their relief at patient's improved status following treatment. Emotional and spiritual support were provided. Please call if further support is needed.  Dorathy Daft [place], [place]

## 2014-07-01 NOTE — H&P (View-Only) (Signed)
ELECTROPHYSIOLOGY CONSULT NOTE    Primary Care Physician: Vikki Ports, MD Referring Physician:  Dr Irish Lack  Admit Date: 06/29/2014  Reason for consultation:  AV block  Alexander Curtis is a 68 y.o. male with a h/o prior syncope and intermittent LBBB, HTN, and DM who is admitted with symptomatic 2:1 AV block.  The patient reports progressive fatigue and decreased exercise tolerance for a week.  He was evaluated by Dr Tomi Bamberger and found to have 2:1 AV block.  Dr Tomi Bamberger reviewed the ekg with Dr Irish Lack and the patient was referred to EP with plans to see me next week.  Unfortunately, he continues to have worsening bradycardia with further fatigue.  He was therefore brought to [facility] where he was confirmed to have ongoing second degree AV block.  He is admitted for further evaluation. Echo reveals normal EF with mild LVH, severe LAE, moderate MR, and moderate TR. The patient denies ischemic symptoms. He did have syncope in 2011.  His wife reports that he was seated at church and had abrupt LOC.  Workup at that time revealed intermittent LBBB.  He had a normal myoview at that time.  Today, he denies symptoms of palpitations, chest pain,  orthopnea, PND, lower extremity edema, dizziness, presyncope, syncope, or neurologic sequela. The patient is tolerating medications without difficulties and is otherwise without complaint today.   Past Medical History  Diagnosis Date  . Pure hypercholesterolemia   . Syncope 2011    negative workup except LBBB  . LBBB (left bundle branch block)     had cardiology eval.  Seems to occur with exertion, not at rest (never had cath)  . Hypertension 05/2013  . Type II or unspecified type diabetes mellitus without mention of complication, not stated as uncontrolled     A1c 6.7 at [place] in 2013 (after a vacation)  . Obstructive sleep apnea     not compliant with CPAP  . Atrial enlargement, left   . Mitral regurgitation   . Tricuspid regurgitation    Past Surgical  History  Procedure Laterality Date  . Knee arthroscopy  02/2011    L knee for meniscal tear. [name]  . Inguinal hernia repair      bilateral  . Finger tendon repair      R 2nd and 3rd finger  . Uvulectomy      laser treatment for sleep apnea (NOT UP3)  . Vasectomy      . [START ON 07/01/2014] aspirin EC  81 mg Oral Daily  . atorvastatin  10 mg Oral Daily  . enoxaparin (LOVENOX) injection  40 mg Subcutaneous Q24H  . folic acid  564 mcg Oral Daily  . lisinopril  5 mg Oral Daily  . metFORMIN  500 mg Oral Q breakfast  . multivitamin with minerals  1 tablet Oral Daily  . omega-3 acid ethyl esters  2 g Oral Daily  . ascorbic acid  500 mg Oral Daily      Allergies  Allergen Reactions  . Horse-Derived Products     REACTION: Anaphlactic Reaction.  Can't take tetanus shot  . Pravastatin Other (See Comments)    Joint pain   . Adhesive [Tape] Itching and Rash    History   Social History  . Marital Status: Married    Spouse Name: N/A    Number of Children: 4  . Years of Education: N/A   Occupational History  . retired    Social History Main Topics  . Smoking  status: Never Smoker   . Smokeless tobacco: Never Used  . Alcohol Use: Yes     Comment: 3-4 drinks per month.  . Drug Use: No  . Sexual Activity: Not on file   Other Topics Concern  . Not on file   Social History Narrative   Lives with wife, cat.  Daughter, Junie Panning, in [place], 2 children in [place], son in [place].   10 grandchildren   Retired school principal       Family History  Problem Relation Age of Onset  . Dementia Mother   . Cancer Mother     ?type, was told it contributed to death, per pt  . Melanoma Mother 34  . Stroke Father 27    cerebral aneurysm  . Diabetes Sister     borderline, obese  . Dementia Paternal Aunt   . Diabetes Maternal Grandmother   . Heart disease Maternal Grandfather   Denies FH of AV block, sudden death, or arrhythmia  ROS- All systems are reviewed and negative except as  per the HPI above  Physical Exam: Telemetry:  Sinus rhythm with 2:1 AV block Filed Vitals:   06/30/14 0800 06/30/14 1044 06/30/14 1200 06/30/14 1600  BP: 154/62 151/65 166/75 150/62  Pulse: 40  39 37  Temp: 97.7 F (36.5 C)  97.9 F (36.6 C) 98 F (36.7 C)  TempSrc: Oral  Oral Oral  Resp: 18  16 18   Height:      Weight:      SpO2: 98%  97% 96%    GEN- The patient is well appearing, alert and oriented x 3 today.   Head- normocephalic, atraumatic Eyes-  Sclera clear, conjunctiva pink Ears- hearing intact Oropharynx- clear Neck- supple, no JVP Lymph- no cervical lymphadenopathy Lungs- Clear to ausculation bilaterally, normal work of breathing Heart- very bradycardic rhythm with decreased HS,  I do not appreciate MR or TR at this time.  GI- soft, NT, ND, + BS Extremities- no clubbing, cyanosis, or edema MS- no significant deformity or atrophy Skin- no rash or lesion Psych- euthymic mood, full affect Neuro- strength and sensation are intact  EKG reveals sinus rhythm with 2:1 AV block  Labs:   Lab Results  Component Value Date   WBC 4.3 06/30/2014   HGB 12.2* 06/30/2014   HCT 37.1* 06/30/2014   MCV 90.0 06/30/2014   PLT 140* 06/30/2014    Recent Labs Lab 06/28/14 1436  06/30/14 0445  NA 139  < > 139  K 4.1  < > 4.0  CL 105  < > 104  CO2 28  --  24  BUN 17  < > 25*  CREATININE 0.96  < > 1.19  CALCIUM 9.3  --  9.0  PROT 6.6  --   --   BILITOT 0.5  --   --   ALKPHOS 53  --   --   ALT 25  --   --   AST 21  --   --   GLUCOSE 92  < > 106*  < > = values in this interval not displayed. Lab Results  Component Value Date   CKTOTAL 184 11/23/2009   CKMB 3.7 11/23/2009   TROPONINI <0.30 06/30/2014    Lab Results  Component Value Date   CHOL 174 04/09/2014   CHOL 140 01/08/2014   CHOL 184 11/13/2013   Lab Results  Component Value Date   HDL 48 04/09/2014   HDL 49 01/08/2014   HDL 53 11/13/2013  Lab Results  Component Value Date   LDLCALC 116*  04/09/2014   LDLCALC 82 01/08/2014   LDLCALC 114* 11/13/2013   Lab Results  Component Value Date   TRIG 48 04/09/2014   TRIG 43 01/08/2014   TRIG 84 11/13/2013   Lab Results  Component Value Date   CHOLHDL 3.6 04/09/2014   CHOLHDL 2.9 01/08/2014   CHOLHDL 3.5 11/13/2013   No results found for: [facility]    Epic records reviewed and patient discussed with Dr Irish Lack  ASSESSMENT AND PLAN:   1. Second degree AV block The patient has symptomatic 2:1 AV block.  He has a h/o conduction system disease with LBBB and prior syncope.  No reversible causes are found.  He presently does not have symptoms of ischemia and CMs have been negative.  I have reviewed the echo findings with Dr Debara Pickett by phone who does not feel that there are wall motion changes or reduction in EF to suggest ischemia.  I would therefore recommend pacemaker implantation at this time.  Risks, benefits, alternatives to pacemaker implantation were discussed in detail with the patient today. The patient understands that the risks include but are not limited to bleeding, infection, pneumothorax, perforation, tamponade, vascular damage, renal failure, MI, stroke, death,  and lead dislodgement and wishes to proceed. We will therefore schedule the procedure at the next available time.  I will see if we can arrange cath staffing to proceed with the procedure tomorrow.  I worry that this patient is at risk of decompensation/ clinical deterioration with his persistent AV block.  2. OSA/ severe LAE Compliance with CPAP is encouraged He is planning to see Dr Erik Obey soon to initiate treatment  3. SOB Possibly due to AV block If decreased exercise tolerance does not improve with pacing, will follow-up with outpatient myoview.  4. HTN Stable No change required today  5. MR/TR These are new findings Would repeat echo 2-3 months after initiation of pacing and with further medical therapy for HTN Would favor aggressive ACE inhibitor/  ARB therapy to hopefully help with atrial remodeling.     [name], MD 06/30/2014  8:30 PM

## 2014-07-01 NOTE — Interval H&P Note (Signed)
History and Physical Interval Note:  07/01/2014 9:28 AM  Alexander Curtis  has presented today for surgery, with the diagnosis of AV BLOCK  The various methods of treatment have been discussed with the patient and family. After consideration of risks, benefits and other options for treatment, the patient has consented to  Procedure(s): PERMANENT PACEMAKER INSERTION (N/A) as a surgical intervention .  The patient's history has been reviewed, patient examined, no change in status, stable for surgery.  I have reviewed the patient's chart and labs.  Questions were answered to the patient's satisfaction.     [name]

## 2014-07-01 NOTE — Op Note (Signed)
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  Pacemaker implantation.     INTRODUCTION: Alexander Curtis is a 68 y.o. 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  The patient reports intermittent episodes of fatigue and decreased exercise tolerance over the past few days.  No reversible causes have been identified.  The patient therefore presents today for pacemaker implantation.     DESCRIPTION OF PROCEDURE:  Informed written consent was obtained, and the patient was brought to the electrophysiology lab in a fasting state.  The patient required no sedation for the procedure today.  The patients left chest was prepped and draped in the usual sterile fashion by the EP lab staff. The skin overlying the left deltopectoral region was infiltrated with lidocaine for local analgesia.  A 4-cm incision was made over the left deltopectoral region.  A left subcutaneous pacemaker pocket was fashioned using a combination of sharp and blunt dissection. Electrocautery was required to assure hemostasis.    RA/RV Lead Placement: The left axillary vein was cannulated.  No contrast was required for this endeavor.  Through the left axillary vein, a Medtronic model [id number] (serial number [id number]) right atrial lead and a Medtronic model 5076- 58 (serial number [id number] [id number]) right ventricular lead were advanced   with fluoroscopic visualization into the right atrial appendage and right ventricular apical septal positions respectively.  Initial atrial lead P- waves measured 2.8 mV with impedance of 994 ohms and a threshold of 1.4 V at 0.5 msec.  Right ventricular lead R-waves measured 6 mV with an impedance of 767 ohms and a threshold of 1.1 V at 0.5 msec.  Both leads were secured to the pectoralis fascia  using #2-0 silk over the suture sleeves.   Device Placement:  The leads were then connected to a Medtronic Adapta L model ADDRL 1 (serial number [id number]) pacemaker.  The pocket was irrigated with copious gentamicin solution.  The pacemaker was then placed into the pocket.  The pocket was then closed in 2 layers with 2.0 Vicryl suture for the subcutaneous and subcuticular layers.  Steri- Strips and a sterile dressing were then applied. EBL<81ml. There were no early apparent complications.     CONCLUSIONS:   1. 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 No early apparent complications.           [name], MD 07/01/2014 10:28 AM

## 2014-07-02 ENCOUNTER — Institutional Professional Consult (permissible substitution): Payer: Medicare HMO | Admitting: Internal Medicine

## 2014-07-02 ENCOUNTER — Encounter (HOSPITAL_COMMUNITY): Payer: Self-pay | Admitting: Physician Assistant

## 2014-07-02 ENCOUNTER — Inpatient Hospital Stay (HOSPITAL_COMMUNITY): Payer: Medicare HMO

## 2014-07-02 DIAGNOSIS — E785 Hyperlipidemia, unspecified: Secondary | ICD-10-CM | POA: Diagnosis present

## 2014-07-02 DIAGNOSIS — I340 Nonrheumatic mitral (valve) insufficiency: Secondary | ICD-10-CM | POA: Diagnosis present

## 2014-07-02 DIAGNOSIS — I447 Left bundle-branch block, unspecified: Secondary | ICD-10-CM | POA: Diagnosis present

## 2014-07-02 DIAGNOSIS — Z950 Presence of cardiac pacemaker: Secondary | ICD-10-CM | POA: Diagnosis present

## 2014-07-02 DIAGNOSIS — I441 Atrioventricular block, second degree: Secondary | ICD-10-CM | POA: Diagnosis present

## 2014-07-02 DIAGNOSIS — G4733 Obstructive sleep apnea (adult) (pediatric): Secondary | ICD-10-CM | POA: Diagnosis present

## 2014-07-02 DIAGNOSIS — I071 Rheumatic tricuspid insufficiency: Secondary | ICD-10-CM | POA: Diagnosis present

## 2014-07-02 DIAGNOSIS — R55 Syncope and collapse: Secondary | ICD-10-CM | POA: Diagnosis present

## 2014-07-02 LAB — BASIC METABOLIC PANEL
Anion gap: 12 (ref 5–15)
BUN: 18 mg/dL (ref 6–23)
CO2: 26 mEq/L (ref 19–32)
Calcium: 9 mg/dL (ref 8.4–10.5)
Chloride: 101 mEq/L (ref 96–112)
Creatinine, Ser: 1 mg/dL (ref 0.50–1.35)
GFR calc Af Amer: 87 mL/min — ABNORMAL LOW (ref >90)
GFR calc non Af Amer: 75 mL/min — ABNORMAL LOW (ref >90)
Glucose, Bld: 106 mg/dL — ABNORMAL HIGH (ref 70–99)
Potassium: 4.2 mEq/L (ref 3.7–5.3)
Sodium: 139 mEq/L (ref 137–147)

## 2014-07-02 LAB — GLUCOSE, CAPILLARY: Glucose-Capillary: 77 mg/dL (ref 70–99)

## 2014-07-02 LAB — B. BURGDORFI ANTIBODIES: B burgdorferi Ab IgG+IgM: 0.33 {ISR}

## 2014-07-02 MED ORDER — LISINOPRIL 10 MG PO TABS: 10.0000 mg | ORAL_TABLET | Freq: Every day | ORAL | Status: DC

## 2014-07-02 NOTE — Care Management Note (Signed)
    Page 1 of 1   07/02/2014     11:13:14 AM CARE MANAGEMENT NOTE 07/02/2014  Patient:  Alexander Curtis, Alexander Curtis   Account Number:  [id number]  Date Initiated:  07/02/2014  Documentation initiated by:  GRAVES-BIGELOW,Tommy Goostree  Subjective/Objective Assessment:   Pt admitted for intermittent LBBB, prior syncope, who presents with DOE, fatigue and high grade AV block. Pt d/c home.     Action/Plan:   No needs identified by CM at this time.   Anticipated DC Date:  07/02/2014   Anticipated DC Plan:  [place]  CM consult      Choice offered to / List presented to:             Status of service:  Completed, signed off Medicare Important Message given?  YES (If response is "NO", the following Medicare IM given date fields will be blank) Date Medicare IM given:  07/02/2014 Medicare IM given by:  GRAVES-BIGELOW,Deziyah Arvin Date Additional Medicare IM given:   Additional Medicare IM given by:    Discharge Disposition:  HOME/SELF CARE  Per UR Regulation:  Reviewed for med. necessity/level of care/duration of stay  If discussed at [place] of Stay Meetings, dates discussed:    Comments:

## 2014-07-02 NOTE — Discharge Summary (Signed)
Discharge Summary   [name]: Alexander Curtis MRN: [id number], DOB/AGE: 69-Oct-1947 69 y.o. Admit date: 06/29/2014 D/C date:     07/02/2014  Primary Cardiologist: Dr. Rayann Curtis  Principal Problem:   Mobitz type 2 second degree heart block Active Problems:   Pacemaker   Tricuspid regurgitation   Mitral regurgitation   Obstructive sleep apnea   Syncope   HLD (hyperlipidemia)   LBBB (left bundle branch block)   Hypertension    Admission Dates: 06/29/14-07/02/14 Discharge Diagnosis: Symptomatic Mobitz Type II 2nd degree heart block s/p Medtronic Adapta L model ADDRL 1 (serial number [id number]) pacemaker placement on 07/01/14.  HPI: Alexander Curtis is a 68 y.o. male with a history of HLD, DM, intermittent LBBB, and prior syncope who presented to [facility] on 06/29/14 with DOE, fatigue and found to have a high grade AV block.    The patient reported progressive fatigue and decreased exercise tolerance for a week. He was evaluated by Dr Alexander Curtis and found to have 2:1 AV block. Dr Alexander Curtis reviewed the ekg with Dr Alexander Curtis and the patient was referred to EP with plans to see Dr. Rayann Curtis that next week. Unfortunately, he continues to have worsening bradycardia with further fatigue. He was therefore brought to [facility] ED where he was confirmed to have ongoing second degree AV block. He was  admitted for further evaluation. He is not on any nodal agents. No history of tick bites. No family history of conduction disease or pacemakers. Of note, in 2011, Alexander Curtis was worked up for a syncopal episode at [facility]. He underwent a TTE (normal EF) and lexiscan (normal). He was noted to have intermittent LBBB during the stress test. Etiology was most likely vasovagal per wife.    Hospital Course  Mobitz Type II second degree AV block -- TSH normal, B. Curtis antibodies in progress -- The patient has symptomatic 2:1 AV block. He has a h/o conduction system disease with LBBB and prior syncope. No  reversible causes were found. He presently does not have symptoms of ischemia and CEs negative.2D ECHO revealed no WMAs or reduction in EF to suggest ischemia.Therefore, pacemaker implantation was recommended.   -- S/p successful placement of PPM on 07/01/14 -- Pacemaker interrogation is reviewed this am and normal CXR reveals stable leads and no ptx.  OSA/ severe LAE -- Compliance with CPAP encouraged -- He is planning to see Dr Erik Obey soon to initiate treatment  SOB -- Possibly due to AV block -- If decreased exercise tolerance does not improve with pacing, will follow-up with outpatient myoview.  HTN -- Lisinopril increased to 10mg  daily -- Follow-up with PCP   MR/TR- these are new findings -- Would repeat echo 2-3 months after initiation of pacing and with further medical therapy for HTN -- ACEi increased from lisinopril 5mg  to 10mg  which will hopefully help with atrial remodeling.   DM- continue metformin   The patient has had an uncomplicated hospital course and is recovering well. He has been seen by Dr. Rayann Curtis today and deemed ready for discharge home. All follow-up appointments have been scheduled. Discharge medications are listed below.   Discharge Vitals: Blood pressure 161/73, pulse 61, temperature 98.3 F (36.8 C), temperature source Oral, resp. rate 18, height 6\' 1"  (1.854 m), weight 196 lb 9.6 oz (89.177 kg), SpO2 100 %.  Labs: Lab Results  Component Value Date   WBC 4.3 06/30/2014   HGB 12.2* 06/30/2014   HCT 37.1* 06/30/2014   MCV 90.0  06/30/2014   PLT 140* 06/30/2014    Recent Labs Lab 06/28/14 1436  07/02/14 0400  NA 139  < > 139  K 4.1  < > 4.2  CL 105  < > 101  CO2 28  < > 26  BUN 17  < > 18  CREATININE 0.96  < > 1.00  CALCIUM 9.3  < > 9.0  PROT 6.6  --   --   BILITOT 0.5  --   --   ALKPHOS 53  --   --   ALT 25  --   --   AST 21  --   --   GLUCOSE 92  < > 106*  < > = values in this interval not displayed.  Recent Labs  06/30/14 1204   TROPONINI <0.30   Lab Results  Component Value Date   CHOL 174 04/09/2014   HDL 48 04/09/2014   LDLCALC 116* 04/09/2014   TRIG 48 04/09/2014     Diagnostic Studies/Procedures   Dg Chest 2 View  07/02/2014   CLINICAL DATA:  Pacemaker.  EXAM: CHEST  2 VIEW  COMPARISON:  06/26/2014.  FINDINGS: Mediastinum hilar structures normal. Cardiac pacer and lead tips in good anatomic position. Lungs are clear. No pleural effusion or pneumothorax. Heart size and pulmonary vascularity normal. No acute osseus abnormality. Degenerative changes both shoulders.  IMPRESSION: 1. Interval placement of pacemaker, pacemaker leads are noted with tips in the right atrium and right ventricle. Heart size normal. 2. No acute pulmonary disease.   Electronically Signed   By: Marcello Moores  Register   On: 07/02/2014 07:23   Dg Chest 2 View  06/26/2014   CLINICAL DATA:  Elevated blood pressure.  Shortness of breath.  EXAM: CHEST  2 VIEW  COMPARISON:  11/23/2009.  FINDINGS: Mediastinum and hilar structures are normal the lungs are clear. Heart size normal. No pleural effusion or pneumothorax. Degenerative changes thoracic spine.  IMPRESSION: No acute cardiopulmonary disease.   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 Pacemaker implantation.    INTRODUCTION: IVERSON SEES is a 68 y.o. 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 The patient reports intermittent episodes of fatigue and decreased exercise tolerance over the past few days. No reversible causes have been identified. The patient therefore presents today for pacemaker implantation.    DESCRIPTION OF PROCEDURE: Informed written consent was obtained, and the  patient was brought to the electrophysiology lab in a fasting state. The patient required no sedation for the procedure today. The patients left chest was prepped and draped in the usual sterile fashion by the EP lab staff. The skin overlying the left deltopectoral region was infiltrated with lidocaine for local analgesia. A 4-cm incision was made over the left deltopectoral region. A left subcutaneous pacemaker pocket was fashioned using a combination of sharp and blunt dissection. Electrocautery was required to assure hemostasis.   RA/RV Lead Placement: The left axillary vein was cannulated. No contrast was required for this endeavor. Through the left axillary vein, a Medtronic model [id number] (serial number [id number]) right atrial lead and a Medtronic model 5076- 58 (serial number [id number] [id number]) right ventricular lead were advanced  with fluoroscopic visualization into the right  atrial appendage and right ventricular apical septal positions respectively. Initial atrial lead P- waves measured 2.8 mV with impedance of 994 ohms and a threshold of 1.4 V at 0.5 msec. Right ventricular lead R-waves measured 6 mV with an impedance of 767 ohms and a threshold of 1.1 V at 0.5 msec. Both leads were secured to the pectoralis fascia using #2-0 silk over the suture sleeves.   Device Placement: The leads were then connected to a Medtronic Adapta L model ADDRL 1 (serial number [id number]) pacemaker. The pocket was irrigated with copious gentamicin solution. The pacemaker was then placed into the pocket. The pocket was then closed in 2 layers with 2.0 Vicryl suture for the subcutaneous and subcuticular layers. Steri- Strips and a sterile dressing were then applied. EBL<50ml. There were no early apparent complications.    CONCLUSIONS:  1. 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 No early apparent complications.          Discharge Medications     Medication List    TAKE these medications        ascorbic acid 100 MG tablet  Commonly known as:  VITAMIN C  Take 500 mg by mouth daily.     atorvastatin 10 MG tablet  Commonly known as:  LIPITOR  Take 1 tablet (10 mg total) by mouth daily.     CINNAMON PO  Take 1 capsule by mouth 3 (three) times daily.     CoQ10 200 MG Caps  Take 2 capsules by mouth daily.     eszopiclone 2 MG Tabs tablet  Commonly known as:  LUNESTA  Take 1 tablet (2 mg total) by mouth at bedtime as needed for sleep. Take immediately before bedtime     fish oil-omega-3 fatty acids 1000 MG capsule  Take 3 g by mouth daily.     folic acid 443 MCG tablet  Commonly known as:  FOLVITE  Take 400 mcg by mouth daily.     lisinopril 10 MG tablet  Commonly known as:  PRINIVIL,ZESTRIL  Take 1 tablet (10 mg total) by mouth daily.     MENS 50+ MULTI VITAMIN/MIN PO  Take 1 tablet by mouth daily.     metFORMIN 500 MG 24 hr tablet  Commonly known as:  GLUCOPHAGE-XR  Take 1 tablet (500 mg total) by mouth daily with breakfast.        Disposition   The patient will be discharged in stable condition to home.  Follow-up Information    Follow up with [facility] On 07/12/2014.   Why:  @ 9am in [address] for a wound check    Contact information:   [place] [address] [phone]      Follow up with [name], MD.   Specialty:  Cardiology   Why:  The office will call you to make an appointment with Dr Alexander Curtis in March.  , If you do not hear from them in the next month, please contact them.   Contact information:   [address] [place] [address] [phone]       Follow up with KNAPP,EVE A, MD.   Specialty:  Family Medicine   Why:  Please make an appointment with your PCP in the next couple weeks    Contact information:   [address] [place] [address] [phone]  Duration of Discharge Encounter: Greater than 30 minutes including physician and PA time.  Mable Fill R PA-C 07/02/2014, 8:44 AM   [name] MD 07/02/2014 6:36 PM

## 2014-07-02 NOTE — Progress Notes (Signed)
Doing well this am. No CV concerns  BP is stable.  I have increased lisinopril to 10mg  daily.  He should continue this dose on discharge and follow-up with PCP for blood pressure management.  Pacemaker interrogation is reviewed this am and normal CXR reveals stable leads and no ptx.  Will discharge to home today  Routine wound care and follow-up.

## 2014-07-02 NOTE — Discharge Instructions (Addendum)
Bradycardia Bradycardia is a term for a heart rate (pulse) that, in adults, is slower than 60 beats per minute. A normal rate is 60 to 100 beats per minute. A heart rate below 60 beats per minute may be normal for some adults with healthy hearts. If the rate is too slow, the heart may have trouble pumping the volume of blood the body needs. If the heart rate gets too low, blood flow to the brain may be decreased and may make you feel lightheaded, dizzy, or faint. The heart has a natural pacemaker in the top of the heart called the SA node (sinoatrial or sinus node). This pacemaker sends out regular electrical signals to the muscle of the heart, telling the heart muscle when to beat (contract). The electrical signal travels from the upper parts of the heart (atria) through the AV node (atrioventricular node), to the lower chambers of the heart (ventricles). The ventricles squeeze, pumping the blood from your heart to your lungs and to the rest of your body. CAUSES   Problem with the heart's electrical system.  Problem with the heart's natural pacemaker.  Heart disease, damage, or infection.  Medications.  Problems with minerals and salts (electrolytes). SYMPTOMS   Fainting (syncope).  Fatigue and weakness.  Shortness of breath (dyspnea).  Chest pain (angina).  Drowsiness.  Confusion. DIAGNOSIS   An electrocardiogram (ECG) can help your caregiver determine the type of slow heart rate you have.  If the cause is not seen on an ECG, you may need to wear a heart monitor that records your heart rhythm for several hours or days.  Blood tests. TREATMENT   Electrolyte supplements.  Medications.  Withholding medication which is causing a slow heart rate.  Pacemaker placement. SEEK IMMEDIATE MEDICAL CARE IF:   You feel lightheaded or faint.  You develop an irregular heart rate.  You feel chest pain or have trouble breathing. MAKE SURE YOU:   Understand these  instructions.  Will watch your condition.  Will get help right away if you are not doing well or get worse. Document Released: 03/21/2002 Document Revised: 09/21/2011 Document Reviewed: 10/04/2013 [facility] Patient Information 2015 [place], [place]. This information is not intended to replace advice given to you by your health care provider. Make sure you discuss any questions you have with your health care provider. Biventricular Pacemaker Implantation, Care After Refer to this sheet in the next few weeks. These instructions provide you with information on caring for yourself after your procedure. Your health care provider may also give you more specific instructions. Your treatment has been planned according to current medical practices, but problems sometimes occur. Call your health care provider if you have any problems or questions after your procedure. WHAT TO EXPECT AFTER THE PROCEDURE  You may feel pain. Some pain is normal. It may last a few days.  A slight bump may be seen over the skin where the device was placed. Sometimes, it is possible to feel the device under the skin. This is normal.  In the months and years afterward, your health care provider will check the device, the leads, and the battery every few months. Eventually, when the battery is low, the device will be replaced. HOME CARE INSTRUCTIONS Medicines  Take medicines only as directed by your health care provider.  If you were prescribed an antibiotic medicine, finish it all even if you start to feel better.   Do not take any other medicines without asking your health care provider first. Some  medicines, including certain painkillers, can cause bleeding in your stomach after surgery. Wound Care  Do not remove the bandage on your chest until directed to do so by your health care provider.  After your bandage is removed, you may see pieces of tape called skin adhesive strips over the area where the cut was made  (incision site). Let them fall off on their own.   Check the incision site every day to make sure it is not infected, bleeding, or starting to pull apart.  Do not use lotions or ointments near the incision site unless directed to do so.   Keep the incision area clean and dry for 2-3 days after the procedure or as directed by your health care provider. It takes several weeks for the incision site to completely heal.   Do not take baths, swim, or use a hot tub until your health care provider approves. Activities  Try to walk a little every day. Exercising is important after this procedure. It is also important to use your shoulder on the side of the pacemaker in daily tasks that do not require exaggerated motion.  Avoid sudden jerking, pulling, or chopping movements that pull your upper arm far away from your body for at least 6 weeks.   Do not lift your upper arm above your shoulders for at least 6 weeks. This means no tennis, golf, or swimming for this period of time. If you sleep with the arm above your head, use a restraint to prevent this from happening as you sleep.  You may go back to work when your health care provider says it is okay. Check with your health care provider before you start to drive or play sports. Other Instructions  Follow diet instructions if they were provided. You should be able to eat what you usually do right away, but you may need to limit your salt intake.   Weigh yourself every day. If you suddenly gain weight, fluid may be building up in your body.   Always carry your pacemaker identification card with you. The card should list the implant date, device model, and manufacturer. Consider wearing a medical alert bracelet or necklace.   Tell all health care providers that you have a pacemaker. This may prevent them from giving you a magnetic resource imaging scan (MRI) because of the strong magnets used during that test.   If you must pass through a  metal detector, quickly walk through it. Do not stop under the detector or stand near it.   Avoid places or objects with a strong electric or magnetic field, including:   Engineer, maintenance. When at the airport, let officials know you have a pacemaker. Your ID card will let you be checked in a way that is safe for you and that will not damage your pacemaker. Also, do not let a security person wave a magnetic wand near your pacemaker. That can make it stop working.   Power plants.   Large electrical generators.   Radiofrequency transmission towers, such as cell phone and radio towers.   Do not use amateur (ham) radio equipment or electric (arc) welding torches. Some devices are safe to use if held at least 1 foot from your pacemaker. These include power tools, lawn mowers, and speakers. If you are unsure of whether something is safe to use, ask your health care provider.   You may safely use electric blankets, heating pads, computers, and microwave ovens.   When using your cell  phone, hold it to the ear opposite the pacemaker. Do not leave your cell phone in a pocket over the pacemaker.   Keep all follow-up visits as directed by your health care provider. This is how your health care provider makes sure your chest is healing the way it should. Ask your health care provider when you should come back to have your stitches or staples taken out.   Have your pacemaker checked every 3-6 months or as directed by your health care provider. Most pacemakers last for 4-8 years before a new one is needed.  SEEK MEDICAL CARE IF:  You gain weight suddenly.   Your legs or feet swell more than they have before.   It feels like your heart is fluttering or skipping beats (heart palpitations).  You have a fever. SEEK IMMEDIATE MEDICAL CARE IF:   You have chest pain.   You feel more short of breath than you have felt before.   You feel more light-headed than you have felt before.    You have problems with your incision site, such as swelling or bleeding, or it starts to open up.   You notice signs of infection around your incision site. Watch for:   Warmth.   Redness.   Worsening pain.   Swelling.   Fluid leaking from the incision site.  Document Released: 03/23/2012 Document Revised: 11/13/2013 Document Reviewed: 03/23/2012 [facility] Patient Information 2015 [place], [place]. This information is not intended to replace advice given to you by your health care provider. Make sure you discuss any questions you have with your health care provider.     Supplemental Discharge Instructions for  Pacemaker/Defibrillator Patients  Activity No heavy lifting or vigorous activity with your left/right arm for 6 to 8 weeks.  Do not raise your left/right arm above your head for one week.  Gradually raise your affected arm as drawn below.           __12/23/2015             12/24                       12/25                     12/26  NO DRIVING for  Until cleared by your EP doctor   WOUND CARE - Keep the wound area clean and dry.  Do not get this area wet for one week. No showers for one week; you may shower on  07/09/2014   . - The tape/steri-strips on your wound will fall off; do not pull them off.  No bandage is needed on the site.  DO  NOT apply any creams, oils, or ointments to the wound area. - If you notice any drainage or discharge from the wound, any swelling or bruising at the site, or you develop a fever > 101? F after you are discharged home, call the office at once.  Special Instructions - You are still able to use cellular telephones; use the ear opposite the side where you have your pacemaker/defibrillator.  Avoid carrying your cellular phone near your device. - When traveling through airports, show security personnel your identification card to avoid being screened in the metal detectors.  Ask the security personnel to use the hand wand. - Avoid  arc welding equipment, MRI testing (magnetic resonance imaging), TENS units (transcutaneous nerve stimulators).  Call the office for questions about other devices. - Avoid electrical  appliances that are in poor condition or are not properly grounded. - Microwave ovens are safe to be near or to operate.  Additional information for defibrillator patients should your device go off: - If your device goes off ONCE and you feel fine afterward, notify the device clinic nurses. - If your device goes off ONCE and you do not feel well afterward, call 911. - If your device goes off TWICE, call 911. - If your device goes off THREE times in one day, call 911.  DO NOT DRIVE YOURSELF OR A FAMILY MEMBER WITH A DEFIBRILLATOR TO THE HOSPITAL--CALL 911.

## 2014-07-12 ENCOUNTER — Ambulatory Visit (INDEPENDENT_AMBULATORY_CARE_PROVIDER_SITE_OTHER): Payer: Medicare HMO | Admitting: *Deleted

## 2014-07-12 DIAGNOSIS — I441 Atrioventricular block, second degree: Secondary | ICD-10-CM

## 2014-07-12 DIAGNOSIS — Z950 Presence of cardiac pacemaker: Secondary | ICD-10-CM

## 2014-07-12 LAB — MDC_IDC_ENUM_SESS_TYPE_INCLINIC
Battery Impedance: 100 Ohm
Battery Remaining Longevity: 137 mo
Battery Voltage: 2.79 V
Brady Statistic AP VP Percent: 8 %
Brady Statistic AP VS Percent: 1 %
Brady Statistic AS VP Percent: 43 %
Brady Statistic AS VS Percent: 49 %
Date Time Interrogation Session: 20151231094814
Lead Channel Impedance Value: 514 Ohm
Lead Channel Impedance Value: 599 Ohm
Lead Channel Pacing Threshold Amplitude: 0.5 V
Lead Channel Pacing Threshold Amplitude: 0.5 V
Lead Channel Pacing Threshold Pulse Width: 0.4 ms
Lead Channel Pacing Threshold Pulse Width: 0.4 ms
Lead Channel Sensing Intrinsic Amplitude: 11.2 mV
Lead Channel Sensing Intrinsic Amplitude: 4 mV
Lead Channel Setting Pacing Amplitude: 3.5 V
Lead Channel Setting Pacing Amplitude: 3.5 V
Lead Channel Setting Pacing Pulse Width: 0.4 ms
Lead Channel Setting Sensing Sensitivity: 4 mV

## 2014-07-12 NOTE — Progress Notes (Signed)
Wound check appointment. Steri-strips removed. Wound without redness or edema. Incision edges approximated, wound well healed. Normal device function. Thresholds, sensing, and impedances consistent with implant measurements. Device programmed at 3.5V/auto capture programmed on for extra safety margin until 3 month visit. Histogram distribution appropriate for patient and level of activity. No mode switches or high ventricular rates noted. Patient educated about wound care, arm mobility, lifting restrictions. ROV w/ Dr. Rayann Heman in 43mo.

## 2014-07-16 ENCOUNTER — Other Ambulatory Visit: Payer: Medicare HMO

## 2014-07-16 DIAGNOSIS — Z125 Encounter for screening for malignant neoplasm of prostate: Secondary | ICD-10-CM

## 2014-07-16 DIAGNOSIS — E119 Type 2 diabetes mellitus without complications: Secondary | ICD-10-CM

## 2014-07-16 DIAGNOSIS — E7800 Pure hypercholesterolemia, unspecified: Secondary | ICD-10-CM

## 2014-07-16 LAB — HEPATIC FUNCTION PANEL
ALT: 23 U/L (ref 0–53)
AST: 30 U/L (ref 0–37)
Albumin: 4.3 g/dL (ref 3.5–5.2)
Alkaline Phosphatase: 57 U/L (ref 39–117)
Bilirubin, Direct: 0.2 mg/dL (ref 0.0–0.3)
Indirect Bilirubin: 0.5 mg/dL (ref 0.2–1.2)
Total Bilirubin: 0.7 mg/dL (ref 0.2–1.2)
Total Protein: 6.4 g/dL (ref 6.0–8.3)

## 2014-07-16 LAB — LIPID PANEL
Cholesterol: 135 mg/dL (ref 0–200)
HDL: 55 mg/dL (ref >39)
LDL Cholesterol: 69 mg/dL (ref 0–99)
Total CHOL/HDL Ratio: 2.5 Ratio
Triglycerides: 54 mg/dL (ref <150)
VLDL: 11 mg/dL (ref 0–40)

## 2014-07-16 LAB — GLUCOSE, RANDOM: Glucose, Bld: 104 mg/dL — ABNORMAL HIGH (ref 70–99)

## 2014-07-17 LAB — HEMOGLOBIN A1C
Hgb A1c MFr Bld: 6.3 % — ABNORMAL HIGH (ref <5.7)
Mean Plasma Glucose: 134 mg/dL — ABNORMAL HIGH (ref <117)

## 2014-07-17 LAB — PSA: PSA: 0.52 ng/mL (ref <=4.00)

## 2014-07-19 ENCOUNTER — Ambulatory Visit (INDEPENDENT_AMBULATORY_CARE_PROVIDER_SITE_OTHER): Payer: Medicare HMO | Admitting: Family Medicine

## 2014-07-19 ENCOUNTER — Encounter: Payer: Self-pay | Admitting: Family Medicine

## 2014-07-19 VITALS — BP 134/74 | HR 72 | Ht 72.5 in | Wt 195.2 lb

## 2014-07-19 DIAGNOSIS — G4733 Obstructive sleep apnea (adult) (pediatric): Secondary | ICD-10-CM

## 2014-07-19 DIAGNOSIS — I10 Essential (primary) hypertension: Secondary | ICD-10-CM

## 2014-07-19 DIAGNOSIS — E119 Type 2 diabetes mellitus without complications: Secondary | ICD-10-CM

## 2014-07-19 DIAGNOSIS — E7800 Pure hypercholesterolemia, unspecified: Secondary | ICD-10-CM

## 2014-07-19 DIAGNOSIS — Z950 Presence of cardiac pacemaker: Secondary | ICD-10-CM

## 2014-07-19 MED ORDER — METFORMIN HCL ER 500 MG PO TB24: 500.0000 mg | ORAL_TABLET | Freq: Every day | ORAL | Status: DC

## 2014-07-19 MED ORDER — ATORVASTATIN CALCIUM 10 MG PO TABS: 10.0000 mg | ORAL_TABLET | Freq: Every day | ORAL | Status: DC

## 2014-07-19 NOTE — Progress Notes (Signed)
Chief Complaint  Patient presents with  . Hypertension    nonfasting med check.    Hyperlipidemia:  Patient has been taking lipitor regularly for the last 3 months (after LDL was 116).  Denies any side effects.  He is trying to follow a lowfat, low cholesterol diet.  He cut back on eggs and bacon, switched to egg whites. Still has some cheese.  Diabetes--he was started on metformin after his visit in October.  Tolerating it without side effects.  Sugars have been running under 100 if good about avoiding bread and sweets--highest he has seen since being on Metformin was 109.  His exercise has been limited related to the bradycardia and pacemaker.  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  He had f/u visit 12/31 and pacemaker was working fine.  He no longer  Having any shortness of breath, energy is much better. Currently limited to lifting <10 pounds with left arm.  He hasn't resumed walking yet.  Hypertension:  Lisinopril dose was increased back up to 10mg  while in hospital.  BP's at home have been 150's/70-80.  He took his med within 2 hours.   245/80 at Dr. Noreene Filbert office  Sleep apnea--saw Dr. Erik Obey.  Unable to get prior sleep study report (no longer kept records, too long ago).  If able to get records, was planning on having CPAP autotitration done.  Without records, he may need to have sleep study repeated.  As recommended, he tried Afrin at night--still thinks he breathes out of his mouth as habit.  Holding off on any surgery until last resort.  PMH, PSH, SH reviewed and updated.  Outpatient Encounter Prescriptions as of 07/19/2014  Medication Sig Note  . ascorbic acid (VITAMIN C) 100 MG tablet Take 500 mg by mouth daily.   Marland Kitchen atorvastatin (LIPITOR) 10 MG tablet Take 1 tablet (10 mg total) by mouth daily.   Marland Kitchen CINNAMON PO Take 1 capsule by mouth 3 (three) times daily. 06/29/2014: ....  . Coenzyme Q10 (COQ10) 200 MG CAPS Take 2 capsules by  mouth daily.   . fish oil-omega-3 fatty acids 1000 MG capsule Take 3 g by mouth daily.    . folic acid (FOLVITE) 998 MCG tablet Take 400 mcg by mouth daily.   Marland Kitchen lisinopril (PRINIVIL,ZESTRIL) 10 MG tablet Take 1 tablet (10 mg total) by mouth daily.   . metFORMIN (GLUCOPHAGE-XR) 500 MG 24 hr tablet Take 1 tablet (500 mg total) by mouth daily with breakfast.   . Multiple Vitamins-Minerals (MENS 50+ MULTI VITAMIN/MIN PO) Take 1 tablet by mouth daily.   . [DISCONTINUED] atorvastatin (LIPITOR) 10 MG tablet Take 1 tablet (10 mg total) by mouth daily. 06/13/2014: .  Marland Kitchen [DISCONTINUED] metFORMIN (GLUCOPHAGE-XR) 500 MG 24 hr tablet Take 1 tablet (500 mg total) by mouth daily with breakfast.   . eszopiclone (LUNESTA) 2 MG TABS tablet Take 1 tablet (2 mg total) by mouth at bedtime as needed for sleep. Take immediately before bedtime (Patient not taking: Reported on 06/13/2014) 06/13/2014: Just uses on trips/flying   Allergies  Allergen Reactions  . Horse-Derived Products     REACTION: Anaphlactic Reaction.  Can't take tetanus shot  . Pravastatin Other (See Comments)    Joint pain   . Adhesive [Tape] Itching and Rash   ROS:  No fever, chills, headaches, dizziness, syncope, chest pain, shortness of breath.  Has some slight discomfort at left chest wall and with certain arm movements related to recent pacemaker placement.  No  nausea, vomiting, diarrhea, bleeding, bruising, rash, depression, joint pains or other concerns.  PHYSICAL EXAM: BP 152/80 mmHg  Pulse 72  Ht 6' 0.5" (1.842 m)  Wt 195 lb 3.2 oz (88.542 kg)  BMI 26.10 kg/m2 134/74 on repeat by MD, RA Pleasant, talkative male in no distress HEENT: conjunctiva clear, PERRL Neck: no lymphadenopathy, thyromegaly or carotid bruit Heart: regular rate and rhythm Lungs: clear bilaterally Abdomen: soft, nontender, no mass Extremities: no edema, 2+ pulses Neuro: alert and oriented.  Cranial nerves intact, normal strength, gait.   Labs from   1/4: Fasting glucose 104 Lab Results  Component Value Date   HGBA1C 6.3* 07/16/2014   Lab Results  Component Value Date   ALT 23 07/16/2014   AST 30 07/16/2014   ALKPHOS 57 07/16/2014   BILITOT 0.7 07/16/2014   Lab Results  Component Value Date   CHOL 135 07/16/2014   HDL 55 07/16/2014   LDLCALC 69 07/16/2014   TRIG 54 07/16/2014   CHOLHDL 2.5 07/16/2014   ASSESSMENT/PLAN   Pure hypercholesterolemia - at goal.  continue atorvastatin - Plan: atorvastatin (LIPITOR) 10 MG tablet  Type 2 diabetes mellitus without complication - controlled.  Continue Metformin - Plan: metFORMIN (GLUCOPHAGE-XR) 500 MG 24 hr tablet  Essential hypertension, benign - controlled. (some white-coat component; normal on repeat, when more relaxed).  continue current medications  Obstructive sleep apnea - f/u as planned, in order to get it treated.  Agree with recommendation to retry CPAP (but may need new study)  Pacemaker - recently placed, and feeling much better.  F/u in 6 months for med check, sooner prn.

## 2014-07-19 NOTE — Patient Instructions (Addendum)
Blood pressure, lipids and diabetes is all well controlled. Your pulse was 68. Continue all of your current medications. Recheck is due again in 6 months.  Bring any labs with you that you have done through the [place] so that I don't duplicate care.  Please follow up about the sleep apnea (you likely will need to have another sleep study performed) and get it treated.

## 2014-07-20 ENCOUNTER — Encounter: Payer: Self-pay | Admitting: *Deleted

## 2014-07-20 ENCOUNTER — Ambulatory Visit: Payer: Medicare HMO | Admitting: Cardiology

## 2014-08-02 ENCOUNTER — Encounter: Payer: Self-pay | Admitting: Internal Medicine

## 2014-08-15 ENCOUNTER — Ambulatory Visit (HOSPITAL_BASED_OUTPATIENT_CLINIC_OR_DEPARTMENT_OTHER): Payer: Medicare HMO | Attending: Otolaryngology | Admitting: Radiology

## 2014-08-15 VITALS — Ht 72.0 in | Wt 196.0 lb

## 2014-08-15 DIAGNOSIS — G4733 Obstructive sleep apnea (adult) (pediatric): Secondary | ICD-10-CM | POA: Diagnosis present

## 2014-08-25 DIAGNOSIS — G4733 Obstructive sleep apnea (adult) (pediatric): Secondary | ICD-10-CM

## 2014-08-25 NOTE — Sleep Study (Signed)
   NAME: Alexander Curtis DATE OF BIRTH:  06/06/1946 MEDICAL RECORD NUMBER [id number]  LOCATION: [facility]  PHYSICIAN: Paytan Recine D  DATE OF STUDY: 08/15/2014  SLEEP STUDY TYPE: Nocturnal Polysomnogram               REFERRING PHYSICIAN: Jodi Marble, MD  INDICATION FOR STUDY: Hypersomnia with sleep apnea  EPWORTH SLEEPINESS SCORE:   20/24 HEIGHT: 6' (182.9 cm)  WEIGHT: 196 lb (88.905 kg)    Body mass index is 26.58 kg/(m^2).  NECK SIZE: 15.5 in.  MEDICATIONS: Charted for review  SLEEP ARCHITECTURE: Total sleep time 365.5 minutes with sleep efficiency 83.4%. Stage I was 7.3%, stage II 64.2%, stage III absent, REM 28.6% of total sleep time. Sleep latency 15 minutes, REM latency 231.5 minutes, awake after sleep onset 57.5 minutes, arousal index 11.8, bedtime medication: None  RESPIRATORY DATA: Apnea hypopnea index (AHI) 36.6 per hour. 223 total events scored including 112 obstructive apneas, 4 central apneas, 11 mixed apneas, 96 hypopneas. Non-positional events. REM AHI 64.9 per hour. Most events developed after 1 AM, 2 late permit split protocol CPAP titration  OXYGEN DATA: Moderate snoring with oxygen desaturation to a nadir of 69% and mean saturation 94% on room air  CARDIAC DATA: Sinus rhythm with PVCs, pacemaker  MOVEMENT/PARASOMNIA: 267 limb jerks were counted of which only 4 were associated with arousal or awakening from sleep. Bathroom 1  IMPRESSION/ RECOMMENDATION:   1) Severe obstructive sleep apnea/hypopnea syndrome, AHI 36.6 per hour with non-positional events. REM AHI 64.9 per hour. Moderate snoring with oxygen desaturation to a nadir of 69% and mean oxygen saturation 94% on room air 2) Because most events developed after 1 AM, there were not enough early events to meet protocol requirements for split CPAP titration on this study.   Deneise Lever Diplomate, American Board of Sleep Medicine  ELECTRONICALLY SIGNED ON:  08/25/2014, 10:48  AM [place] PH: [phone]   FX: [phone] [place]

## 2014-10-09 ENCOUNTER — Encounter (HOSPITAL_BASED_OUTPATIENT_CLINIC_OR_DEPARTMENT_OTHER): Payer: Medicare HMO

## 2014-10-24 ENCOUNTER — Encounter: Payer: Self-pay | Admitting: Internal Medicine

## 2014-10-24 ENCOUNTER — Ambulatory Visit (INDEPENDENT_AMBULATORY_CARE_PROVIDER_SITE_OTHER): Payer: Medicare HMO | Admitting: Internal Medicine

## 2014-10-24 VITALS — BP 124/86 | HR 85 | Ht 72.0 in | Wt 199.6 lb

## 2014-10-24 DIAGNOSIS — I441 Atrioventricular block, second degree: Secondary | ICD-10-CM | POA: Diagnosis not present

## 2014-10-24 DIAGNOSIS — I340 Nonrheumatic mitral (valve) insufficiency: Secondary | ICD-10-CM

## 2014-10-24 DIAGNOSIS — R001 Bradycardia, unspecified: Secondary | ICD-10-CM | POA: Diagnosis not present

## 2014-10-24 LAB — MDC_IDC_ENUM_SESS_TYPE_INCLINIC
Battery Impedance: 100 Ohm
Battery Remaining Longevity: 141 mo
Battery Voltage: 2.79 V
Brady Statistic AP VP Percent: 8 %
Brady Statistic AP VS Percent: 1 %
Brady Statistic AS VP Percent: 66 %
Brady Statistic AS VS Percent: 26 %
Date Time Interrogation Session: 20160413133851
Lead Channel Impedance Value: 490 Ohm
Lead Channel Impedance Value: 522 Ohm
Lead Channel Pacing Threshold Amplitude: 0.5 V
Lead Channel Pacing Threshold Amplitude: 0.5 V
Lead Channel Pacing Threshold Pulse Width: 0.4 ms
Lead Channel Pacing Threshold Pulse Width: 0.4 ms
Lead Channel Sensing Intrinsic Amplitude: 4 mV
Lead Channel Sensing Intrinsic Amplitude: 8 mV
Lead Channel Setting Pacing Amplitude: 2 V
Lead Channel Setting Pacing Amplitude: 2.5 V
Lead Channel Setting Pacing Pulse Width: 0.4 ms
Lead Channel Setting Sensing Sensitivity: 4 mV

## 2014-10-24 NOTE — Patient Instructions (Addendum)
Medication Instructions: - No changes today.  Labwork: - None  Procedures/Testing: - Your physician has requested that you have an echocardiogram. Echocardiography is a painless test that uses sound waves to create images of your heart. It provides your doctor with information about the size and shape of your heart and how well your heart's chambers and valves are working. This procedure takes approximately one hour. There are no restrictions for this procedure.  Follow-Up: - our physician recommends that you schedule a follow-up appointment in: next available with Dr. Harrington Challenger.   - Remote monitoring is used to monitor your Pacemaker of ICD from home. This monitoring reduces the number of office visits required to check your device to one time per year. It allows [place] to keep an eye on the functioning of your device to ensure it is working properly. You are scheduled for a device check from home on 01/23/15. You may send your transmission at any time that day. If you have a wireless device, the transmission will be sent automatically. After your physician reviews your transmission, you will receive a postcard with your next transmission date.  - Your physician wants you to follow-up in: 1 year with Dr. Rayann Heman. You will receive a reminder letter in the mail two months in advance. If you don't receive a letter, please call our office to schedule the follow-up appointment.  Any Additional Special Instructions Will Be Listed Below (If Applicable). - None

## 2014-10-24 NOTE — Progress Notes (Signed)
Electrophysiology Office Note   Date:  10/24/2014   ID:  Chanel, Mckesson [birth year], MRN [id number]  PCP:  Vikki Ports, MD  Cardiologist:  Dr Harrington Challenger (hasnt seen since 2011) Primary Electrophysiologist: [name], MD    Chief Complaint  Patient presents with  . Appointment    Mobitz II 2nd degree AV block     History of Present Illness: Alexander Curtis is a 69 y.o. male who presents today for electrophysiology evaluation.   He is doing well since his PPM implant.  SOB is resolved.  Denies CP or any ischemic symptoms.  Working with Dr Warren Danes for management of sleep apnea.  Today, he denies symptoms of palpitations, chest pain, shortness of breath, orthopnea, PND, lower extremity edema, claudication, dizziness, presyncope, syncope, bleeding, or neurologic sequela. The patient is tolerating medications without difficulties and is otherwise without complaint today.    Past Medical History  Diagnosis Date  . HLD (hyperlipidemia)   . Syncope 2011    a. 2011 -negative workup except LBBB. thought to be vasovagal  . LBBB (left bundle branch block)   . Hypertension 05/2013  . Diabetes mellitus, type 2   . Obstructive sleep apnea     a. not compliant with CPAP  . Atrial enlargement, left   . Mitral regurgitation   . Tricuspid regurgitation   . Mobitz type 2 second degree heart block     a. s/p Medtronic Adapta L model ADDRL 1 (serial number [id number]) pacemaker. 07/02/14  . Pacemaker     a. Medtronic Adapta L model ADDRL 1 (serial number [id number]) pacemaker.  . Melanoma 1968    R arm   Past Surgical History  Procedure Laterality Date  . Knee arthroscopy  02/2011    L knee for meniscal tear. [name]  . Inguinal hernia repair      bilateral  . Finger tendon repair      R 2nd and 3rd finger  . Uvulectomy      laser treatment for sleep apnea (NOT UP3)  . Vasectomy    . Permanent pacemaker insertion N/A 07/01/2014    Procedure: PERMANENT PACEMAKER INSERTION;   Surgeon: [name], MD;  Location: [facility] CATH LAB;  Service: Cardiovascular;  Laterality: N/A;     Current Outpatient Prescriptions  Medication Sig Dispense Refill  . ascorbic acid (VITAMIN C) 100 MG tablet Take 100 mg by mouth daily.     Marland Kitchen atorvastatin (LIPITOR) 10 MG tablet Take 1 tablet (10 mg total) by mouth daily. 90 tablet 1  . CINNAMON PO Take 1 capsule by mouth 3 (three) times daily.    . Coenzyme Q10 (COQ10) 200 MG CAPS Take 2 capsules by mouth daily.    . fish oil-omega-3 fatty acids 1000 MG capsule Take 3 g by mouth daily.     . folic acid (FOLVITE) 470 MCG tablet Take 400 mcg by mouth daily.    Marland Kitchen lisinopril (PRINIVIL,ZESTRIL) 10 MG tablet Take 1 tablet (10 mg total) by mouth daily. 30 tablet 11  . metFORMIN (GLUCOPHAGE-XR) 500 MG 24 hr tablet Take 1 tablet (500 mg total) by mouth daily with breakfast. 30 tablet 5  . Multiple Vitamins-Minerals (MENS 50+ MULTI VITAMIN/MIN PO) Take 1 tablet by mouth daily.     No current facility-administered medications for this visit.   Facility-Administered Medications Ordered in Other Visits  Medication Dose Route Frequency Provider Last Rate Last Dose  . influenza  inactive virus vaccine (FLUZONE/FLUARIX) injection  0.5 mL  0.5 mL Intramuscular Once Rita Ohara, MD        Allergies:   Horse-derived products; Pravastatin; and Adhesive   Social History:  The patient  reports that he has never smoked. He has never used smokeless tobacco. He reports that he drinks alcohol. He reports that he does not use illicit drugs.   Family History:  The patient's family history includes Cancer in his mother; Dementia in his mother and paternal aunt; Diabetes in his maternal grandmother and sister; Heart disease in his maternal grandfather; Melanoma (age of onset: 42) in his mother; Stroke (age of onset: 67) in his father.    ROS:  Please see the history of present illness.   All other systems are reviewed and negative.    PHYSICAL EXAM: VS:  BP 124/86  mmHg  Pulse 85  Ht 6' (1.829 m)  Wt 199 lb 9.6 oz (90.538 kg)  BMI 27.06 kg/m2 , BMI Body mass index is 27.06 kg/(m^2). GEN: Well nourished, well developed, in no acute distress HEENT: normal Neck: no JVD, carotid bruits, or masses Cardiac: RRR; 2/6 SEM LLSB, 2/6 SEM at the apex Respiratory:  clear to auscultation bilaterally, normal work of breathing GI: soft, nontender, nondistended, + BS MS: no deformity or atrophy Skin: warm and dry, device pocket is well healed Neuro:  Strength and sensation are intact Psych: euthymic mood, full affect  EKG:  EKG is ordered today. The ekg ordered today shows sinus rhythm with LBBB  Device interrogation is reviewed today in detail.  See PaceArt for details.   Recent Labs: 06/28/2014: TSH 1.405 06/29/2014: Magnesium 1.9 06/30/2014: Hemoglobin 12.2*; Platelets 140*; Pro B Natriuretic peptide (BNP) 86.6 07/02/2014: BUN 18; Creatinine 1.00; Potassium 4.2; Sodium 139 07/16/2014: ALT 23    Lipid Panel     Component Value Date/Time   CHOL 135 07/16/2014 0934   TRIG 54 07/16/2014 0934   HDL 55 07/16/2014 0934   CHOLHDL 2.5 07/16/2014 0934   VLDL 11 07/16/2014 0934   LDLCALC 69 07/16/2014 0934     Wt Readings from Last 3 Encounters:  10/24/14 199 lb 9.6 oz (90.538 kg)  08/15/14 196 lb (88.905 kg)  07/19/14 195 lb 3.2 oz (88.542 kg)      Other studies Reviewed: Additional studies/ records that were reviewed today include: Dr Harrington Challenger' note from 2011,  Echo from 12/15   ASSESSMENT AND PLAN:  1. Mobitz II second degree AV block Normal pacemaker function See Pace Art report No changes today  2. OSA/ severe LAE Compliance with CPAP is encouraged  3. SOB resolved  4. HTN Stable No change required today  5. MR/TR Lisinopril added in hospital Repeat echo at this time now that he is on CPAP and paced May need further evaluation.  Will refer back to Dr Harrington Challenger  Current medicines are reviewed at length with the patient today.   The  patient does not have concerns regarding his medicines.  The following changes were made today:  none  Labs/ tests ordered today include:  Orders Placed This Encounter  Procedures  . Implantable device check    Follow-up: carelink, return to see me in 1 year, refer to Dr Harrington Challenger for consultation regarding valvular disease.  SignedThompson Grayer, MD  10/24/2014 1:55 PM     [place] [address] [phone] (office) [phone] (fax)

## 2014-10-29 ENCOUNTER — Ambulatory Visit (HOSPITAL_COMMUNITY): Payer: Medicare HMO | Attending: Cardiovascular Disease | Admitting: Radiology

## 2014-10-29 DIAGNOSIS — I340 Nonrheumatic mitral (valve) insufficiency: Secondary | ICD-10-CM | POA: Insufficient documentation

## 2014-10-29 NOTE — Progress Notes (Signed)
Echocardiogram complete.  Isadora Delorey, RCS 

## 2014-11-12 ENCOUNTER — Encounter: Payer: Medicare HMO | Admitting: Internal Medicine

## 2014-11-12 ENCOUNTER — Telehealth: Payer: Self-pay | Admitting: *Deleted

## 2014-11-12 NOTE — Telephone Encounter (Signed)
EF is mildly depressed but valvular disease is better.    Needs to follow-up with Dr Harrington Challenger for management        Please inform of results and make sure he has an appointment with Dr Harrington Challenger.                ----- Message -----     From: [name]     Sent: 11/06/2014 11:14 AM      To: [name], MD     Patient has an appointment with Dr Harrington Challenger in June.  I have left him a message with his results and asked  He call withany questions

## 2014-12-17 ENCOUNTER — Ambulatory Visit (INDEPENDENT_AMBULATORY_CARE_PROVIDER_SITE_OTHER): Payer: Medicare HMO | Admitting: Internal Medicine

## 2014-12-17 ENCOUNTER — Encounter: Payer: Self-pay | Admitting: Internal Medicine

## 2014-12-17 VITALS — BP 110/72 | HR 78 | Ht 72.0 in | Wt 200.4 lb

## 2014-12-17 DIAGNOSIS — R001 Bradycardia, unspecified: Secondary | ICD-10-CM

## 2014-12-17 DIAGNOSIS — I10 Essential (primary) hypertension: Secondary | ICD-10-CM | POA: Diagnosis not present

## 2014-12-17 DIAGNOSIS — E785 Hyperlipidemia, unspecified: Secondary | ICD-10-CM | POA: Diagnosis not present

## 2014-12-17 NOTE — Progress Notes (Signed)
Cardiology Office Note   Date:  12/17/2014   ID:  Nadia, Torr 10-11-1945, MRN [id number]  PCP:  Vikki Ports, MD  Cardiologist:   Dorris Carnes, MD   Chief Complaint  Patient presents with  . Follow-up    bradycardia      History of Present Illness: CONAN MCMANAWAY is a 69 y.o. male with a history of PPM   Hx of LBBB  Normal myoview in 2011  Sleep apnea  Uses CPAP  Also a history of HTN  MR and TR  Patinet underwent PPM for high degree AV block in December 2015  Echo showed normal LVE.   Echo on April 18 LVEF was 35 to 40%  Severe anteroseptal hypokinesis     Denies Cp  No SOB  No dizziness or syncpe  No edema Mows the lawn without a problem   Has been more fatigued  Not as much energy as initially after PPM He questions if this is due to his sleep apnea.     Current Outpatient Prescriptions  Medication Sig Dispense Refill  . aspirin 81 MG tablet Take 81 mg by mouth every other day.    Marland Kitchen atorvastatin (LIPITOR) 10 MG tablet Take 1 tablet (10 mg total) by mouth daily. 90 tablet 1  . CINNAMON PO Take 1 capsule by mouth 3 (three) times daily.    . Coenzyme Q10 (COQ10) 200 MG CAPS Take 2 capsules by mouth daily.    . Cyanocobalamin (VITAMIN B-12 PO) Take 1 capsule by mouth daily.    . fish oil-omega-3 fatty acids 1000 MG capsule Take 3 g by mouth daily.     . folic acid (FOLVITE) 106 MCG tablet Take 400 mcg by mouth daily.    Marland Kitchen lisinopril (PRINIVIL,ZESTRIL) 10 MG tablet Take 1 tablet (10 mg total) by mouth daily. 30 tablet 11  . metFORMIN (GLUCOPHAGE-XR) 500 MG 24 hr tablet Take 1 tablet (500 mg total) by mouth daily with breakfast. 30 tablet 5  . Multiple Vitamins-Minerals (MENS 50+ MULTI VITAMIN/MIN PO) Take 1 tablet by mouth daily.     No current facility-administered medications for this visit.   Facility-Administered Medications Ordered in Other Visits  Medication Dose Route Frequency Provider Last Rate Last Dose  . influenza  inactive virus vaccine (FLUZONE/FLUARIX)  injection 0.5 mL  0.5 mL Intramuscular Once Rita Ohara, MD        Allergies:   Horse-derived products; Pravastatin; and Adhesive   Past Medical History  Diagnosis Date  . HLD (hyperlipidemia)   . Syncope 2011    a. 2011 -negative workup except LBBB. thought to be vasovagal  . LBBB (left bundle branch block)   . Hypertension 05/2013  . Diabetes mellitus, type 2   . Obstructive sleep apnea     a. not compliant with CPAP  . Atrial enlargement, left   . Mitral regurgitation   . Tricuspid regurgitation   . Mobitz type 2 second degree heart block     a. s/p Medtronic Adapta L model ADDRL 1 (serial number [id number]) pacemaker. 07/02/14  . Pacemaker     a. Medtronic Adapta L model ADDRL 1 (serial number [id number]) pacemaker.  . Melanoma 1968    R arm    Past Surgical History  Procedure Laterality Date  . Knee arthroscopy  02/2011    L knee for meniscal tear. [name]  . Inguinal hernia repair      bilateral  . Finger tendon  repair      R 2nd and 3rd finger  . Uvulectomy      laser treatment for sleep apnea (NOT UP3)  . Vasectomy    . Permanent pacemaker insertion N/A 07/01/2014    Procedure: PERMANENT PACEMAKER INSERTION;  Surgeon: [name], MD;  Location: [facility] CATH LAB;  Service: Cardiovascular;  Laterality: N/A;     Social History:  The patient  reports that he has never smoked. He has never used smokeless tobacco. He reports that he drinks alcohol. He reports that he does not use illicit drugs.   Family History:  The patient's family history includes Cancer in his mother; Dementia in his mother and paternal aunt; Diabetes in his maternal grandmother and sister; Heart disease in his maternal grandfather; Melanoma (age of onset: 32) in his mother; Stroke (age of onset: 15) in his father.    ROS:  Please see the history of present illness. All other systems are reviewed and  Negative to the above problem except as noted.    PHYSICAL EXAM: VS:  BP 110/72 mmHg   Pulse 78  Ht 6' (1.829 m)  Wt 200 lb 6.4 oz (90.901 kg)  BMI 27.17 kg/m2  SpO2 92%  GEN: Well nourished, well developed, in no acute distress HEENT: normal Neck: no JVD, carotid bruits, or masses Cardiac: RRR; no murmurs, rubs, or gallops,no edema  Respiratory:  clear to auscultation bilaterally, normal work of breathing GI: soft, nontender, nondistended, + BS  No hepatomegaly  MS: no deformity Moving all extremities   Skin: warm and dry, no rash Neuro:  Strength and sensation are intact Psych: euthymic mood, full affect   EKG:  EKG is not  ordered today.   Lipid Panel    Component Value Date/Time   CHOL 135 07/16/2014 0934   TRIG 54 07/16/2014 0934   HDL 55 07/16/2014 0934   CHOLHDL 2.5 07/16/2014 0934   VLDL 11 07/16/2014 0934   LDLCALC 69 07/16/2014 0934      Wt Readings from Last 3 Encounters:  12/17/14 200 lb 6.4 oz (90.901 kg)  10/24/14 199 lb 9.6 oz (90.538 kg)  08/15/14 196 lb (88.905 kg)      ASSESSMENT AND PLAN: 1.  Rhythm.  Patinet now with PPM  Follows with [facility] Echo in April with decline in LVEF  Will review    2.  Valvular dz  I have reviewed echo images  There is mild MR  I do not think clinically   3.  HTN  BP is under good control    4.  Sleep apnea   5.  DM  Continue metformin.    6.  HL  Continues on lipitor       Signed, Dorris Carnes, MD  12/17/2014 10:19 AM    [facility] [place], [place],   [address] Phone: [phone]; Fax: [phone]

## 2014-12-17 NOTE — Patient Instructions (Signed)
Medication Instructions:  Your physician recommends that you continue on your current medications as directed. Please refer to the Current Medication list given to you today.  Labwork: No new orders.   Testing/Procedures: No new orders.   Follow-Up: Dr Harrington Challenger will determine plan of care after she speaks with Dr Rayann Heman.   Any Other Special Instructions Will Be Listed Below (If Applicable).

## 2014-12-31 NOTE — Progress Notes (Signed)
Pt needs appt with J Allred this summer to evaluate decline in LVEF Discussed with J allred

## 2015-01-01 ENCOUNTER — Telehealth: Payer: Self-pay | Admitting: Internal Medicine

## 2015-01-01 NOTE — Telephone Encounter (Signed)
I have reviewed with Dr Rayann Heman He would like to see pt in clinic Please set up appt the next several wks  Pt should bring wife (nurse) to complete discussion

## 2015-01-01 NOTE — Telephone Encounter (Signed)
New message      Pt was out of town recently.  Pt said Dr Harrington Challenger was going to talk to Dr Rayann Heman and call him.  He knows someone called while he was on vacation.  Do we know what Dr Harrington Challenger was going to tell him?

## 2015-01-01 NOTE — Telephone Encounter (Signed)
Reviewed chart from last OV, Dr Harrington Challenger will determine plan of care after she speaks with Dr Rayann Heman, routed to Dr. Harrington Challenger and Rodman Key RN. Patient called inquiring if Dr. Harrington Challenger had a chance to do this yet. Let patient know that will send the message but Dr. Harrington Challenger is not in the office today.

## 2015-01-02 NOTE — Telephone Encounter (Signed)
Forwarded scheduler message to set up appointment with Dr. Rayann Heman

## 2015-01-07 ENCOUNTER — Other Ambulatory Visit: Payer: Self-pay

## 2015-01-16 ENCOUNTER — Encounter: Payer: Self-pay | Admitting: *Deleted

## 2015-01-16 ENCOUNTER — Ambulatory Visit (INDEPENDENT_AMBULATORY_CARE_PROVIDER_SITE_OTHER): Payer: Medicare HMO | Admitting: Internal Medicine

## 2015-01-16 ENCOUNTER — Telehealth: Payer: Self-pay | Admitting: Internal Medicine

## 2015-01-16 ENCOUNTER — Encounter: Payer: Self-pay | Admitting: Internal Medicine

## 2015-01-16 VITALS — BP 124/72 | HR 66 | Ht 73.5 in | Wt 201.6 lb

## 2015-01-16 DIAGNOSIS — I428 Other cardiomyopathies: Secondary | ICD-10-CM

## 2015-01-16 DIAGNOSIS — I441 Atrioventricular block, second degree: Secondary | ICD-10-CM | POA: Diagnosis not present

## 2015-01-16 DIAGNOSIS — I10 Essential (primary) hypertension: Secondary | ICD-10-CM

## 2015-01-16 DIAGNOSIS — I429 Cardiomyopathy, unspecified: Secondary | ICD-10-CM | POA: Diagnosis not present

## 2015-01-16 DIAGNOSIS — R001 Bradycardia, unspecified: Secondary | ICD-10-CM | POA: Diagnosis not present

## 2015-01-16 LAB — CUP PACEART INCLINIC DEVICE CHECK
Battery Impedance: 100 Ohm
Battery Remaining Longevity: 146 mo
Battery Voltage: 2.78 V
Brady Statistic AP VP Percent: 10 %
Brady Statistic AP VS Percent: 1 %
Brady Statistic AS VP Percent: 44 %
Brady Statistic AS VS Percent: 45 %
Date Time Interrogation Session: 20160706131416
Lead Channel Impedance Value: 505 Ohm
Lead Channel Impedance Value: 529 Ohm
Lead Channel Pacing Threshold Amplitude: 0.5 V
Lead Channel Pacing Threshold Amplitude: 0.75 V
Lead Channel Pacing Threshold Pulse Width: 0.4 ms
Lead Channel Pacing Threshold Pulse Width: 0.4 ms
Lead Channel Sensing Intrinsic Amplitude: 4 mV
Lead Channel Sensing Intrinsic Amplitude: 8 mV
Lead Channel Setting Pacing Amplitude: 2 V
Lead Channel Setting Pacing Amplitude: 2.5 V
Lead Channel Setting Pacing Pulse Width: 0.4 ms
Lead Channel Setting Sensing Sensitivity: 2.8 mV

## 2015-01-16 NOTE — Telephone Encounter (Signed)
noted 

## 2015-01-16 NOTE — Patient Instructions (Signed)
Medication Instructions: - no changes  Labwork: - Your physician recommends that you return for lab work: 02/22/15 (CBC/ BMP/ INR)  Procedures/Testing: -  Upgrading your device to a Bi-Ventricular (Bi-V)/ (CRT) pacemaker  Follow-Up: - Your physician recommends that you schedule a follow-up appointment: 10 days post procedure with the device clinic (procedure date is 03/01/15).  Any Additional Special Instructions Will Be Listed Below (If Applicable). - none

## 2015-01-16 NOTE — Telephone Encounter (Signed)
Pt wanted you to know that he is having to get a new pacemaker and he will be getting a biV one as the other one is weak. He is coming in this month on the 18th but just wanted you to know that before he comes in

## 2015-01-18 DIAGNOSIS — I428 Other cardiomyopathies: Secondary | ICD-10-CM | POA: Insufficient documentation

## 2015-01-18 NOTE — Progress Notes (Signed)
Electrophysiology Office Note   Date:  01/18/2015   ID:  Alexander, Curtis 1945/10/10, MRN [id number]  PCP:  Vikki Ports, MD  Cardiologist:  Dr Harrington Challenger (hasnt seen since 2011) Primary Electrophysiologist: [name], MD    Chief Complaint  Patient presents with  . Bradycardia  . Mobitz type 2 second degree heart block     History of Present Illness: Alexander Curtis is a 69 y.o. male who presents today for electrophysiology evaluation.   He is doing well since his PPM implant.  SOB has returned.  Denies CP or any ischemic symptoms.   EF has declined with V pacing. Today, he denies symptoms of palpitations, chest pain, shortness of breath, orthopnea, PND, lower extremity edema, claudication, dizziness, presyncope, syncope, bleeding, or neurologic sequela. The patient is tolerating medications without difficulties and is otherwise without complaint today.    Past Medical History  Diagnosis Date  . HLD (hyperlipidemia)   . Syncope 2011    a. 2011 -negative workup except LBBB. thought to be vasovagal  . LBBB (left bundle branch block)   . Hypertension 05/2013  . Diabetes mellitus, type 2   . Obstructive sleep apnea     a. not compliant with CPAP  . Atrial enlargement, left   . Mitral regurgitation   . Tricuspid regurgitation   . Mobitz type 2 second degree heart block     a. s/p Medtronic Adapta L model ADDRL 1 (serial number [id number]) pacemaker. 07/02/14  . Pacemaker     a. Medtronic Adapta L model ADDRL 1 (serial number [id number]) pacemaker.  . Melanoma 1968    R arm   Past Surgical History  Procedure Laterality Date  . Knee arthroscopy  02/2011    L knee for meniscal tear. [name]  . Inguinal hernia repair      bilateral  . Finger tendon repair      R 2nd and 3rd finger  . Uvulectomy      laser treatment for sleep apnea (NOT UP3)  . Vasectomy    . Permanent pacemaker insertion N/A 07/01/2014    Procedure: PERMANENT PACEMAKER INSERTION;  Surgeon: [name], MD;  Location: [facility] CATH LAB;  Service: Cardiovascular;  Laterality: N/A;     Current Outpatient Prescriptions  Medication Sig Dispense Refill  . aspirin 81 MG tablet Take 81 mg by mouth every other day.    Marland Kitchen atorvastatin (LIPITOR) 10 MG tablet Take 1 tablet (10 mg total) by mouth daily. 90 tablet 1  . CINNAMON PO Take 1,000 mg by mouth 3 (three) times daily.     . Coenzyme Q10 (COQ10) 200 MG CAPS Take 2 capsules by mouth daily.    . Cyanocobalamin (VITAMIN B-12 PO) Take 1 capsule by mouth daily.    . fish oil-omega-3 fatty acids 1000 MG capsule Take 3 g by mouth daily.     Marland Kitchen lisinopril (PRINIVIL,ZESTRIL) 10 MG tablet Take 1 tablet (10 mg total) by mouth daily. 30 tablet 11  . metFORMIN (GLUCOPHAGE-XR) 500 MG 24 hr tablet Take 1 tablet (500 mg total) by mouth daily with breakfast. 30 tablet 5  . Multiple Vitamins-Minerals (MENS 50+ MULTI VITAMIN/MIN PO) Take 1 tablet by mouth daily.     No current facility-administered medications for this visit.   Facility-Administered Medications Ordered in Other Visits  Medication Dose Route Frequency Provider Last Rate Last Dose  . influenza  inactive virus vaccine (FLUZONE/FLUARIX) injection 0.5 mL  0.5 mL Intramuscular  Once Rita Ohara, MD        Allergies:   Horse-derived products; Pravastatin; and Adhesive   Social History:  The patient  reports that he has never smoked. He has never used smokeless tobacco. He reports that he drinks alcohol. He reports that he does not use illicit drugs.   Family History:  The patient's family history includes Cancer in his mother; Dementia in his mother and paternal aunt; Diabetes in his maternal grandmother and sister; Heart disease in his maternal grandfather; Melanoma (age of onset: 71) in his mother; Stroke (age of onset: 75) in his father.    ROS:  Please see the history of present illness.   All other systems are reviewed and negative.    PHYSICAL EXAM: VS:  BP 124/72 mmHg  Pulse 66  Ht 6' 1.5"  (1.867 m)  Wt 91.445 kg (201 lb 9.6 oz)  BMI 26.23 kg/m2 , BMI Body mass index is 26.23 kg/(m^2). GEN: Well nourished, well developed, in no acute distress HEENT: normal Neck: no JVD, carotid bruits, or masses Cardiac: RRR; 2/6 SEM LLSB, 2/6 SEM at the apex Respiratory:  clear to auscultation bilaterally, normal work of breathing GI: soft, nontender, nondistended, + BS MS: no deformity or atrophy Skin: warm and dry, device pocket is well healed Neuro:  Strength and sensation are intact Psych: euthymic mood, full affect  Device interrogation is reviewed today in detail.  See PaceArt for details.   Recent Labs: 06/28/2014: TSH 1.405 06/29/2014: Magnesium 1.9 06/30/2014: Hemoglobin 12.2*; Platelets 140*; Pro B Natriuretic peptide (BNP) 86.6 07/02/2014: BUN 18; Creatinine, Ser 1.00; Potassium 4.2; Sodium 139 07/16/2014: ALT 23    Lipid Panel     Component Value Date/Time   CHOL 135 07/16/2014 0934   TRIG 54 07/16/2014 0934   HDL 55 07/16/2014 0934   CHOLHDL 2.5 07/16/2014 0934   VLDL 11 07/16/2014 0934   LDLCALC 69 07/16/2014 0934     Wt Readings from Last 3 Encounters:  01/16/15 91.445 kg (201 lb 9.6 oz)  12/17/14 90.901 kg (200 lb 6.4 oz)  10/24/14 90.538 kg (199 lb 9.6 oz)      Other studies Reviewed: Additional studies/ records that were reviewed today include: Dr Harrington Challenger' note from 2011,  Echo   ASSESSMENT AND PLAN:  1. Mobitz II second degree AV block Normal pacemaker function See Pace Art report No changes today  2. OSA/ severe LAE Compliance with CPAP is encouraged  3. Nonischemic CM/ acute systolic dysfunction Given reduced EF with V pacing > 40%, I would advise upgrade to CRT-P device. He does not have EF <35% and thereore dose not meet criteria for an ICD. Risks, benefits, and alternatives to LV lead placement/ device upgrade were discussed in detail today.  The patient understands that risks include but are not limited to bleeding, infection,  pneumothorax, perforation, tamponade, vascular damage, renal failure, MI, stroke, death, damage to his existing leads, and lead dislodgement and wishes to proceed.  We will therefore schedule the procedure at the next available time.   4. HTN Stable No change required today  5. MR/TR Stable No change required today  Today, I have spent 40   minutes with the patient discussing upgrade of his device .  More than 50% of the visit time today was spent on this issue.    Current medicines are reviewed at length with the patient today.   The patient does not have concerns regarding his medicines.  The following changes were made  today:  none  Labs/ tests ordered today include:  Orders Placed This Encounter  Procedures  . Basic Metabolic Panel (BMET)  . CBC with Differential/Platelet  . INR/PT  . Implantable device check    Signed, [name], MD  01/18/2015 8:29 AM     [facility] [address] [place] [address] [phone] (office) [phone] (fax)

## 2015-01-23 ENCOUNTER — Encounter: Payer: Self-pay | Admitting: Internal Medicine

## 2015-01-24 ENCOUNTER — Other Ambulatory Visit (HOSPITAL_COMMUNITY): Payer: Self-pay | Admitting: *Deleted

## 2015-01-24 DIAGNOSIS — I519 Heart disease, unspecified: Secondary | ICD-10-CM

## 2015-01-24 DIAGNOSIS — I441 Atrioventricular block, second degree: Secondary | ICD-10-CM

## 2015-01-28 ENCOUNTER — Encounter: Payer: Self-pay | Admitting: Family Medicine

## 2015-01-28 ENCOUNTER — Other Ambulatory Visit: Payer: Self-pay | Admitting: Family Medicine

## 2015-01-28 ENCOUNTER — Ambulatory Visit (INDEPENDENT_AMBULATORY_CARE_PROVIDER_SITE_OTHER): Payer: Medicare HMO | Admitting: Family Medicine

## 2015-01-28 VITALS — BP 128/70 | HR 72 | Ht 72.5 in | Wt 201.2 lb

## 2015-01-28 DIAGNOSIS — E119 Type 2 diabetes mellitus without complications: Secondary | ICD-10-CM | POA: Diagnosis not present

## 2015-01-28 DIAGNOSIS — R931 Abnormal findings on diagnostic imaging of heart and coronary circulation: Secondary | ICD-10-CM

## 2015-01-28 DIAGNOSIS — G4733 Obstructive sleep apnea (adult) (pediatric): Secondary | ICD-10-CM

## 2015-01-28 DIAGNOSIS — R5383 Other fatigue: Secondary | ICD-10-CM

## 2015-01-28 DIAGNOSIS — R0989 Other specified symptoms and signs involving the circulatory and respiratory systems: Secondary | ICD-10-CM

## 2015-01-28 DIAGNOSIS — E7800 Pure hypercholesterolemia, unspecified: Secondary | ICD-10-CM

## 2015-01-28 DIAGNOSIS — I441 Atrioventricular block, second degree: Secondary | ICD-10-CM

## 2015-01-28 LAB — CBC WITH DIFFERENTIAL/PLATELET
Basophils Absolute: 0 10*3/uL (ref 0.0–0.1)
Basophils Relative: 0 % (ref 0–1)
Eosinophils Absolute: 0.1 10*3/uL (ref 0.0–0.7)
Eosinophils Relative: 1 % (ref 0–5)
HCT: 42.2 % (ref 39.0–52.0)
Hemoglobin: 14 g/dL (ref 13.0–17.0)
Lymphocytes Relative: 15 % (ref 12–46)
Lymphs Abs: 0.9 10*3/uL (ref 0.7–4.0)
MCH: 29.9 pg (ref 26.0–34.0)
MCHC: 33.2 g/dL (ref 30.0–36.0)
MCV: 90.2 fL (ref 78.0–100.0)
MPV: 9 fL (ref 8.6–12.4)
Monocytes Absolute: 0.6 10*3/uL (ref 0.1–1.0)
Monocytes Relative: 10 % (ref 3–12)
Neutro Abs: 4.4 10*3/uL (ref 1.7–7.7)
Neutrophils Relative %: 74 % (ref 43–77)
Platelets: 152 10*3/uL (ref 150–400)
RBC: 4.68 MIL/uL (ref 4.22–5.81)
RDW: 14.4 % (ref 11.5–15.5)
WBC: 5.9 10*3/uL (ref 4.0–10.5)

## 2015-01-28 LAB — COMPREHENSIVE METABOLIC PANEL
ALT: 24 U/L (ref 0–53)
AST: 26 U/L (ref 0–37)
Albumin: 4.3 g/dL (ref 3.5–5.2)
Alkaline Phosphatase: 60 U/L (ref 39–117)
BUN: 16 mg/dL (ref 6–23)
CO2: 28 mEq/L (ref 19–32)
Calcium: 9.4 mg/dL (ref 8.4–10.5)
Chloride: 105 mEq/L (ref 96–112)
Creat: 0.94 mg/dL (ref 0.50–1.35)
Glucose, Bld: 106 mg/dL — ABNORMAL HIGH (ref 70–99)
Potassium: 4.1 mEq/L (ref 3.5–5.3)
Sodium: 142 mEq/L (ref 135–145)
Total Bilirubin: 0.8 mg/dL (ref 0.2–1.2)
Total Protein: 6.8 g/dL (ref 6.0–8.3)

## 2015-01-28 LAB — HEMOGLOBIN A1C
Hgb A1c MFr Bld: 6.4 % — ABNORMAL HIGH (ref <5.7)
Mean Plasma Glucose: 137 mg/dL — ABNORMAL HIGH (ref <117)

## 2015-01-28 MED ORDER — METFORMIN HCL ER 500 MG PO TB24: 500.0000 mg | ORAL_TABLET | Freq: Every day | ORAL | Status: DC

## 2015-01-28 MED ORDER — ATORVASTATIN CALCIUM 10 MG PO TABS: 10.0000 mg | ORAL_TABLET | Freq: Every day | ORAL | Status: DC

## 2015-01-28 NOTE — Patient Instructions (Addendum)
Echocardiogram 10/2014:  Study Conclusions  - Left ventricle: The cavity size was normal. Wall thickness was increased in a pattern of mild LVH. Systolic function was moderately reduced. The estimated ejection fraction was in the range of 35% to 40%. Severe hypokinesis of the anteroseptal myocardium. Doppler parameters are consistent with abnormal left ventricular relaxation (grade 1 diastolic dysfunction). - Left atrium: The atrium was mildly dilated.   Continue your current medications. Try and get back on treatment for sleep apnea (I know you are waiting for equipment).

## 2015-01-28 NOTE — Progress Notes (Signed)
Chief Complaint  Patient presents with  . Hypertension    nonfasting med check. Would like copy or at least to discuss OV with Dr.Allred and Dr.Ross (I am not allowed to print these per [facility]).   Patient presents for 6 month follow-up.  He is reporting that he fatigues easily with exertion, and sleepy in the afternoons, needing naps.  No shortness of breath.  Hyperlipidemia: Patient has been taking lipitor regularly.  He has some soreness in his upper arms/shoulders, tolerable. He is trying to follow a lowfat, low cholesterol diet. He cut back on bacon, switched to egg whites, cutting back on cheese.  Recently went on a cruise, so diet wasn't quite as good. He is not fasting today. Lab Results  Component Value Date   CHOL 135 07/16/2014   HDL 55 07/16/2014   LDLCALC 69 07/16/2014   TRIG 54 07/16/2014   CHOLHDL 2.5 07/16/2014    Diabetes--he was started on metformin after his visit in October. Tolerating it without side effects. Sugar was 111 today (after some key lime pie yesterday).  It tends to be under 100 if good about avoiding bread and sweets. Only checks sugar in the mornings.. His exercise has been limited related to the bradycardia and pacemaker. He stopped cutting the grass even a couple of weeks ago (hilly, exertional).  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 Shortness of breath, energy was much better, but energy started to decline again in the Spring.  No recurrent shortness of breath. Dr. Rayann Heman recently referred him to Dr. Harrington Challenger, had echo.  He saw Dr. Rayann Heman again earlier this month, and a new pacemaker was recommended, as EF had declined to 35-40?Marland Kitchen Per Dr. Jackalyn Lombard last visit:  Given reduced EF with V pacing > 40%, I would advise upgrade to CRT-P device.  He does not have EF <35% and therefore dose not meet criteria for an ICD. Risks, benefits, and alternatives to LV lead placement/ device upgrade were discussed in detail  today.This is scheduled for mid August.  Hypertension: Lisinopril dose was increased back up to 10mg  while in hospital. BP's at home have been 120's-132/70's, pulse 70's.  Sleep apnea--saw Dr. Erik Obey. He had to have another sleep study. He is a mouth breather.  He was waking up within 1-2 hours of falling asleep with very dry mouth.  Not improved with change in humidified air.  He went back to Dr. Erik Obey, who suggested a nasal device with a chin strap.  This has been ordered.  He hasn't been using his CPAP, waiting on equipment. Holding off on any surgery until last resort.  PMH, PSH, SH reviewed and updated.  Outpatient Encounter Prescriptions as of 01/28/2015  Medication Sig Note  . aspirin 81 MG tablet Take 81 mg by mouth every other day.   Marland Kitchen atorvastatin (LIPITOR) 10 MG tablet Take 1 tablet (10 mg total) by mouth daily.   Marland Kitchen CINNAMON PO Take 1,000 mg by mouth 3 (three) times daily.  06/29/2014: ....  . Coenzyme Q10 (COQ10) 200 MG CAPS Take 2 capsules by mouth daily.   . Cyanocobalamin (VITAMIN B-12 PO) Take 1 capsule by mouth daily.   . fish oil-omega-3 fatty acids 1000 MG capsule Take 3 g by mouth daily.    Marland Kitchen lisinopril (PRINIVIL,ZESTRIL) 10 MG tablet Take 1 tablet (10 mg total) by mouth daily.   . metFORMIN (GLUCOPHAGE-XR) 500 MG 24 hr tablet Take 1 tablet (500 mg total) by mouth daily with breakfast.   .  Multiple Vitamins-Minerals (MENS 50+ MULTI VITAMIN/MIN PO) Take 1 tablet by mouth daily.   . vitamin C (ASCORBIC ACID) 500 MG tablet Take 500 mg by mouth daily.    Facility-Administered Encounter Medications as of 01/28/2015  Medication  . influenza  inactive virus vaccine (FLUZONE/FLUARIX) injection 0.5 mL   Allergies  Allergen Reactions  . Horse-Derived Products Anaphylaxis    REACTION: Anaphlactic Reaction.  Can't take tetanus shot  . Pravastatin Other (See Comments)    Joint pain   . Adhesive [Tape] Itching and Rash   ROS: no fevers, chills, headaches, dizziness, chest  pain, shortness of breath, URI symptoms.  Slight cough/hoarse voice since yesterday.  Denies nausea, vomiting, heartburn, change in bowels. Denies bleeding, bruising, rash.  Moods are good.  +fatigue.  PHYSICAL EXAM: BP 128/70 mmHg  Pulse 72  Ht 6' 0.5" (1.842 m)  Wt 201 lb 3.2 oz (91.264 kg)  BMI 26.90 kg/m2 Well developed, pleasant male in no distress HEENT: PERRL, EOMI, conjunctiva clear. OP clear Neck: no lymphadenopathy, thyromegaly or mass Heart: regular rate and rhythm without murmur Lungs: clear bilaterally Abdomen: soft, nontender, no organomegaly or mass Extremities: 2+ edema, no clubbing, cyanosis or edema. Some callouses. Decreased monofilament sensation over the right 5th toe (not calloused).  ASSESSMENT/PLAN:  Other fatigue - suspect this is related to decreased EF, possibly OSA (untreated) contributing.  R/O other causes.  TSH normal <1 year ago - Plan: CBC with Differential/Platelet, Comprehensive metabolic panel, Testosterone, Vit D  25 hydroxy (rtn osteoporosis monitoring)  Type 2 diabetes mellitus, controlled - Plan: Hemoglobin A1c, Comprehensive metabolic panel  Obstructive sleep apnea - continue to work with to find comfortable/tolerable mask/treatment  Mobitz type 2 second degree heart block  Pure hypercholesterolemia - at goal.  continue atorvastatin - Plan: atorvastatin (LIPITOR) 10 MG tablet  Type 2 diabetes mellitus without complication - controlled.  Continue Metformin - Plan: metFORMIN (GLUCOPHAGE-XR) 500 MG 24 hr tablet  Left ventricular ejection fraction less than 40% - f/u with Dr. Rayann Heman. plans for new pacemaker next month  Advised to periodically check sugars post-prandially. Continue current meds. Will advise of test results when available. F/u as scheduled for new pacemaker Encouraged to continue to work on finding comfortable/tolerable treatment for OSA  F/u 6 mos CPE

## 2015-01-29 LAB — VITAMIN D 25 HYDROXY (VIT D DEFICIENCY, FRACTURES): Vit D, 25-Hydroxy: 35 ng/mL (ref 30–100)

## 2015-01-29 LAB — TESTOSTERONE: Testosterone: 238 ng/dL — ABNORMAL LOW (ref 300–890)

## 2015-02-04 ENCOUNTER — Telehealth: Payer: Self-pay | Admitting: *Deleted

## 2015-02-04 ENCOUNTER — Other Ambulatory Visit: Payer: Self-pay | Admitting: *Deleted

## 2015-02-04 MED ORDER — ESZOPICLONE 2 MG PO TABS: 2.0000 mg | ORAL_TABLET | Freq: Every evening | ORAL | Status: DC | PRN

## 2015-02-04 NOTE — Telephone Encounter (Signed)
Ok for  15

## 2015-02-04 NOTE — Telephone Encounter (Signed)
Patient traveling to [place] in October and forgot to ask for rx for Alexander Curtis (given last Oct for same reason-#15)-would like to know if you would rx again for #15 for this upcoming trip. Thanks.

## 2015-02-22 ENCOUNTER — Other Ambulatory Visit (INDEPENDENT_AMBULATORY_CARE_PROVIDER_SITE_OTHER): Payer: Medicare HMO | Admitting: *Deleted

## 2015-02-22 DIAGNOSIS — I441 Atrioventricular block, second degree: Secondary | ICD-10-CM

## 2015-02-22 DIAGNOSIS — R001 Bradycardia, unspecified: Secondary | ICD-10-CM

## 2015-02-22 LAB — CBC WITH DIFFERENTIAL/PLATELET
Basophils Absolute: 0 10*3/uL (ref 0.0–0.1)
Basophils Relative: 0.5 % (ref 0.0–3.0)
Eosinophils Absolute: 0.1 10*3/uL (ref 0.0–0.7)
Eosinophils Relative: 3.7 % (ref 0.0–5.0)
HCT: 39.8 % (ref 39.0–52.0)
Hemoglobin: 13.4 g/dL (ref 13.0–17.0)
Lymphocytes Relative: 24.8 % (ref 12.0–46.0)
Lymphs Abs: 0.9 10*3/uL (ref 0.7–4.0)
MCHC: 33.6 g/dL (ref 30.0–36.0)
MCV: 90.2 fl (ref 78.0–100.0)
Monocytes Absolute: 0.5 10*3/uL (ref 0.1–1.0)
Monocytes Relative: 14.5 % — ABNORMAL HIGH (ref 3.0–12.0)
Neutro Abs: 2 10*3/uL (ref 1.4–7.7)
Neutrophils Relative %: 56.5 % (ref 43.0–77.0)
Platelets: 148 10*3/uL — ABNORMAL LOW (ref 150.0–400.0)
RBC: 4.41 Mil/uL (ref 4.22–5.81)
RDW: 14.6 % (ref 11.5–15.5)
WBC: 3.5 10*3/uL — ABNORMAL LOW (ref 4.0–10.5)

## 2015-02-22 LAB — BASIC METABOLIC PANEL
BUN: 19 mg/dL (ref 6–23)
CO2: 30 mEq/L (ref 19–32)
Calcium: 9.5 mg/dL (ref 8.4–10.5)
Chloride: 105 mEq/L (ref 96–112)
Creatinine, Ser: 1 mg/dL (ref 0.40–1.50)
GFR: 78.6 mL/min (ref >60.00)
Glucose, Bld: 109 mg/dL — ABNORMAL HIGH (ref 70–99)
Potassium: 4.1 mEq/L (ref 3.5–5.1)
Sodium: 140 mEq/L (ref 135–145)

## 2015-02-22 LAB — PROTIME-INR
INR: 1 ratio (ref 0.8–1.0)
Prothrombin Time: 10.6 s (ref 9.6–13.1)

## 2015-02-28 MED ORDER — SODIUM CHLORIDE 0.9 % IR SOLN
80.0000 mg | Status: AC
  Administered 2015-03-01: 80 mg
  Filled 2015-02-28: qty 2

## 2015-03-01 ENCOUNTER — Encounter (HOSPITAL_COMMUNITY)
Admission: RE | Disposition: A | Discharge status: Home / Self Care | Payer: Self-pay | Source: Ambulatory Visit | Attending: Internal Medicine

## 2015-03-01 ENCOUNTER — Ambulatory Visit (HOSPITAL_COMMUNITY)
Admission: RE | Admit: 2015-03-01 | Discharge: 2015-03-02 | Disposition: A | Discharge status: Home / Self Care | Payer: Medicare HMO | Source: Ambulatory Visit | Attending: Internal Medicine | Admitting: Internal Medicine

## 2015-03-01 ENCOUNTER — Encounter (HOSPITAL_COMMUNITY): Payer: Self-pay | Admitting: Internal Medicine

## 2015-03-01 DIAGNOSIS — I5022 Chronic systolic (congestive) heart failure: Secondary | ICD-10-CM | POA: Diagnosis not present

## 2015-03-01 DIAGNOSIS — E119 Type 2 diabetes mellitus without complications: Secondary | ICD-10-CM | POA: Insufficient documentation

## 2015-03-01 DIAGNOSIS — I441 Atrioventricular block, second degree: Secondary | ICD-10-CM | POA: Insufficient documentation

## 2015-03-01 DIAGNOSIS — I10 Essential (primary) hypertension: Secondary | ICD-10-CM | POA: Insufficient documentation

## 2015-03-01 DIAGNOSIS — I081 Rheumatic disorders of both mitral and tricuspid valves: Secondary | ICD-10-CM | POA: Diagnosis not present

## 2015-03-01 DIAGNOSIS — I519 Heart disease, unspecified: Secondary | ICD-10-CM

## 2015-03-01 DIAGNOSIS — I447 Left bundle-branch block, unspecified: Secondary | ICD-10-CM | POA: Diagnosis not present

## 2015-03-01 DIAGNOSIS — I429 Cardiomyopathy, unspecified: Secondary | ICD-10-CM | POA: Diagnosis not present

## 2015-03-01 DIAGNOSIS — I509 Heart failure, unspecified: Secondary | ICD-10-CM | POA: Insufficient documentation

## 2015-03-01 DIAGNOSIS — I428 Other cardiomyopathies: Secondary | ICD-10-CM

## 2015-03-01 DIAGNOSIS — G4733 Obstructive sleep apnea (adult) (pediatric): Secondary | ICD-10-CM | POA: Insufficient documentation

## 2015-03-01 DIAGNOSIS — E785 Hyperlipidemia, unspecified: Secondary | ICD-10-CM | POA: Insufficient documentation

## 2015-03-01 DIAGNOSIS — Z7982 Long term (current) use of aspirin: Secondary | ICD-10-CM | POA: Insufficient documentation

## 2015-03-01 DIAGNOSIS — Z959 Presence of cardiac and vascular implant and graft, unspecified: Secondary | ICD-10-CM

## 2015-03-01 HISTORY — PX: EP IMPLANTABLE DEVICE: SHX172B

## 2015-03-01 LAB — GLUCOSE, CAPILLARY
Glucose-Capillary: 110 mg/dL — ABNORMAL HIGH (ref 65–99)
Glucose-Capillary: 88 mg/dL (ref 65–99)
Glucose-Capillary: 95 mg/dL (ref 65–99)
Glucose-Capillary: 97 mg/dL (ref 65–99)
Glucose-Capillary: 110 mg/dL — ABNORMAL HIGH (ref 65–99)

## 2015-03-01 LAB — SURGICAL PCR SCREEN
MRSA, PCR: NEGATIVE
Staphylococcus aureus: NEGATIVE

## 2015-03-01 SURGERY — BIV UPGRADE
Anesthesia: LOCAL

## 2015-03-01 MED ORDER — MIDAZOLAM HCL 5 MG/5ML IJ SOLN
INTRAMUSCULAR | Status: DC | PRN
  Administered 2015-03-01 (×2): 1 mg via INTRAVENOUS

## 2015-03-01 MED ORDER — ONDANSETRON HCL 4 MG/2ML IJ SOLN: 4.0000 mg | Freq: Four times a day (QID) | INTRAMUSCULAR | Status: DC | PRN

## 2015-03-01 MED ORDER — LIDOCAINE HCL (PF) 1 % IJ SOLN
INTRAMUSCULAR | Status: AC
Start: 2015-03-01 — End: 2015-03-01
  Filled 2015-03-01: qty 30

## 2015-03-01 MED ORDER — HYDROCODONE-ACETAMINOPHEN 5-325 MG PO TABS: 1.0000 | ORAL_TABLET | ORAL | Status: DC | PRN

## 2015-03-01 MED ORDER — MUPIROCIN 2 % EX OINT
TOPICAL_OINTMENT | CUTANEOUS | Status: AC
  Filled 2015-03-01: qty 22

## 2015-03-01 MED ORDER — INSULIN ASPART 100 UNIT/ML ~~LOC~~ SOLN: 0.0000 [IU] | Freq: Three times a day (TID) | SUBCUTANEOUS | Status: DC

## 2015-03-01 MED ORDER — SODIUM CHLORIDE 0.9 % IV SOLN: 250.0000 mL | INTRAVENOUS | Status: DC | PRN

## 2015-03-01 MED ORDER — SODIUM CHLORIDE 0.9 % IJ SOLN: 3.0000 mL | INTRAMUSCULAR | Status: DC | PRN

## 2015-03-01 MED ORDER — CEFAZOLIN SODIUM-DEXTROSE 2-3 GM-% IV SOLR
INTRAVENOUS | Status: AC
  Filled 2015-03-01: qty 50

## 2015-03-01 MED ORDER — FENTANYL CITRATE (PF) 100 MCG/2ML IJ SOLN
INTRAMUSCULAR | Status: DC | PRN
  Administered 2015-03-01: 12.5 ug via INTRAVENOUS

## 2015-03-01 MED ORDER — IOHEXOL 350 MG/ML SOLN
INTRAVENOUS | Status: DC | PRN
  Administered 2015-03-01: 15 mL via INTRAVENOUS

## 2015-03-01 MED ORDER — CEFAZOLIN SODIUM-DEXTROSE 2-3 GM-% IV SOLR
2.0000 g | INTRAVENOUS | Status: AC
  Administered 2015-03-01: 2 g via INTRAVENOUS

## 2015-03-01 MED ORDER — MIDAZOLAM HCL 5 MG/5ML IJ SOLN
INTRAMUSCULAR | Status: AC
  Filled 2015-03-01: qty 25

## 2015-03-01 MED ORDER — ACETAMINOPHEN 325 MG PO TABS
325.0000 mg | ORAL_TABLET | ORAL | Status: DC | PRN
  Administered 2015-03-01: 650 mg via ORAL
  Filled 2015-03-01: qty 2

## 2015-03-01 MED ORDER — SODIUM CHLORIDE 0.9 % IV SOLN
INTRAVENOUS | Status: DC
  Administered 2015-03-01: 1000 mL via INTRAVENOUS

## 2015-03-01 MED ORDER — SODIUM CHLORIDE 0.9 % IJ SOLN
3.0000 mL | Freq: Two times a day (BID) | INTRAMUSCULAR | Status: DC
  Administered 2015-03-01: 15:00:00 3 mL via INTRAVENOUS

## 2015-03-01 MED ORDER — MUPIROCIN 2 % EX OINT
1.0000 "application " | TOPICAL_OINTMENT | Freq: Once | CUTANEOUS | Status: DC
  Filled 2015-03-01: qty 22

## 2015-03-01 MED ORDER — CHLORHEXIDINE GLUCONATE 4 % EX LIQD
60.0000 mL | Freq: Once | CUTANEOUS | Status: DC
  Filled 2015-03-01: qty 60

## 2015-03-01 MED ORDER — CEFAZOLIN SODIUM 1-5 GM-% IV SOLN
1.0000 g | Freq: Four times a day (QID) | INTRAVENOUS | Status: AC
  Administered 2015-03-01 – 2015-03-02 (×3): 1 g via INTRAVENOUS
  Filled 2015-03-01 (×3): qty 50

## 2015-03-01 MED ORDER — LISINOPRIL 10 MG PO TABS
10.0000 mg | ORAL_TABLET | Freq: Every day | ORAL | Status: DC
  Administered 2015-03-01: 10 mg via ORAL
  Filled 2015-03-01: qty 1

## 2015-03-01 MED ORDER — LIDOCAINE HCL (PF) 1 % IJ SOLN
INTRAMUSCULAR | Status: AC
  Filled 2015-03-01: qty 30

## 2015-03-01 MED ORDER — FENTANYL CITRATE (PF) 100 MCG/2ML IJ SOLN
INTRAMUSCULAR | Status: AC
  Filled 2015-03-01: qty 4

## 2015-03-01 MED ORDER — SODIUM CHLORIDE 0.9 % IR SOLN
Status: AC
  Filled 2015-03-01: qty 2

## 2015-03-01 MED ORDER — ANGIOPLASTY BOOK
Freq: Once | Status: DC
  Filled 2015-03-01: qty 1

## 2015-03-01 MED ORDER — LIDOCAINE HCL (PF) 1 % IJ SOLN
INTRAMUSCULAR | Status: DC | PRN
  Administered 2015-03-01: 35 mL via INTRADERMAL

## 2015-03-01 SURGICAL SUPPLY — 17 items
ADAPTER SEALING SSA-EW-09 (MISCELLANEOUS) ×1 IMPLANT
ADPR INTRO LNG 9FR SL XTD WNG (MISCELLANEOUS) ×1
ALLURE CRT PM3262 (Pacemaker) ×2 IMPLANT
CABLE SURGICAL S-101-97-12 (CABLE) ×1 IMPLANT
CATH ATTAIN COMMAND 6250-MB2 (CATHETERS) ×1 IMPLANT
CATH HEXAPOLAR DAMATO 6F (CATHETERS) ×1 IMPLANT
KIT ESSENTIALS PG (KITS) ×1 IMPLANT
LEAD QUARTET 1458QL-86 (Lead) IMPLANT
PACEMAKER ALLURE CRT (Pacemaker) IMPLANT
PAD DEFIB LIFELINK (PAD) ×1 IMPLANT
QUARTET 1458QL-86 (Lead) ×2 IMPLANT
SHEATH CLASSIC 9.5F (SHEATH) ×1 IMPLANT
SHIELD RADPAD SCOOP 12X17 (MISCELLANEOUS) ×1 IMPLANT
SLITTER 6232ADJ (MISCELLANEOUS) ×1 IMPLANT
TRAY PACEMAKER INSERTION (CUSTOM PROCEDURE TRAY) ×1 IMPLANT
WIRE ACUITY WHISPER EDS 4648 (WIRE) ×1 IMPLANT
WIRE HI TORQ VERSACORE-J 145CM (WIRE) ×1 IMPLANT

## 2015-03-01 NOTE — H&P (Signed)
PCP: Vikki Ports, MD Cardiologist: Dr Harrington Challenger (hasnt seen since 2011) Primary Electrophysiologist: [name], MD   Chief Complaint  Patient presents with  . Bradycardia  . Mobitz type 2 second degree heart block    History of Present Illness: Alexander Curtis is a 69 y.o. male who presents today for device upgrade to CRT-P. He is doing well since his PPM implant. SOB has returned. Denies CP or any ischemic symptoms. EF has declined with V pacing. Today, he denies symptoms of palpitations, chest pain, shortness of breath, orthopnea, PND, lower extremity edema, claudication, dizziness, presyncope, syncope, bleeding, or neurologic sequela. The patient is tolerating medications without difficulties and is otherwise without complaint today.    Past Medical History  Diagnosis Date  . HLD (hyperlipidemia)   . Syncope 2011    a. 2011 -negative workup except LBBB. thought to be vasovagal  . LBBB (left bundle branch block)   . Hypertension 05/2013  . Diabetes mellitus, type 2   . Obstructive sleep apnea     a. not compliant with CPAP  . Atrial enlargement, left   . Mitral regurgitation   . Tricuspid regurgitation   . Mobitz type 2 second degree heart block     a. s/p Medtronic Adapta L model ADDRL 1 (serial number [id number]) pacemaker. 07/02/14  . Pacemaker     a. Medtronic Adapta L model ADDRL 1 (serial number [id number]) pacemaker.  . Melanoma 1968    R arm   Past Surgical History  Procedure Laterality Date  . Knee arthroscopy  02/2011    L knee for meniscal tear. [name]  . Inguinal hernia repair      bilateral  . Finger tendon repair      R 2nd and 3rd finger  . Uvulectomy      laser treatment for sleep apnea (NOT UP3)  . Vasectomy    . Permanent pacemaker insertion N/A 07/01/2014    Procedure: PERMANENT PACEMAKER INSERTION; Surgeon: [name], MD; Location: [facility] CATH LAB; Service: Cardiovascular; Laterality: N/A;     Current Outpatient Prescriptions  Medication Sig Dispense Refill  . aspirin 81 MG tablet Take 81 mg by mouth every other day.    Marland Kitchen atorvastatin (LIPITOR) 10 MG tablet Take 1 tablet (10 mg total) by mouth daily. 90 tablet 1  . CINNAMON PO Take 1,000 mg by mouth 3 (three) times daily.     . Coenzyme Q10 (COQ10) 200 MG CAPS Take 2 capsules by mouth daily.    . Cyanocobalamin (VITAMIN B-12 PO) Take 1 capsule by mouth daily.    . fish oil-omega-3 fatty acids 1000 MG capsule Take 3 g by mouth daily.     Marland Kitchen lisinopril (PRINIVIL,ZESTRIL) 10 MG tablet Take 1 tablet (10 mg total) by mouth daily. 30 tablet 11  . metFORMIN (GLUCOPHAGE-XR) 500 MG 24 hr tablet Take 1 tablet (500 mg total) by mouth daily with breakfast. 30 tablet 5  . Multiple Vitamins-Minerals (MENS 50+ MULTI VITAMIN/MIN PO) Take 1 tablet by mouth daily.     No current facility-administered medications for this visit.   Facility-Administered Medications Ordered in Other Visits  Medication Dose Route Frequency Provider Last Rate Last Dose  . influenza inactive virus vaccine (FLUZONE/FLUARIX) injection 0.5 mL 0.5 mL Intramuscular Once Rita Ohara, MD      Allergies: Horse-derived products; Pravastatin; and Adhesive   Social History: The patient  reports that he has never smoked. He has never used smokeless tobacco. He reports  that he drinks alcohol. He reports that he does not use illicit drugs.   Family History: The patient's family history includes Cancer in his mother; Dementia in his mother and paternal aunt; Diabetes in his maternal grandmother and sister; Heart disease in his maternal grandfather; Melanoma (age of onset: 66) in his mother; Stroke (age of onset: 41) in his father.    ROS: Please see the history of present illness. All other systems are reviewed and  negative.    PHYSICAL EXAM: Filed Vitals:   03/01/15 0542  BP: 139/82  Pulse: 65  Temp: 97.7 F (36.5 C)  Resp: 17    GEN: Well nourished, well developed, in no acute distress  HEENT: normal  Neck: no JVD, carotid bruits, or masses Cardiac: RRR; 2/6 SEM LLSB, 2/6 SEM at the apex Respiratory: clear to auscultation bilaterally, normal work of breathing GI: soft, nontender, nondistended, + BS MS: no deformity or atrophy  Skin: warm and dry, device pocket is well healed Neuro: Strength and sensation are intact Psych: euthymic mood, full affect   Lipid Panel    Labs (Brief)       Component Value Date/Time   CHOL 135 07/16/2014 0934   TRIG 54 07/16/2014 0934   HDL 55 07/16/2014 0934   CHOLHDL 2.5 07/16/2014 0934   VLDL 11 07/16/2014 0934   LDLCALC 69 07/16/2014 0934       Wt Readings from Last 3 Encounters:  01/16/15 91.445 kg (201 lb 9.6 oz)  12/17/14 90.901 kg (200 lb 6.4 oz)  10/24/14 90.538 kg (199 lb 9.6 oz)      Other studies Reviewed: Additional studies/ records that were reviewed today include: Dr Harrington Challenger' note from 2011, Echo   ASSESSMENT AND PLAN:  1. Mobitz II second degree AV block/ Nonischemic CM/ acute systolic dysfunction Given reduced EF with V pacing > 40%, I would advise upgrade to CRT-P device. He does not have EF <35% and thereore dose not meet criteria for an ICD. Risks, benefits, and alternatives to LV lead placement/ device upgrade were discussed in detail today. The patient understands that risks include but are not limited to bleeding, infection, pneumothorax, perforation, tamponade, vascular damage, renal failure, MI, stroke, death, damage to his existing leads, and lead dislodgement and wishes to proceed.  [name] MD, [facility] 03/01/2015 7:00 AM

## 2015-03-01 NOTE — Progress Notes (Signed)
RT note: Spoke with patient about wearing CPAP tonight. Stated that he did not want to wear one.

## 2015-03-01 NOTE — Discharge Summary (Signed)
ELECTROPHYSIOLOGY PROCEDURE DISCHARGE SUMMARY    [name]: Alexander Curtis,  MRN: [id number], DOB/AGE: 70-Jun-1947 70 y.o.  Admit date: 03/01/2015 Discharge date: 03/02/2015  Primary Care Physician: Vikki Ports, MD Primary Cardiologist: Harrington Challenger Electrophysiologist: Shareen Capwell  Primary Discharge Diagnosis:  Mobitz II heart block, NICM, chronic systolic dysfunction status post upgrade to CRTP this admission  Secondary Discharge Diagnosis:  1.  Hyperlipidemia 2.  Diabetes 3.  Hypertension 4.  OSA   Allergies  Allergen Reactions  . Horse-Derived Products Anaphylaxis    REACTION: Anaphlactic Reaction.  Can't take tetanus shot  . Pravastatin Other (See Comments)    Joint pain   . Adhesive [Tape] Itching and Rash     Procedures This Admission:  1.  Upgrade of a previously implanted MDT dual chamber pacemaker to a STJ CRTP on 03/01/15 by Dr Rayann Heman.  The patient received a STJ model number Quadra Allure MP PPM with model number [id number] LV lead.  The previously implanted 5076 right atrial lead and 5076 right ventricular lead were used. There were no immediate post procedure complications. 2.  CXR on 03/02/15 demonstrated no pneumothorax status post device implantation.   Brief HPI: Alexander Curtis is a 69 y.o. male with a past medical history as outlined above.  He has developed a cardiomyopathy felt to be related to 100% RV pacing with Mobitz II heart block. His EF has remained depressed despite medical therapy.  Risks, benefits, and alternatives to CRTP upgrade were reviewed with the patient who wished to proceed.   Hospital Course:  The patient was admitted and underwent implantation of a STJ CRTP with details as outlined above.  He  was monitored on telemetry overnight which demonstrated AV pacing.  Left chest was without hematoma or ecchymosis.  The device was interrogated and found to be functioning normally.  CXR was obtained and demonstrated no pneumothorax status post device  implantation.  Wound care, arm mobility, and restrictions were reviewed with the patient.  The patient was examined and considered stable for discharge to home.   With CHF, coreg 6.25mg  BID added at discharge. Will also enroll in ICM device clinic.  Physical Exam: Filed Vitals:   03/01/15 1500 03/01/15 1612 03/01/15 1923 03/02/15 0236  BP: 140/75 139/86 131/67 134/73  Pulse: 65 68 63 63  Temp:  97.7 F (36.5 C) 97.9 F (36.6 C) 97.8 F (36.6 C)  TempSrc:  Oral Oral Oral  Resp: 14 16 14 20   Height:      Weight:    93.7 kg (206 lb 9.1 oz)  SpO2: 97% 98% 94% 97%    GEN- The patient is well appearing, alert and oriented x 3 today.   HEENT: normocephalic, atraumatic; sclera clear, conjunctiva pink; hearing intact; oropharynx clear; neck supple, no JVP Lymph- no cervical lymphadenopathy Lungs- Clear to ausculation bilaterally, normal work of breathing.  No wheezes, rales, rhonchi Heart- Regular rate and rhythm, no murmurs, rubs or gallops, PMI not laterally displaced GI- soft, non-tender, non-distended, bowel sounds present, no hepatosplenomegaly Extremities- no clubbing, cyanosis, or edema; DP/PT/radial pulses 2+ bilaterally MS- no significant deformity or atrophy Skin- warm and dry, no rash or lesion, left chest without hematoma/ecchymosis Psych- euthymic mood, full affect Neuro- strength and sensation are intact   Labs:   Lab Results  Component Value Date   WBC 3.5* 02/22/2015   HGB 13.4 02/22/2015   HCT 39.8 02/22/2015   MCV 90.2 02/22/2015   PLT 148.0* 02/22/2015     Recent Labs  Lab 03/02/15 0304  NA 139  K 4.3  CL 104  CO2 28  BUN 13  CREATININE 1.02  CALCIUM 9.0  GLUCOSE 110*    Discharge Medications:    Medication List    TAKE these medications        aspirin 81 MG tablet  Take 81 mg by mouth every other day.     atorvastatin 10 MG tablet  Commonly known as:  LIPITOR  Take 1 tablet (10 mg total) by mouth daily.     carvedilol 6.25 MG tablet    Commonly known as:  COREG  Take 1 tablet (6.25 mg total) by mouth 2 (two) times daily with a meal.     CINNAMON PO  Take 1,000 mg by mouth 3 (three) times daily.     CoQ10 200 MG Caps  Take 1 capsule by mouth 2 (two) times daily.     eszopiclone 2 MG Tabs tablet  Commonly known as:  LUNESTA  Take 1 tablet (2 mg total) by mouth at bedtime as needed for sleep. Take immediately before bedtime     fish oil-omega-3 fatty acids 1000 MG capsule  Take 1 g by mouth 3 (three) times daily.     lisinopril 10 MG tablet  Commonly known as:  PRINIVIL,ZESTRIL  Take 1 tablet (10 mg total) by mouth daily.     MENS 50+ MULTI VITAMIN/MIN PO  Take 1 tablet by mouth daily.     metFORMIN 500 MG 24 hr tablet  Commonly known as:  GLUCOPHAGE-XR  Take 1 tablet (500 mg total) by mouth daily with breakfast.     VITAMIN B-12 PO  Take 1 capsule by mouth daily.     vitamin C 500 MG tablet  Commonly known as:  ASCORBIC ACID  Take 500 mg by mouth daily.        Disposition:   Follow-up Information    Follow up with [facility] On 03/11/2015.   Why:  at 10:30AM for wound check   Contact information:   [address] [place] [address]       Follow up with Patsey Berthold, NP On 04/15/2015.   Specialty:  Nurse Practitioner   Why:  at 9:20AM   Contact information:   [place] [address] [phone]       Follow up with [name], MD On 06/03/2015.   Specialty:  Cardiology   Why:  at 10:30AM   Contact information:   [address] [place] [address] [phone]       Duration of Discharge Encounter: Greater than 30 minutes including physician time.  Army Fossa MD, [facility] 03/02/2015 6:59 AM

## 2015-03-01 NOTE — Progress Notes (Signed)
Pt leaves ha in stable condition.Lt shoulder is unremarkable. Dressing is CDI.

## 2015-03-01 NOTE — Discharge Instructions (Signed)
° ° °  Supplemental Discharge Instructions for  Pacemaker/Defibrillator Patients  Activity No heavy lifting or vigorous activity with your left/right arm for 6 to 8 weeks.  Do not raise your left/right arm above your head for one week.  Gradually raise your affected arm as drawn below.           __         03/05/15                   03/06/15                  03/07/15                    03/08/15  NO DRIVING for   1 week    WOUND CARE - Keep the wound area clean and dry.  Do not get this area wet for one week. No showers for one week; you may shower on    03/08/15 . - The tape/steri-strips on your wound will fall off; do not pull them off.  No bandage is needed on the site.  DO  NOT apply any creams, oils, or ointments to the wound area. - If you notice any drainage or discharge from the wound, any swelling or bruising at the site, or you develop a fever > 101? F after you are discharged home, call the office at once.  Special Instructions - You are still able to use cellular telephones; use the ear opposite the side where you have your pacemaker/defibrillator.  Avoid carrying your cellular phone near your device. - When traveling through airports, show security personnel your identification card to avoid being screened in the metal detectors.  Ask the security personnel to use the hand wand. - Avoid arc welding equipment, MRI testing (magnetic resonance imaging), TENS units (transcutaneous nerve stimulators).  Call the office for questions about other devices. - Avoid electrical appliances that are in poor condition or are not properly grounded. - Microwave ovens are safe to be near or to operate.

## 2015-03-02 ENCOUNTER — Ambulatory Visit (HOSPITAL_COMMUNITY): Payer: Medicare HMO

## 2015-03-02 ENCOUNTER — Other Ambulatory Visit: Payer: Self-pay

## 2015-03-02 DIAGNOSIS — I429 Cardiomyopathy, unspecified: Secondary | ICD-10-CM | POA: Diagnosis not present

## 2015-03-02 DIAGNOSIS — E785 Hyperlipidemia, unspecified: Secondary | ICD-10-CM | POA: Diagnosis not present

## 2015-03-02 DIAGNOSIS — I441 Atrioventricular block, second degree: Secondary | ICD-10-CM | POA: Diagnosis not present

## 2015-03-02 DIAGNOSIS — I509 Heart failure, unspecified: Secondary | ICD-10-CM | POA: Diagnosis not present

## 2015-03-02 LAB — BASIC METABOLIC PANEL
Anion gap: 7 (ref 5–15)
BUN: 13 mg/dL (ref 6–20)
CO2: 28 mmol/L (ref 22–32)
Calcium: 9 mg/dL (ref 8.9–10.3)
Chloride: 104 mmol/L (ref 101–111)
Creatinine, Ser: 1.02 mg/dL (ref 0.61–1.24)
GFR calc Af Amer: >60 mL/min (ref >60)
GFR calc non Af Amer: >60 mL/min (ref >60)
Glucose, Bld: 110 mg/dL — ABNORMAL HIGH (ref 65–99)
Potassium: 4.3 mmol/L (ref 3.5–5.1)
Sodium: 139 mmol/L (ref 135–145)

## 2015-03-02 LAB — GLUCOSE, CAPILLARY: Glucose-Capillary: 102 mg/dL — ABNORMAL HIGH (ref 65–99)

## 2015-03-02 MED ORDER — CARVEDILOL 6.25 MG PO TABS: 6.2500 mg | ORAL_TABLET | Freq: Two times a day (BID) | ORAL | Status: DC

## 2015-03-11 ENCOUNTER — Ambulatory Visit (INDEPENDENT_AMBULATORY_CARE_PROVIDER_SITE_OTHER): Payer: Medicare HMO | Admitting: *Deleted

## 2015-03-11 ENCOUNTER — Ambulatory Visit (HOSPITAL_COMMUNITY)
Admission: RE | Admit: 2015-03-11 | Discharge: 2015-03-11 | Disposition: A | Discharge status: Home / Self Care | Payer: Medicare HMO | Source: Ambulatory Visit | Attending: Internal Medicine | Admitting: Internal Medicine

## 2015-03-11 DIAGNOSIS — R001 Bradycardia, unspecified: Secondary | ICD-10-CM

## 2015-03-11 DIAGNOSIS — Z950 Presence of cardiac pacemaker: Secondary | ICD-10-CM | POA: Insufficient documentation

## 2015-03-11 DIAGNOSIS — I447 Left bundle-branch block, unspecified: Secondary | ICD-10-CM | POA: Diagnosis not present

## 2015-03-11 DIAGNOSIS — I429 Cardiomyopathy, unspecified: Secondary | ICD-10-CM | POA: Diagnosis not present

## 2015-03-11 DIAGNOSIS — I441 Atrioventricular block, second degree: Secondary | ICD-10-CM

## 2015-03-11 DIAGNOSIS — I428 Other cardiomyopathies: Secondary | ICD-10-CM

## 2015-03-13 LAB — CUP PACEART INCLINIC DEVICE CHECK
Battery Remaining Percentage: 95 %
Battery Voltage: 2.98 V
Brady Statistic RA Percent Paced: 22 %
Brady Statistic RV Percent Paced: 98 %
Date Time Interrogation Session: 20160831090457
Lead Channel Impedance Value: 310 Ohm
Lead Channel Impedance Value: 400 Ohm
Lead Channel Impedance Value: 410 Ohm
Lead Channel Pacing Threshold Amplitude: 0.5 V
Lead Channel Pacing Threshold Amplitude: 0.625 V
Lead Channel Pacing Threshold Amplitude: 1.5 V
Lead Channel Pacing Threshold Amplitude: 1.75 V
Lead Channel Pacing Threshold Pulse Width: 0.5 ms
Lead Channel Pacing Threshold Pulse Width: 0.5 ms
Lead Channel Pacing Threshold Pulse Width: 0.5 ms
Lead Channel Pacing Threshold Pulse Width: 0.5 ms
Lead Channel Sensing Intrinsic Amplitude: 3.8 mV
Lead Channel Sensing Intrinsic Amplitude: 7.6 mV
Pulse Gen Serial Number: 3099689

## 2015-03-13 NOTE — Progress Notes (Signed)
Wound check appointment. Steri-strips removed. Wound without redness or edema. Incision edges approximated, wound well healed. Normal device function. RA and RV thresholds, sensing, and impedances consistent with implant measurements. Slight increase noted in LV1 and LV2 thresholds---now 1.5/1.75---cxr ordered. Device programmed at appropriate safety margins. Histogram distribution appropriate for patient and level of activity. No mode switches or high ventricular rates noted. Patient educated about wound care, arm mobility, lifting restrictions. ROV on 10/3 w/ AS and in 3 mos with JA.

## 2015-03-20 ENCOUNTER — Telehealth: Payer: Self-pay | Admitting: *Deleted

## 2015-03-20 NOTE — Telephone Encounter (Signed)
Informed patient that CXR was WNL. Patient voiced understanding.

## 2015-03-20 NOTE — Telephone Encounter (Signed)
-----   Message from [name], MD sent at 03/11/2015  1:58 PM EDT ----- CXR reveals stable lead position   ----- Message -----    From: Rad Results In Interface    Sent: 03/11/2015   1:04 PM      To: [name], MD

## 2015-03-28 ENCOUNTER — Encounter: Payer: Self-pay | Admitting: Internal Medicine

## 2015-04-14 ENCOUNTER — Encounter: Payer: Self-pay | Admitting: Nurse Practitioner

## 2015-04-14 NOTE — Progress Notes (Signed)
Electrophysiology Office Note Date: 04/15/2015  ID:  Alexander, Curtis [birth year], MRN [id number]  PCP: Vikki Ports, MD Primary Cardiologist: Harrington Challenger Electrophysiologist: Allred  CC: 6 week post CRT follow up for heart failure  Alexander Curtis is a 69 y.o. male seen today for Dr Rayann Heman.  He underwent pacemaker implantation in 2015 for symptomatic Mobitz II heart block.  He then developed worsening shortness of breath and exercise intolerance which corresponded with decreased EF.  He underwent CRTP upgrade in August of this year and presents today for HF follow up. Since discharge, the patient reports doing reasonably well.  He denies chest pain, palpitations, dyspnea, PND, orthopnea, nausea, vomiting, dizziness, syncope, edema, weight gain, or early satiety.  His fatigue is improved. He is complaining of intermittent left calf pain that can be exacerbated by walking. He is asking today for a [facility] form to be filled out by Dr Rayann Heman commenting on his opinion between the relationship between his diabetes and heart failure.   Echo 10/2014 demonstrated EF 35-40%, severe hypokinesis of anteroseptal myocardium, grade 1 diastolic dysfunction, LA 41  Device History: MDT dual chamber PPM implanted 2015 for Mobitz II heart block; upgrade to STJ CRTP 02/2015 for NICM, CHF   Past Medical History  Diagnosis Date  . HLD (hyperlipidemia)   . Syncope 2011    a. 2011 -negative workup except LBBB. thought to be vasovagal  . LBBB (left bundle branch block)   . Hypertension 05/2013  . Obstructive sleep apnea     a. not compliant with CPAP  . Atrial enlargement, left   . Mitral regurgitation   . Tricuspid regurgitation   . Mobitz type 2 second degree heart block     a. s/p Medtronic Adapta L model ADDRL 1 (serial number [id number]) pacemaker. 07/02/14 b.  upgrade to STJ CRTP 02/2015  . Melanoma ([place]) 1968    R arm  . Diabetes mellitus, type 2 ([place])   . Non-ischemic cardiomyopathy ([place])     a. felt to be  2/2 RV pacing   Past Surgical History  Procedure Laterality Date  . Knee arthroscopy  02/2011    L knee for meniscal tear. [name]  . Inguinal hernia repair      bilateral  . Finger tendon repair      R 2nd and 3rd finger  . Uvulectomy      laser treatment for sleep apnea (NOT UP3)  . Vasectomy    . Permanent pacemaker insertion N/A 07/01/2014    MDT ADDRL1 pacemaker implanted for Mobitz II Dr Rayann Heman  . Ep implantable device N/A 03/01/2015    STJ CRTP upgrade by Dr Rayann Heman    Current Outpatient Prescriptions  Medication Sig Dispense Refill  . aspirin 81 MG tablet Take 81 mg by mouth every other day.    Marland Kitchen atorvastatin (LIPITOR) 10 MG tablet Take 1 tablet (10 mg total) by mouth daily. 90 tablet 1  . carvedilol (COREG) 6.25 MG tablet Take 1 tablet (6.25 mg total) by mouth 2 (two) times daily with a meal. 60 tablet 3  . CINNAMON PO Take 1,000 mg by mouth 3 (three) times daily.     . Coenzyme Q10 (COQ10) 200 MG CAPS Take 1 capsule by mouth 2 (two) times daily.     . Cyanocobalamin (VITAMIN B-12 PO) Take 1 capsule by mouth daily.    . eszopiclone (LUNESTA) 2 MG TABS tablet Take 1 tablet (2 mg total) by mouth at bedtime as  needed for sleep. Take immediately before bedtime 15 tablet 0  . fish oil-omega-3 fatty acids 1000 MG capsule Take 1 g by mouth 3 (three) times daily.     Marland Kitchen lisinopril (PRINIVIL,ZESTRIL) 10 MG tablet Take 1 tablet (10 mg total) by mouth daily. 30 tablet 11  . metFORMIN (GLUCOPHAGE-XR) 500 MG 24 hr tablet Take 1 tablet (500 mg total) by mouth daily with breakfast. 30 tablet 5  . Multiple Vitamins-Minerals (MENS 50+ MULTI VITAMIN/MIN PO) Take 1 tablet by mouth daily.    . vitamin C (ASCORBIC ACID) 500 MG tablet Take 500 mg by mouth daily.     No current facility-administered medications for this visit.   Facility-Administered Medications Ordered in Other Visits  Medication Dose Route Frequency Provider Last Rate Last Dose  . influenza  inactive virus vaccine  (FLUZONE/FLUARIX) injection 0.5 mL  0.5 mL Intramuscular Once Rita Ohara, MD        Allergies:   Horse-derived products; Pravastatin; and Adhesive   Social History: Social History   Social History  . Marital Status: Married    Spouse Name: N/A  . Number of Children: 4  . Years of Education: N/A   Occupational History  . retired    Social History Main Topics  . Smoking status: Never Smoker   . Smokeless tobacco: Never Used  . Alcohol Use: Yes     Comment: 3-4 drinks per month.  . Drug Use: No  . Sexual Activity: Not on file   Other Topics Concern  . Not on file   Social History Narrative   Lives with wife, cat.  Daughter, Junie Panning, in [place], 2 children in [place], son in [place].   10 grandchildren   Retired school principal       Family History: Family History  Problem Relation Age of Onset  . Dementia Mother   . Cancer Mother     ?type, was told it contributed to death, per pt  . Melanoma Mother 61  . Stroke Father 69    cerebral aneurysm  . Diabetes Sister     borderline, obese  . Dementia Paternal Aunt   . Diabetes Maternal Grandmother   . Heart disease Maternal Grandfather      Review of Systems: All other systems reviewed and are otherwise negative except as noted above.   Physical Exam: VS:  BP 126/70 mmHg  Pulse 65  Resp 18  Ht 6\' 1"  (1.854 m)  Wt 213 lb (96.616 kg)  BMI 28.11 kg/m2  SpO2 97% , BMI Body mass index is 28.11 kg/(m^2).  GEN- The patient is well appearing, alert and oriented x 3 today.   HEENT: normocephalic, atraumatic; sclera clear, conjunctiva pink; hearing intact; oropharynx clear; neck supple Lungs- Clear to ausculation bilaterally, normal work of breathing.  No wheezes, rales, rhonchi Heart- Regular rate and rhythm (paced) GI- soft, non-tender, non-distended, bowel sounds present Extremities- no clubbing, cyanosis, or edema; DP/PT/radial pulses 1+ bilaterally MS- no significant deformity or atrophy Skin- warm and dry, no rash or  lesion; PPM pocket well healed Psych- euthymic mood, full affect Neuro- strength and sensation are intact  PPM Interrogation- reviewed in detail today,  See PACEART report  EKG:  EKG is ordered today. The ekg ordered today shows sinus rhythm with V pacing  Recent Labs: 06/28/2014: TSH 1.405 06/29/2014: Magnesium 1.9 06/30/2014: Pro B Natriuretic peptide (BNP) 86.6 01/28/2015: ALT 24 02/22/2015: Hemoglobin 13.4; Platelets 148.0* 03/02/2015: BUN 13; Creatinine, Ser 1.02; Potassium 4.3; Sodium 139   Wt  Readings from Last 3 Encounters:  04/15/15 213 lb (96.616 kg)  03/02/15 206 lb 9.1 oz (93.7 kg)  01/28/15 201 lb 3.2 oz (91.264 kg)     Other studies Reviewed: Additional studies/ records that were reviewed today include: last echo, Dr Jackalyn Lombard office notes  Assessment and Plan:  1.  Chronic systolic heart failure Euvolemic on exam Continue current medical therapy BMET today Repeat echo 08/2015 (6 months post CRT upgrade)  2.  Mobitz II heart block Normal PPM function See Pace Art report No changes today  3.  HTN Stable No change required today  4.  Left calf pain Will obtain ABI's    Current medicines are reviewed at length with the patient today.   The patient does not have concerns regarding his medicines.  The following changes were made today:  none  Labs/ tests ordered today include: BMET today, echo 08/2015, ABI   Disposition:   Follow up with Dr Rayann Heman 6 weeks as scheduled, Dr Harrington Challenger 6 months   Signed, Chanetta Marshall, NP 04/15/2015 10:29 AM  [facility] [address] [place] [address] [phone] (office) [phone] (fax)

## 2015-04-15 ENCOUNTER — Encounter: Payer: Self-pay | Admitting: Internal Medicine

## 2015-04-15 ENCOUNTER — Ambulatory Visit (INDEPENDENT_AMBULATORY_CARE_PROVIDER_SITE_OTHER): Payer: Medicare HMO | Admitting: Nurse Practitioner

## 2015-04-15 ENCOUNTER — Encounter: Payer: Medicare HMO | Admitting: Nurse Practitioner

## 2015-04-15 ENCOUNTER — Encounter: Payer: Self-pay | Admitting: Nurse Practitioner

## 2015-04-15 ENCOUNTER — Other Ambulatory Visit: Payer: Self-pay | Admitting: Nurse Practitioner

## 2015-04-15 ENCOUNTER — Telehealth: Payer: Self-pay | Admitting: *Deleted

## 2015-04-15 VITALS — BP 126/70 | HR 65 | Resp 18 | Ht 73.0 in | Wt 213.0 lb

## 2015-04-15 DIAGNOSIS — I10 Essential (primary) hypertension: Secondary | ICD-10-CM

## 2015-04-15 DIAGNOSIS — I441 Atrioventricular block, second degree: Secondary | ICD-10-CM

## 2015-04-15 DIAGNOSIS — I5022 Chronic systolic (congestive) heart failure: Secondary | ICD-10-CM

## 2015-04-15 DIAGNOSIS — R001 Bradycardia, unspecified: Secondary | ICD-10-CM | POA: Diagnosis not present

## 2015-04-15 DIAGNOSIS — I739 Peripheral vascular disease, unspecified: Secondary | ICD-10-CM

## 2015-04-15 DIAGNOSIS — M79662 Pain in left lower leg: Secondary | ICD-10-CM | POA: Diagnosis not present

## 2015-04-15 LAB — CUP PACEART INCLINIC DEVICE CHECK
Brady Statistic RA Percent Paced: 25 %
Brady Statistic RV Percent Paced: 97 %
Date Time Interrogation Session: 20161003104352
Lead Channel Pacing Threshold Amplitude: 0.625 V
Lead Channel Pacing Threshold Amplitude: 0.875 V
Lead Channel Pacing Threshold Amplitude: 1 V
Lead Channel Pacing Threshold Pulse Width: 0.5 ms
Lead Channel Pacing Threshold Pulse Width: 0.5 ms
Lead Channel Pacing Threshold Pulse Width: 0.5 ms
Lead Channel Sensing Intrinsic Amplitude: 5 mV
Lead Channel Sensing Intrinsic Amplitude: 8.5 mV
Pulse Gen Serial Number: 3099689

## 2015-04-15 LAB — BASIC METABOLIC PANEL
BUN: 16 mg/dL (ref 6–23)
CO2: 29 mEq/L (ref 19–32)
Calcium: 9.1 mg/dL (ref 8.4–10.5)
Chloride: 107 mEq/L (ref 96–112)
Creatinine, Ser: 0.85 mg/dL (ref 0.40–1.50)
GFR: 94.77 mL/min (ref >60.00)
Glucose, Bld: 109 mg/dL — ABNORMAL HIGH (ref 70–99)
Potassium: 4.1 mEq/L (ref 3.5–5.1)
Sodium: 143 mEq/L (ref 135–145)

## 2015-04-15 NOTE — Patient Instructions (Addendum)
Medication Instructions:  Your physician recommends that you continue on your current medications as directed. Please refer to the Current Medication list given to you today.  Labwork: Your physician recommends that you have lab work today. BMET   Testing/Procedures: Your physician has requested that you have an ankle brachial index (ABI). During this test an ultrasound and blood pressure cuff are used to evaluate the arteries that supply the arms and legs with blood. Allow thirty minutes for this exam. There are no restrictions or special instructions.   Your physician has requested that you have an echocardiogram 08/2015.  Echocardiography is a painless test that uses sound waves to create images of your heart. It provides your doctor with information about the size and shape of your heart and how well your heart's chambers and valves are working. This procedure takes approximately one hour. There are no restrictions for this procedure.   Follow-Up: Your physician recommends that you keep your already scheduled appointment with Dr. Rayann Heman. Your physician wants you to follow-up in:6 months with Dr. Harrington Challenger. You will receive a reminder letter in the mail two months in advance. If you don't receive a letter, please call our office to schedule the follow-up appointment.   Any Other Special Instructions Will Be Listed Below (If Applicable).

## 2015-04-19 ENCOUNTER — Ambulatory Visit (HOSPITAL_COMMUNITY)
Admission: RE | Admit: 2015-04-19 | Discharge: 2015-04-19 | Disposition: A | Discharge status: Home / Self Care | Payer: Medicare HMO | Source: Ambulatory Visit | Attending: Cardiovascular Disease | Admitting: Cardiovascular Disease

## 2015-04-19 DIAGNOSIS — I5022 Chronic systolic (congestive) heart failure: Secondary | ICD-10-CM

## 2015-04-19 DIAGNOSIS — I739 Peripheral vascular disease, unspecified: Secondary | ICD-10-CM

## 2015-04-19 DIAGNOSIS — E785 Hyperlipidemia, unspecified: Secondary | ICD-10-CM | POA: Diagnosis not present

## 2015-04-19 DIAGNOSIS — I441 Atrioventricular block, second degree: Secondary | ICD-10-CM | POA: Diagnosis not present

## 2015-04-19 DIAGNOSIS — I10 Essential (primary) hypertension: Secondary | ICD-10-CM | POA: Diagnosis not present

## 2015-04-19 DIAGNOSIS — E119 Type 2 diabetes mellitus without complications: Secondary | ICD-10-CM | POA: Diagnosis not present

## 2015-04-23 DIAGNOSIS — G4733 Obstructive sleep apnea (adult) (pediatric): Secondary | ICD-10-CM | POA: Diagnosis not present

## 2015-04-29 ENCOUNTER — Telehealth: Payer: Self-pay

## 2015-04-29 NOTE — Telephone Encounter (Signed)
Attempted patient call for ICM intro and enrollment.  Referred by Dr Rayann Heman.  No answer and no answering machine

## 2015-04-30 ENCOUNTER — Telehealth: Payer: Self-pay | Admitting: *Deleted

## 2015-04-30 NOTE — Telephone Encounter (Signed)
-----   Message from Patsey Berthold, NP sent at 04/19/2015  7:26 PM EDT ----- Please notify patient of normal ABI's

## 2015-05-02 ENCOUNTER — Telehealth: Payer: Self-pay | Admitting: Family Medicine

## 2015-05-02 NOTE — Telephone Encounter (Signed)
Pt called and left message he wanted copy of July lab.  Mailed same to pt.

## 2015-05-24 DIAGNOSIS — G4733 Obstructive sleep apnea (adult) (pediatric): Secondary | ICD-10-CM | POA: Diagnosis not present

## 2015-05-24 NOTE — Telephone Encounter (Signed)
Attempted patient call for ICM intro and enrollment.  Referred by Dr Allred.  No answer and no answering machine  

## 2015-06-03 ENCOUNTER — Encounter: Payer: Medicare HMO | Admitting: Internal Medicine

## 2015-06-12 ENCOUNTER — Ambulatory Visit (INDEPENDENT_AMBULATORY_CARE_PROVIDER_SITE_OTHER): Payer: Medicare HMO | Admitting: Internal Medicine

## 2015-06-12 ENCOUNTER — Encounter: Payer: Self-pay | Admitting: Internal Medicine

## 2015-06-12 VITALS — BP 130/70 | HR 64 | Ht 73.0 in | Wt 211.2 lb

## 2015-06-12 DIAGNOSIS — I441 Atrioventricular block, second degree: Secondary | ICD-10-CM

## 2015-06-12 DIAGNOSIS — Z950 Presence of cardiac pacemaker: Secondary | ICD-10-CM

## 2015-06-12 DIAGNOSIS — I428 Other cardiomyopathies: Secondary | ICD-10-CM

## 2015-06-12 DIAGNOSIS — I429 Cardiomyopathy, unspecified: Secondary | ICD-10-CM

## 2015-06-12 DIAGNOSIS — I10 Essential (primary) hypertension: Secondary | ICD-10-CM

## 2015-06-12 DIAGNOSIS — I447 Left bundle-branch block, unspecified: Secondary | ICD-10-CM | POA: Diagnosis not present

## 2015-06-12 LAB — CUP PACEART INCLINIC DEVICE CHECK
Date Time Interrogation Session: 20161130140251
Implantable Lead Implant Date: 20151220
Implantable Lead Implant Date: 20151220
Implantable Lead Implant Date: 20160819
Implantable Lead Location: 753858
Implantable Lead Location: 753859
Implantable Lead Location: 753860
Implantable Lead Model: 5076
Implantable Lead Model: 5076
Pulse Gen Serial Number: 3099689

## 2015-06-12 NOTE — Patient Instructions (Addendum)
Medication Instructions:  Your physician recommends that you continue on your current medications as directed. Please refer to the Current Medication list given to you today.   Labwork: None ordered   Testing/Procedures: None ordered   Follow-Up: Your physician wants you to follow-up in: 12 months with Alexander Marshall, NP You will receive a reminder letter in the mail two months in advance. If you don't receive a letter, please call our office to schedule the follow-up appointment.   Remote monitoring is used to monitor your Pacemaker of ICD from home. This monitoring reduces the number of office visits required to check your device to one time per year. It allows [place] to keep an eye on the functioning of your device to ensure it is working properly. You are scheduled for a device check from home on 09/11/15. You may send your transmission at any time that day. If you have a wireless device, the transmission will be sent automatically. After your physician reviews your transmission, you will receive a postcard with your next transmission date.    Any Other Special Instructions Will Be Listed Below (If Applicable).     If you need a refill on your cardiac medications before your next appointment, please call your pharmacy.

## 2015-06-13 NOTE — Progress Notes (Signed)
Electrophysiology Office Note   Date:  06/13/2015   ID:  Alexander Curtis, Alexander Curtis 09-Mar-1946, MRN [id number]  PCP:  Vikki Ports, MD  Cardiologist:  Dr Harrington Challenger  Primary Electrophysiologist: [name], MD    Chief Complaint  Patient presents with  . Bradycardia  . 2nd degree Mobitz II heart block  . NICM     History of Present Illness: Alexander Curtis is a 69 y.o. male who presents today for electrophysiology evaluation.  He has done very well since recent upgrade to CRT.  Exercise tolerance is much improved.  He is very pleased with his results.  Today, he denies symptoms of palpitations, chest pain, shortness of breath, orthopnea, PND, lower extremity edema, claudication, dizziness, presyncope, syncope, bleeding, or neurologic sequela. The patient is tolerating medications without difficulties and is otherwise without complaint today.    Past Medical History  Diagnosis Date  . HLD (hyperlipidemia)   . Syncope 2011    a. 2011 -negative workup except LBBB. thought to be vasovagal  . LBBB (left bundle branch block)   . Hypertension 05/2013  . Obstructive sleep apnea     a. not compliant with CPAP  . Atrial enlargement, left   . Mitral regurgitation   . Tricuspid regurgitation   . Mobitz type 2 second degree heart block     a. s/p Medtronic Adapta L model ADDRL 1 (serial number [id number]) pacemaker. 07/02/14 b.  upgrade to STJ CRTP 02/2015  . Melanoma ([place]) 1968    R arm  . Diabetes mellitus, type 2 ([place])   . Non-ischemic cardiomyopathy ([place])     a. felt to be 2/2 RV pacing   Past Surgical History  Procedure Laterality Date  . Knee arthroscopy  02/2011    L knee for meniscal tear. [name]  . Inguinal hernia repair      bilateral  . Finger tendon repair      R 2nd and 3rd finger  . Uvulectomy      laser treatment for sleep apnea (NOT UP3)  . Vasectomy    . Permanent pacemaker insertion N/A 07/01/2014    MDT ADDRL1 pacemaker implanted for Mobitz II Dr Rayann Heman  . Ep  implantable device N/A 03/01/2015    STJ CRTP upgrade by Dr Rayann Heman     Current Outpatient Prescriptions  Medication Sig Dispense Refill  . aspirin 81 MG tablet Take 81 mg by mouth every other day.    Marland Kitchen atorvastatin (LIPITOR) 10 MG tablet Take 10 mg by mouth every other day.    . carvedilol (COREG) 6.25 MG tablet Take 1 tablet (6.25 mg total) by mouth 2 (two) times daily with a meal. 60 tablet 3  . CINNAMON PO Take 2,000 mg by mouth 3 (three) times daily.     . Coenzyme Q10 (COQ10) 200 MG CAPS Take 1 capsule by mouth 2 (two) times daily.     . Cyanocobalamin (VITAMIN B-12 PO) Take 1 capsule by mouth daily.    . eszopiclone (LUNESTA) 2 MG TABS tablet Take 1 tablet (2 mg total) by mouth at bedtime as needed for sleep. Take immediately before bedtime 15 tablet 0  . fish oil-omega-3 fatty acids 1000 MG capsule Take 1 g by mouth 3 (three) times daily.     Marland Kitchen lisinopril (PRINIVIL,ZESTRIL) 10 MG tablet Take 1 tablet (10 mg total) by mouth daily. 30 tablet 11  . metFORMIN (GLUCOPHAGE-XR) 500 MG 24 hr tablet Take 1 tablet (500 mg total) by mouth  daily with breakfast. 30 tablet 5  . Multiple Vitamins-Minerals (MENS 50+ MULTI VITAMIN/MIN PO) Take 1 tablet by mouth daily.    . vitamin C (ASCORBIC ACID) 500 MG tablet Take 500 mg by mouth daily.     No current facility-administered medications for this visit.   Facility-Administered Medications Ordered in Other Visits  Medication Dose Route Frequency Provider Last Rate Last Dose  . influenza  inactive virus vaccine (FLUZONE/FLUARIX) injection 0.5 mL  0.5 mL Intramuscular Once Rita Ohara, MD        Allergies:   Horse-derived products; Pravastatin; and Adhesive   Social History:  The patient  reports that he has never smoked. He has never used smokeless tobacco. He reports that he drinks alcohol. He reports that he does not use illicit drugs.   Family History:  The patient's family history includes Cancer in his mother; Dementia in his mother and paternal  aunt; Diabetes in his maternal grandmother and sister; Heart disease in his maternal grandfather; Melanoma (age of onset: 56) in his mother; Stroke (age of onset: 29) in his father.    ROS:  Please see the history of present illness.   All other systems are reviewed and negative.    PHYSICAL EXAM: VS:  BP 130/70 mmHg  Pulse 64  Ht 6\' 1"  (1.854 m)  Wt 211 lb 3.2 oz (95.8 kg)  BMI 27.87 kg/m2 , BMI Body mass index is 27.87 kg/(m^2). GEN: Well nourished, well developed, in no acute distress HEENT: normal Neck: no JVD, carotid bruits, or masses Cardiac: RRR; 2/6 SEM LLSB, 2/6 SEM at the apex Respiratory:  clear to auscultation bilaterally, normal work of breathing GI: soft, nontender, nondistended, + BS MS: no deformity or atrophy Skin: warm and dry, device pocket is well healed Neuro:  Strength and sensation are intact Psych: euthymic mood, full affect  Device interrogation is reviewed today in detail.  See PaceArt for details.   Recent Labs: 06/28/2014: TSH 1.405 06/29/2014: Magnesium 1.9 06/30/2014: Pro B Natriuretic peptide (BNP) 86.6 01/28/2015: ALT 24 02/22/2015: Hemoglobin 13.4; Platelets 148.0* 04/15/2015: BUN 16; Creatinine, Ser 0.85; Potassium 4.1; Sodium 143    Lipid Panel     Component Value Date/Time   CHOL 135 07/16/2014 0934   TRIG 54 07/16/2014 0934   HDL 55 07/16/2014 0934   CHOLHDL 2.5 07/16/2014 0934   VLDL 11 07/16/2014 0934   LDLCALC 69 07/16/2014 0934     Wt Readings from Last 3 Encounters:  06/12/15 211 lb 3.2 oz (95.8 kg)  04/15/15 213 lb (96.616 kg)  03/02/15 206 lb 9.1 oz (93.7 kg)    ASSESSMENT AND PLAN:  1. Mobitz II second degree AV block Normal BiV pacemaker function See Pace Art report I have turned multipoint pacing off today and this has improved battery longevity by 3 years.  If he has decline in exercise tolerance then we could consider turning this back on in the future. Follow-up echo in next few months to assess response to  CRT.  2. OSA/ severe LAE Compliance with CPAP is encouraged  3. Nonischemic CM/ acute systolic dysfunction Clinically improved  As above  4. HTN Stable No change required today  5. MR/TR Stable No change required today  Current medicines are reviewed at length with the patient today.   The patient does not have concerns regarding his medicines.  The following changes were made today:  none  Labs/ tests ordered today include:  Orders Placed This Encounter  Procedures  . Implantable device  check    Signed, [name], MD    [address] [place] [address] [phone] (office) [phone] (fax)

## 2015-06-19 DIAGNOSIS — G4733 Obstructive sleep apnea (adult) (pediatric): Secondary | ICD-10-CM | POA: Diagnosis not present

## 2015-06-23 DIAGNOSIS — G4733 Obstructive sleep apnea (adult) (pediatric): Secondary | ICD-10-CM | POA: Diagnosis not present

## 2015-07-16 NOTE — Telephone Encounter (Signed)
Attempted patient call for ICM intro and enrollment. Referred by Dr Rayann Heman. No answer.  Unable to reach member to enroll in [facility] clinic at this time.

## 2015-07-17 ENCOUNTER — Ambulatory Visit: Payer: Medicare HMO

## 2015-07-17 ENCOUNTER — Other Ambulatory Visit: Payer: Self-pay | Admitting: Internal Medicine

## 2015-07-17 DIAGNOSIS — I5022 Chronic systolic (congestive) heart failure: Secondary | ICD-10-CM

## 2015-07-17 MED ORDER — FUROSEMIDE 20 MG PO TABS: 20.0000 mg | ORAL_TABLET | ORAL | Status: DC

## 2015-07-17 NOTE — Telephone Encounter (Signed)
Patient returned call and agreed to [facility] enrollment.

## 2015-07-17 NOTE — Progress Notes (Signed)
EPIC Encounter for ICM Monitoring  Patient Name: Alexander Curtis is a 70 y.o. male Date: 07/17/2015 Primary Care Physican: Vikki Ports, MD Primary Cardiologist: Harrington Challenger Electrophysiologist: Allred Dry Weight: 213 lb   Bi-V Pacing 98%      In the past month, have you:  1. Gained more than 2 pounds in a day or more than 5 pounds in a week? Yes.  Patient gained 7 lbs during a cruise from 05/13/2015 to 06/05/2015 and weight went from 204 to 211 lbs. He reported the weight dropped to 207 lbs until his last vacation on 07/09/2015.  He reported since, 07/09/2015, he has gained 6 lbs and current weight is 213 lbs.     2. Had changes in your medications (with verification of current medications)? no  3. Had more shortness of breath than is usual for you? no  4. Limited your activity because of shortness of breath? no  5. Not been able to sleep because of shortness of breath? no  6. Had increased swelling in your feet or ankles? no  7. Had symptoms of dehydration (dizziness, dry mouth, increased thirst, decreased urine output) no  8. Had changes in sodium restriction? no  9. Been compliant with medication? Yes   ICM trend: 07/17/2015 - 3 month view   ICM Trend: 1 year view - 07/17/2015   Follow-up plan: ICM clinic phone appointment 07/24/2015.  1st ICM encounter and provided ICM introduction and he agreed to monthly calls.  He reported he had weight gain and requested he send transmission today.  Corvue impedance below baseline 06/21/2015 to 06/21/2015 and 07/10/2015 to 07/10/2015 to 07/17/2015 suggesting fluid retention which correlates to his weight gain.  He denied other symptoms.  He reported he knows the weight gain has come from the holidays and being on vacation. He reported he is a diabetic so the weight gain concerns him and he will be getting back on his diet.  He currently is not on a diuretic.  He stated he is feeling fine today.   Patient gives verbal permission to speak with wife regarding  his health (she is an Therapist, sports) and may leave detailed message on mobile or home phone.  Will mail DPR form.    Advised will review transmission with Dr Rayann Heman today in the office for any recommendations.   Dr Rayann Heman ordered Lasix 20 mg (1 tablet) daily x 5 days.   Repeat transmission on 07/24/2015. Patient notified of Dr Bonita Quin order.  Education given on Hypokalemia symptoms to report.  Advised if he has trouble taking the medication to call back.     Copy of note sent to patient's primary care physician, primary cardiologist, and device following physician.  [name], RN, CCM 07/17/2015 11:57 AM

## 2015-07-18 ENCOUNTER — Telehealth: Payer: Self-pay

## 2015-07-18 NOTE — Telephone Encounter (Signed)
Call back to patient and advised [facility] is filling the script and explained the reason why it was not filled.  He stated it the med becomes a long term med, than he will have filled at [place] if needed.    Spoke with Ronalee Belts at [facility] and he stated the system has flagged that patient only wants 90 day supply of meds. I stated he only needs 30 day since is prn med and he stated they would fill the script.   Received call from patient.  He reported his weight is down 3 pounds this morning and he is not sure if he wants to take the Furosemide that was recommended for 5 days by Dr Rayann Heman.  I advised that he feels he does not need the prn dosage that he does not have to take it and would leave it at his discretion.  Will repeat ICM transmission next week.  He stated the pharmacy was unable to fill the script and need me to contact the pharmacy.

## 2015-07-24 DIAGNOSIS — G4733 Obstructive sleep apnea (adult) (pediatric): Secondary | ICD-10-CM | POA: Diagnosis not present

## 2015-07-25 ENCOUNTER — Telehealth: Payer: Self-pay | Admitting: Family Medicine

## 2015-07-25 ENCOUNTER — Telehealth: Payer: Self-pay

## 2015-07-25 DIAGNOSIS — R69 Illness, unspecified: Secondary | ICD-10-CM | POA: Diagnosis not present

## 2015-07-25 NOTE — Telephone Encounter (Signed)
Done

## 2015-07-25 NOTE — Telephone Encounter (Signed)
That is fine 

## 2015-07-25 NOTE — Telephone Encounter (Signed)
Pt went to get a refill on Metformin at [facility] on [address] [facility] and they said they couldn't do that anymore so he went to the [place]. To get it at the [place] he needs a written prescription for the med and his last labs showing sugar levels and A1c. His Dr. At the [place] is Dr. Enedina Finner

## 2015-07-25 NOTE — Telephone Encounter (Signed)
Pt left a message on my voice mail that he needed his labs and rx faxed to the [place].  He states [facility] is no longer giving him his meds for free and [facility] who wrote it originally to forward them a copy.  He then said have [place] give me a call, he hung up the phone.

## 2015-07-25 NOTE — Telephone Encounter (Signed)
Called patient and he states that [facility] no longer does his metformin for free (I also called [facility] and this is correct and they are faxing [place] a new list). So the [place] will provide him with the medication if we can fax his last A1C (from July 2016), which he states is fine along with a med list and OV from when you first rx'd for him. Fax to [phone] Attn: Dr.Anyim. Is this okay? He also has appt here on 08/14/15 , FYI.

## 2015-07-25 NOTE — Telephone Encounter (Signed)
See other message

## 2015-07-30 ENCOUNTER — Ambulatory Visit (INDEPENDENT_AMBULATORY_CARE_PROVIDER_SITE_OTHER): Payer: Medicare HMO

## 2015-07-30 DIAGNOSIS — I5022 Chronic systolic (congestive) heart failure: Secondary | ICD-10-CM | POA: Diagnosis not present

## 2015-07-30 DIAGNOSIS — Z950 Presence of cardiac pacemaker: Secondary | ICD-10-CM

## 2015-07-30 NOTE — Progress Notes (Signed)
EPIC Encounter for ICM Monitoring  Patient Name: Alexander Curtis is a 70 y.o. male Date: 07/30/2015 Primary Care Physican: Vikki Ports, MD Primary Cardiologist: Harrington Challenger Electrophysiologist: Allred Dry Weight: 204 lbs  Bi-V Pacing 98%       In the past month, have you:  1. Gained more than 2 pounds in a day or more than 5 pounds in a week? no  2. Had changes in your medications (with verification of current medications)? no  3. Had more shortness of breath than is usual for you? no  4. Limited your activity because of shortness of breath? no  5. Not been able to sleep because of shortness of breath? no  6. Had increased swelling in your feet or ankles? no  7. Had symptoms of dehydration (dizziness, dry mouth, increased thirst, decreased urine output) no  8. Had changes in sodium restriction? no  9. Been compliant with medication? Yes   ICM trend: 3 month view 07/30/2015  ICM trend: 1 year view 07/30/2015  Follow-up plan: ICM clinic phone appointment 08/30/2015.  CORVUE:  Daily thoracic impedance above baseline since taking prescribed Furosemide on 07/17/2015.   Patient reported he took Furosemide 20 mg the first day, took 1/2 tablet for 2 days and has taken 1/2 tablet every other day until today.  Explained the order was for 5 days only and does not need to continue the Furosemide at this time.  He has lost 10 pounds while taking Furosemide.  Instructed to call should he experience any HF symptoms and before any further Furosemide dosages.  DIET: He has resumed low salt healthy diet.   No changes today.  Echo scheduled for 08/16/2015.   Copy of note sent to patient's primary care physician, primary cardiologist, and device following physician.  [name], RN, CCM 07/30/2015 10:56 AM

## 2015-08-14 ENCOUNTER — Ambulatory Visit (INDEPENDENT_AMBULATORY_CARE_PROVIDER_SITE_OTHER): Payer: Medicare HMO | Admitting: Family Medicine

## 2015-08-14 ENCOUNTER — Encounter: Payer: Self-pay | Admitting: Family Medicine

## 2015-08-14 VITALS — BP 130/78 | HR 68 | Ht 72.5 in | Wt 204.8 lb

## 2015-08-14 DIAGNOSIS — E119 Type 2 diabetes mellitus without complications: Secondary | ICD-10-CM | POA: Diagnosis not present

## 2015-08-14 DIAGNOSIS — J069 Acute upper respiratory infection, unspecified: Secondary | ICD-10-CM

## 2015-08-14 DIAGNOSIS — M25511 Pain in right shoulder: Secondary | ICD-10-CM

## 2015-08-14 DIAGNOSIS — I428 Other cardiomyopathies: Secondary | ICD-10-CM

## 2015-08-14 DIAGNOSIS — I429 Cardiomyopathy, unspecified: Secondary | ICD-10-CM | POA: Diagnosis not present

## 2015-08-14 DIAGNOSIS — I441 Atrioventricular block, second degree: Secondary | ICD-10-CM | POA: Diagnosis not present

## 2015-08-14 DIAGNOSIS — I10 Essential (primary) hypertension: Secondary | ICD-10-CM

## 2015-08-14 DIAGNOSIS — E7800 Pure hypercholesterolemia, unspecified: Secondary | ICD-10-CM | POA: Diagnosis not present

## 2015-08-14 DIAGNOSIS — Z23 Encounter for immunization: Secondary | ICD-10-CM

## 2015-08-14 DIAGNOSIS — Z0000 Encounter for general adult medical examination without abnormal findings: Secondary | ICD-10-CM | POA: Diagnosis not present

## 2015-08-14 LAB — POCT URINALYSIS DIPSTICK
Bilirubin, UA: NEGATIVE
Blood, UA: NEGATIVE
Glucose, UA: NEGATIVE
Ketones, UA: NEGATIVE
Leukocytes, UA: NEGATIVE
Nitrite, UA: NEGATIVE
Protein, UA: NEGATIVE
Spec Grav, UA: >=1.03
Urobilinogen, UA: NEGATIVE
pH, UA: 6

## 2015-08-14 NOTE — Progress Notes (Signed)
Chief Complaint  Patient presents with  . Medicare Wellness    fasting med check plus/AWV. Had eye exam at [facility] last month. Did mention that he has had 3 "colds," sinus infection type since November. Right shoulder has been bothering him more lately, can't even sleep on right side.   . [facility]    [facility]    Alexander Curtis is a 70 y.o. male who presents for complete physical, annual wellness visit and follow-up on chronic medical conditions.  He is seen by the [facility] now and gets all prescriptions through them.  He reports having had a physical there in September, and recent labs last month for his diabetes (he doesn't have results yet).  None of his labs from the [place] have been received.  He is complaining of right shoulder pain, worse in the last 2 months.  Has pain to raise his right arm to 90 degrees. He has h/o injury playing baseball, may have had a tear.  Has recurrent pain when throwing a ball, and has recurrent pain now with raking, lifting grandchildren, and if dog pulls when holding leash on the right. He has pain sleeping on the right side.  Ibuprofen is used just prn (600mg ), has tried taking it at night, but it didn't help him sleep.  Hyperlipidemia: Patient has been taking lipitor regularly. He has some soreness in his upper arms/shoulders, tolerable. Worsening of right shoulder pain, as above, that he realizes is NOT related to his statin. He is trying to follow a lowfat, low cholesterol diet.  Diabetes--he was started on metformin after his visit in October 2015. Tolerating it without side effects. He plans to get this from the [place], and just had bloodwork done through them, in order to get metformin from the [place].  Blood sugars are usually 87-108 in the mornings. Later in the day the sugar runs around the same values. Denies polydipsia, polyuria, hypoglycemia; diabetic eye exam is UTD, done at [place].  Bradycardia (Mobitz type 2 second degree heart block)--s/p pacemaker  insertion 06/2014, then upgraded to CRT-P device in 02/2015. He gets regular pacemaker checks, and is working well. No longer is having any fatigue or dyspnea on exertion.  CHF/cardiomyopathy: he had gained weight on cruise.  He was put on lasix, but lost 10# after just 5 days, so it was stopped.  He is scheduled for echo later this week to re-assess EF. He denies edema, dyspnea, chest pain.  Hypertension:  BP's at home have been 120's-133/70's, pulse 65-68. No headaches, dizziness, side effects to medication.  Sleep apnea--He had another sleep study done through Dr. Erik Obey, and was fitted for a new mask.  He has been using CPAP and tolerating well without problems. He has trouble when he has a cold. He took his CPAP on vacation with him. He doesn't feel a significant difference.  He feels refreshed in the morning, but has some fatigue develop after lunch. He thinks this might be related to what he eats.    Immunization History  Administered Date(s) Administered  . Influenza Split 05/02/2012  . Influenza, High Dose Seasonal PF 04/12/2014  . Influenza-Unspecified 03/28/2015  . Pneumococcal Polysaccharide-23 03/28/2012  . Zoster 12/31/2011  allergic to tetanus  Last colonoscopy:  At the [place] 8-10 years ago, per pt.  He was told by PCP that he will be due soon, not yet due. Reportedly normal (no results available to me) Last PSA: 07/2014 here.  [facility], and had labs done then. Pt reports no prostate exam done because PSA was okay, per pt's report. Dentist: twice yearly Ophtho: yearly (had recently at [facility]) Exercise:  Walks dog daily (15-20 minutes or longer), some walking with his wife. No weight-bearing exercise.  Other doctors caring for patient include: Cardiology: Dr. Harrington Challenger, Dr. Rayann Heman Ophtho: at [facility] GI: at [facility] PCP Dr. Lavonna Monarch Dentist: can't recall name, in [facility] office complex Dermatologist: at [facility], every 6 months Ortho:  [name]  Depression screen:   Negative Fall screen: negative Functional status screen--notable for decreased vision from cataracts, decreased hearing (test scheduled at [facility] this month), forgetting where he puts his keys and glasses, but no other memory concerns.  See full screenin epic.  End of Life Discussion:  Patient does not have a living will and medical power of attorney  Past Medical History  Diagnosis Date  . HLD (hyperlipidemia)   . Syncope 2011    a. 2011 -negative workup except LBBB. thought to be vasovagal  . LBBB (left bundle branch block)   . Hypertension 05/2013  . Obstructive sleep apnea     a. not compliant with CPAP  . Atrial enlargement, left   . Mitral regurgitation   . Tricuspid regurgitation   . Mobitz type 2 second degree heart block     a. s/p Medtronic Adapta L model ADDRL 1 (serial number [id number]) pacemaker. 07/02/14 b.  upgrade to STJ CRTP 02/2015  . Melanoma ([place]) 1968    R arm  . Diabetes mellitus, type 2 ([place])   . Non-ischemic cardiomyopathy ([place])     a. felt to be 2/2 RV pacing    Past Surgical History  Procedure Laterality Date  . Knee arthroscopy  02/2011    L knee for meniscal tear. [name]  . Inguinal hernia repair      bilateral  . Finger tendon repair      R 2nd and 3rd finger  . Uvulectomy      laser treatment for sleep apnea (NOT UP3)  . Vasectomy    . Permanent pacemaker insertion N/A 07/01/2014    MDT ADDRL1 pacemaker implanted for Mobitz II Dr Rayann Heman  . Ep implantable device N/A 03/01/2015    STJ CRTP upgrade by Dr Rayann Heman    Social History   Social History  . Marital Status: Married    Spouse Name: N/A  . Number of Children: 4  . Years of Education: N/A   Occupational History  . retired    Social History Main Topics  . Smoking status: Never Smoker   . Smokeless tobacco: Never Used  . Alcohol Use: Yes     Comment: 3-4 drinks per month.  . Drug Use: No  . Sexual Activity: Not on file   Other Topics Concern  . Not on file   Social  History Narrative   Lives with wife, cat.  Daughter, Junie Panning, in [place], 2 children in [place], son in [place].   10 grandchildren   Retired school principal       Family History  Problem Relation Age of Onset  . Dementia Mother   . Cancer Mother     ?type, was told it contributed to death, per pt  . Melanoma Mother 72  . Stroke Father 16    cerebral aneurysm  . Diabetes Sister     borderline, obese  . Dementia Paternal Aunt   . Diabetes Maternal Grandmother   . Heart disease  Maternal Grandfather   . Parkinson's disease Maternal Aunt     Outpatient Encounter Prescriptions as of 08/14/2015  Medication Sig Note  . aspirin 81 MG tablet Take 81 mg by mouth every other day.   Marland Kitchen atorvastatin (LIPITOR) 10 MG tablet Take 10 mg by mouth every other day.   . carvedilol (COREG) 6.25 MG tablet Take 1 tablet (6.25 mg total) by mouth 2 (two) times daily with a meal.   . CINNAMON PO Take 2,000 mg by mouth 3 (three) times daily.    . Coenzyme Q10 (COQ10) 200 MG CAPS Take 1 capsule by mouth 2 (two) times daily.    . Cyanocobalamin (VITAMIN B-12 PO) Take 1 capsule by mouth daily.   . fish oil-omega-3 fatty acids 1000 MG capsule Take 1 g by mouth 3 (three) times daily.    Marland Kitchen lisinopril (PRINIVIL,ZESTRIL) 10 MG tablet Take 1 tablet (10 mg total) by mouth daily.   . metFORMIN (GLUCOPHAGE-XR) 500 MG 24 hr tablet Take 1 tablet (500 mg total) by mouth daily with breakfast.   . Multiple Vitamins-Minerals (MENS 50+ MULTI VITAMIN/MIN PO) Take 1 tablet by mouth daily.   . vitamin C (ASCORBIC ACID) 500 MG tablet Take 500 mg by mouth daily.   . eszopiclone (LUNESTA) 2 MG TABS tablet Take 1 tablet (2 mg total) by mouth at bedtime as needed for sleep. Take immediately before bedtime (Patient not taking: Reported on 08/14/2015) 02/28/2015: Only if traveling  . furosemide (LASIX) 20 MG tablet Take 1 tablet (20 mg total) by mouth as directed. (Patient not taking: Reported on 08/14/2015) 08/14/2015: Only uses prn, per cardiologist    Facility-Administered Encounter Medications as of 08/14/2015  Medication  . influenza  inactive virus vaccine (FLUZONE/FLUARIX) injection 0.5 mL    Allergies  Allergen Reactions  . Horse-Derived Products Anaphylaxis    REACTION: Anaphlactic Reaction.  Can't take tetanus shot  . Pravastatin Other (See Comments)    Joint pain   . Adhesive [Tape] Itching and Rash    ROS: The patient denies anorexia, fever, weight changes (except as noted in HPI), headaches,  ear pain, hoarseness, chest pain, palpitations, dizziness, syncope, dyspnea on exertion, cough, swelling, nausea, vomiting, diarrhea, constipation, abdominal pain, melena, hematochezia, indigestion/heartburn, hematuria, incontinence, erectile dysfunction, nocturia, weakened urine stream, dysuria, genital lesions, joint pains, numbness, tingling, weakness, tremor, suspicious skin lesions, depression, anxiety, abnormal bleeding/bruising, or enlarged lymph nodes. Denies edema. +congestion/cold symptoms that recently started. No fever, chills, sinus pain or discolored mucus.  Denies fatigue. Slight constipation, relieved by daily fiber tablet. See HPI  PHYSICAL EXAM:  BP 130/78 mmHg  Pulse 68  Ht 6' 0.5" (1.842 m)  Wt 204 lb 12.8 oz (92.897 kg)  BMI 27.38 kg/m2  General Appearance:    Alert, cooperative, no distress, appears stated age  Head:    Normocephalic, without obvious abnormality, atraumatic  Eyes:    PERRL, conjunctiva/corneas clear, EOM's intact, fundi    benign  Ears:    Normal TM's and external ear canals  Nose:   Nares normal, mucosa is mildly edematous, no erythema or purulence, no drainage or sinus tenderness  Throat:   Lips, mucosa, and tongue normal; teeth and gums normal  Neck:   Supple, no lymphadenopathy;  thyroid:  no   enlargement/tenderness/nodules; no carotid   bruit or JVD  Back:    Spine nontender, no curvature, ROM normal, no CVA     tenderness  Lungs:     Clear to auscultation bilaterally without  wheezes, rales  or     ronchi; respirations unlabored  Chest Wall:    No tenderness or deformity   Heart:    Regular rate and rhythm, S1 and S2 normal, no murmur, rub   or gallop  Breast Exam:    No chest wall tenderness, masses or gynecomastia  Abdomen:     Soft, non-tender, nondistended, normoactive bowel sounds,    no masses, no hepatosplenomegaly  Genitalia:    Normal male external genitalia without lesions.  Testicles without masses.  No inguinal hernias.  Rectal:    Normal sphincter tone, no masses or tenderness; guaiac negative stool.  Prostate smooth, no nodules, not enlarged.  Extremities:   No clubbing, cyanosis or edema  Pulses:   2+ and symmetric all extremities  Skin:   Skin color, texture, turgor normal, no rashes or lesions  Lymph nodes:   Cervical, supraclavicular, and axillary nodes normal  Neurologic:   CNII-XII intact, normal strength, sensation and gait; reflexes 2+ and symmetric throughout          Psych:   Normal mood, affect, hygiene and grooming.    Right shoulder: Painful arc (abduction) on right. Decreased ROM with internal rotation. Normal strength. Mild impingement.  ASSESSMENT/PLAN:  Annual physical exam  Medicare annual wellness visit, subsequent - Plan: POCT Urinalysis Dipstick  Immunization due - Plan: Pneumococcal conjugate vaccine 13-valent  Right shoulder pain - discussed NSAIDs, ROM exercises.  f/u with ortho if persists/worsens  Pure hypercholesterolemia - continue lipitor.  Pt to get [place] copies of [facility] labs  Essential hypertension, benign - controlled  Controlled type 2 diabetes mellitus without complication, without long-term current use of insulin ([place]) - controlled; continue Metformin.  To get copies of labs from [place], and diabetic eye exam  Mobitz type 2 second degree heart block  Nonischemic cardiomyopathy ([place]) - stable  Acute upper respiratory infection   Discussed PSA screening (risks/benefits), recommended at least 30 minutes of  aerobic activity at least 5 days/week; proper sunscreen use reviewed; healthy diet and alcohol recommendations (less than or equal to 2 drinks/day) reviewed; regular seatbelt use; changing batteries in smoke detectors. Immunization recommendations discussed--Prevnar-13 today. Allergic to Tetanus. Colonoscopy recommendations reviewed, UTD per pt, due soon at [place].  Take ibuprofen 600-800mg  three times daily with food (use the lower dose if it bothers your stomach).  Take it regularly for 7-10 days.  If not any better, see [name].   Do range of motion exercises as shown.  Please get [place] your [facility] results [facility] the [facility]--recent, and from [place] visit. Also, I'd like a copy of the diabetic eye exam.  Find out if had HepC screen through [facility] Get all labs from [place] No labs done today--await results.  Given paperwork for Living Will and healthcare power of attorney. Full Code, Full Care  Medicare Attestation I have personally reviewed: The patient's medical and social history Their use of alcohol, tobacco or illicit drugs Their current medications and supplements The patient's functional ability including ADLs,fall risks, home safety risks, cognitive, and hearing and visual impairment Diet and physical activities Evidence for depression or mood disorders  The patient's weight, height, and BMI have been recorded in the chart.  I have made referrals, counseling, and provided education to the patient based on review of the above and I have provided the patient with a written personalized care plan for preventive services.     Alontae Chaloux A, MD   08/14/2015

## 2015-08-14 NOTE — Patient Instructions (Addendum)
HEALTH MAINTENANCE RECOMMENDATIONS:  It is recommended that you get at least 30 minutes of aerobic exercise at least 5 days/week (for weight loss, you may need as much as 60-90 minutes). This can be any activity that gets your heart rate up. This can be divided in 10-15 minute intervals if needed, but try and build up your endurance at least once a week.  Weight bearing exercise is also recommended twice weekly.  Eat a healthy diet with lots of vegetables, fruits and fiber.  "Colorful" foods have a lot of vitamins (ie green vegetables, tomatoes, red peppers, etc).  Limit sweet tea, regular sodas and alcoholic beverages, all of which has a lot of calories and sugar.  Up to 2 alcoholic drinks daily may be beneficial for men (unless trying to lose weight, watch sugars).  Drink a lot of water.  Sunscreen of at least SPF 30 should be used on all sun-exposed parts of the skin when outside between the hours of 10 am and 4 pm (not just when at beach or pool, but even with exercise, golf, tennis, and yard work!)  Use a sunscreen that says "broad spectrum" so it covers both UVA and UVB rays, and make sure to reapply every 1-2 hours.  Remember to change the batteries in your smoke detectors when changing your clock times in the spring and fall.  Use your seat belt every time you are in a car, and please drive safely and not be distracted with cell phones and texting while driving.   Alexander Curtis , Thank you for taking time to come for your Medicare Wellness Visit. I appreciate your ongoing commitment to your health goals. Please review the following plan we discussed and let me know if I can assist you in the future.   These are the goals we discussed: Goals    None      This is a list of the screening recommended for you and due dates:  Health Maintenance  Topic Date Due  .  Hepatitis C: One time screening is recommended by Center for Disease Control  (CDC) for  adults born from [birth year] through 1966.    Nov 25, 1945  . Eye exam for diabetics  07/02/1955  . Tetanus Vaccine  07/01/1964  . Colon Cancer Screening  07/02/1995  . Pneumonia vaccines (2 of 2 - PCV13) 03/28/2013  . Flu Shot  02/11/2015  . Hemoglobin A1C  07/31/2015  . Complete foot exam   01/28/2016  . Shingles Vaccine  Completed   [facility] with the [facility] to see if you ever had your hepatitis C screening done. If not, see if they will do, or we can always check it at our office. Please get [place] copies of diabetic eye exam (so that the dates in the computer are accurate) Please be sure that we get copies of colonoscopies, and all other procedures done through the [place].  You report being up to date on colon cancer screening (so ignore above). You did get your flu shot at the [facility]--not due again until next Fall, 2017. Your A1c's are being checked at the [place], please send results of all labwork done there. You are getting [id number] today, then your immunizations are all up to date. You are allergic to tetanus (ignore past due date above).   Right shoulder pain: Take ibuprofen 600-800mg  three times daily with food (use the lower dose if it bothers your stomach).  Take it regularly for 7-10 days.  If not any better, see Dr.  Ninfa Linden.   Do range of motion exercises as shown.

## 2015-08-15 ENCOUNTER — Telehealth: Payer: Self-pay | Admitting: Family Medicine

## 2015-08-15 NOTE — Telephone Encounter (Signed)
Pt dropped off labs from the va that you requested, put them in your folder

## 2015-08-16 ENCOUNTER — Telehealth: Payer: Self-pay | Admitting: *Deleted

## 2015-08-16 ENCOUNTER — Telehealth: Payer: Self-pay

## 2015-08-16 ENCOUNTER — Other Ambulatory Visit: Payer: Self-pay

## 2015-08-16 ENCOUNTER — Ambulatory Visit (HOSPITAL_COMMUNITY): Payer: Medicare HMO | Attending: Cardiology

## 2015-08-16 DIAGNOSIS — I447 Left bundle-branch block, unspecified: Secondary | ICD-10-CM | POA: Diagnosis not present

## 2015-08-16 DIAGNOSIS — I10 Essential (primary) hypertension: Secondary | ICD-10-CM | POA: Diagnosis not present

## 2015-08-16 DIAGNOSIS — I5022 Chronic systolic (congestive) heart failure: Secondary | ICD-10-CM | POA: Insufficient documentation

## 2015-08-16 DIAGNOSIS — G4733 Obstructive sleep apnea (adult) (pediatric): Secondary | ICD-10-CM | POA: Insufficient documentation

## 2015-08-16 DIAGNOSIS — E785 Hyperlipidemia, unspecified: Secondary | ICD-10-CM | POA: Insufficient documentation

## 2015-08-16 DIAGNOSIS — I441 Atrioventricular block, second degree: Secondary | ICD-10-CM | POA: Insufficient documentation

## 2015-08-16 DIAGNOSIS — I351 Nonrheumatic aortic (valve) insufficiency: Secondary | ICD-10-CM | POA: Diagnosis not present

## 2015-08-16 DIAGNOSIS — I517 Cardiomegaly: Secondary | ICD-10-CM | POA: Insufficient documentation

## 2015-08-16 DIAGNOSIS — I5021 Acute systolic (congestive) heart failure: Secondary | ICD-10-CM | POA: Diagnosis present

## 2015-08-16 DIAGNOSIS — I340 Nonrheumatic mitral (valve) insufficiency: Secondary | ICD-10-CM | POA: Insufficient documentation

## 2015-08-16 NOTE — Telephone Encounter (Signed)
Received call from patient asking since there were program changes on his device to save on battery life, it that would need to be changed if the EF from the echo that was done today should be lower than the last.  Consulted with [facility], Device team and advised him that if the EF % changes he will be evaluated by physician before any changes are made.  He asked more about how the fluid is measured by his device and explained the process.  He stated he is feeling fine now.  Next scheduled remote transmission is 08/30/2015.

## 2015-08-16 NOTE — Telephone Encounter (Signed)
-----   Message from Patsey Berthold, NP sent at 08/16/2015 11:29 AM EST ----- Please notify patient of echo results. EF improved post CRT.

## 2015-08-19 NOTE — Telephone Encounter (Signed)
I wanted copies of his September labs (from his physical) also--see if he can get those.  This was just a b-met.

## 2015-08-21 ENCOUNTER — Other Ambulatory Visit: Payer: Self-pay | Admitting: Internal Medicine

## 2015-08-21 NOTE — Telephone Encounter (Signed)
Patient going up to [place] with his SIL on 08/28/15-he will go over to medical records and get a copy.

## 2015-08-22 ENCOUNTER — Other Ambulatory Visit: Payer: Self-pay | Admitting: Internal Medicine

## 2015-08-22 ENCOUNTER — Other Ambulatory Visit: Payer: Self-pay | Admitting: *Deleted

## 2015-08-22 DIAGNOSIS — I5022 Chronic systolic (congestive) heart failure: Secondary | ICD-10-CM

## 2015-08-22 MED ORDER — FUROSEMIDE 20 MG PO TABS: 20.0000 mg | ORAL_TABLET | ORAL | Status: DC

## 2015-08-24 DIAGNOSIS — G4733 Obstructive sleep apnea (adult) (pediatric): Secondary | ICD-10-CM | POA: Diagnosis not present

## 2015-08-28 ENCOUNTER — Encounter: Payer: Self-pay | Admitting: Family Medicine

## 2015-08-28 ENCOUNTER — Telehealth: Payer: Self-pay | Admitting: Family Medicine

## 2015-08-28 NOTE — Telephone Encounter (Signed)
Pt dropped of some labs that he said you requested and a piece of paper stating when his last coloscopy was which states 05-28-2006 put in your folder.

## 2015-08-28 NOTE — Telephone Encounter (Signed)
[facility] labs: b-met from January (I believe we already received these)--no additional labs found.  Visit from November  microalbumin reported as ordered, no results, and no other labs done. Looks like he had reported what we had done here.  Needs to return for fasting labs--glucose, LFT's, A1c, lipid panel, PSA medicare, CBC, urine microalbumin/Cr (since no results found in records)  Dx DM, hypercholesterolemia, med monitoring, prostate cancer screening (for PSA) Please enter future orders and schedule appointment. Please also abstract the date of his colonoscopy (05/28/06, due again 05/2016) as per the note he brought in with the other papers.  Thanks

## 2015-08-29 ENCOUNTER — Encounter: Payer: Self-pay | Admitting: *Deleted

## 2015-08-29 ENCOUNTER — Other Ambulatory Visit: Payer: Self-pay | Admitting: *Deleted

## 2015-08-29 DIAGNOSIS — E7800 Pure hypercholesterolemia, unspecified: Secondary | ICD-10-CM

## 2015-08-29 DIAGNOSIS — Z125 Encounter for screening for malignant neoplasm of prostate: Secondary | ICD-10-CM

## 2015-08-29 DIAGNOSIS — Z5181 Encounter for therapeutic drug level monitoring: Secondary | ICD-10-CM

## 2015-08-29 DIAGNOSIS — E119 Type 2 diabetes mellitus without complications: Secondary | ICD-10-CM

## 2015-08-29 NOTE — Telephone Encounter (Signed)
Future orders entered and appt scheduled for 09/02/15.

## 2015-08-30 ENCOUNTER — Ambulatory Visit (INDEPENDENT_AMBULATORY_CARE_PROVIDER_SITE_OTHER): Payer: Medicare HMO

## 2015-08-30 DIAGNOSIS — Z950 Presence of cardiac pacemaker: Secondary | ICD-10-CM

## 2015-08-30 DIAGNOSIS — I5022 Chronic systolic (congestive) heart failure: Secondary | ICD-10-CM | POA: Diagnosis not present

## 2015-09-01 NOTE — Progress Notes (Signed)
Cardiology Office Note   Date:  09/02/2015   ID:  Alexander Curtis, Alexander Curtis Sep 22, 1945, MRN [id number]  PCP:  Alexander Ports, MD  Cardiologist:   Alexander Carnes, MD  Alexander Grayer, MD   F/U of CHF   And pacer    History of Present Illness: Alexander Curtis is a 70 y.o. male with a history ofconduction dz in past and high degree AV block  He underwent PPM in 2011.   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 aspirin 81 MG tablet Take 81 mg by mouth every other day.    Marland Kitchen atorvastatin (LIPITOR) 10 MG tablet Take 10 mg by mouth every other day.    . carvedilol (COREG) 6.25 MG tablet Take 1 tablet (6.25 mg total) by mouth 2 (two) times daily with a meal. 60 tablet 3  . CINNAMON PO Take 2,000 mg by mouth 3 (three) times daily.     . Coenzyme Q10 (COQ10) 200 MG CAPS Take 1 capsule by mouth 2 (two) times daily.     . Cyanocobalamin (VITAMIN B-12 PO) Take 1 capsule by mouth daily.    . eszopiclone (LUNESTA) 2 MG TABS tablet Take 1 tablet (2 mg total) by mouth at bedtime as needed for sleep. Take immediately before bedtime 15 tablet 0  . fish oil-omega-3 fatty acids 1000 MG capsule Take 1 g by mouth 3 (three) times daily.     . furosemide (LASIX) 20 MG tablet Take 1 tablet (20 mg total) by mouth as directed. 30 tablet 0  . lisinopril (PRINIVIL,ZESTRIL) 10 MG tablet Take 1  tablet (10 mg total) by mouth daily. 30 tablet 11  . metFORMIN (GLUCOPHAGE-XR) 500 MG 24 hr tablet Take 1 tablet (500 mg total) by mouth daily with breakfast. 30 tablet 5  . Multiple Vitamins-Minerals (MENS 50+ MULTI VITAMIN/MIN PO) Take 1 tablet by mouth daily.    . vitamin C (ASCORBIC ACID) 500 MG tablet Take 500 mg by mouth daily.     No current facility-administered medications for this visit.   Facility-Administered Medications Ordered in Other Visits  Medication Dose Route Frequency Provider Last Rate Last Dose  . influenza  inactive virus vaccine (FLUZONE/FLUARIX) injection 0.5 mL  0.5 mL Intramuscular Once Alexander Ohara, MD        Allergies:   Horse-derived products; Pravastatin; and Adhesive   Past Medical History  Diagnosis Date  . HLD (hyperlipidemia)   . Syncope 2011    a. 2011 -negative workup except LBBB. thought to be vasovagal  . LBBB (left bundle branch block)   . Hypertension 05/2013  .  Obstructive sleep apnea     a. not compliant with CPAP  . Atrial enlargement, left   . Mitral regurgitation   . Tricuspid regurgitation   . Mobitz type 2 second degree heart block     a. s/p Medtronic Adapta L model ADDRL 1 (serial number [id number]) pacemaker. 07/02/14 b.  upgrade to STJ CRTP 02/2015  . Melanoma ([place]) 1968    R arm  . Diabetes mellitus, type 2 ([place])   . Non-ischemic cardiomyopathy ([place])     a. felt to be 2/2 RV pacing    Past Surgical History  Procedure Laterality Date  . Knee arthroscopy  02/2011    L knee for meniscal tear. [name]  . Inguinal hernia repair      bilateral  . Finger tendon repair      R 2nd and 3rd finger  . Uvulectomy      laser treatment for sleep apnea (NOT UP3)  . Vasectomy    . Permanent pacemaker insertion N/A 07/01/2014    MDT ADDRL1 pacemaker implanted for Mobitz II Dr Rayann Heman  . Ep implantable device N/A 03/01/2015    STJ CRTP upgrade by Dr Rayann Heman     Social History:  The patient  reports that he has never smoked. He has  never used smokeless tobacco. He reports that he drinks alcohol. He reports that he does not use illicit drugs.   Family History:  The patient's family history includes Cancer in his mother; Dementia in his mother and paternal aunt; Diabetes in his maternal grandmother and sister; Heart attack in his maternal grandfather; Heart disease in his maternal grandfather; Melanoma (age of onset: 17) in his mother; Parkinson's disease in his maternal aunt; Stroke (age of onset: 74) in his father. There is no history of Hypertension.    ROS:  Please see the history of present illness. All other systems are reviewed and  Negative to the above problem except as noted.    PHYSICAL EXAM: VS:  BP 130/80 mmHg  Pulse 64  Ht 6' 0.5" (1.842 m)  Wt 91.989 kg (202 lb 12.8 oz)  BMI 27.11 kg/m2  GEN: Well nourished, well developed, in no acute distress HEENT: normal Neck: no JVD, carotid bruits, or masses Cardiac: RRR; no murmurs, rubs, or gallops,no edema  Respiratory:  clear to auscultation bilaterally, normal work of breathing GI: soft, nontender, nondistended, + BS  No hepatomegaly  MS: no deformity Moving all extremities   Skin: warm and dry, no rash Neuro:  Strength and sensation are intact Psych: euthymic mood, full affect   EKG:  EKG is not ordered today.   Lipid Panel    Component Value Date/Time   CHOL 135 07/16/2014 0934   TRIG 54 07/16/2014 0934   HDL 55 07/16/2014 0934   CHOLHDL 2.5 07/16/2014 0934   VLDL 11 07/16/2014 0934   LDLCALC 69 07/16/2014 0934      Wt Readings from Last 3 Encounters:  09/02/15 91.989 kg (202 lb 12.8 oz)  08/14/15 92.897 kg (204 lb 12.8 oz)  06/12/15 95.8 kg (211 lb 3.2 oz)      ASSESSMENT AND PLAN: 1 Systolic CHF   Felt to be due to RV pacing   EF had been normal prior pacer placment.  Declined Now with BiV pacer  Last echo LVEF has improved.  I have reviewed images from Feb 3/ 2017  It appears to be within normal limits   I would keep on current  regimen for  right now.  Seems to be tolerating, EF improved and BP is good     2. Rhythm  Pt with LBBB and Mobitz Type II second degree AV block  S/P PPM and then upgraded to CRTP in Aug 2016  Continue to follow with Alexander Curtis    3  Lipids  On statin  Will need to be followed     Pt eager to get back to long jump in senior Olympics  Will review with EP   I have reviewed with A Lynnell Jude  OK to train for long jump  Will set f/u in [place] for next winter     Pt and wife contacted again to review     Signed, Alexander Carnes, MD  09/02/2015 9:[address], [place], [place]  [address] Phone: [phone]; Fax: [phone]

## 2015-09-02 ENCOUNTER — Ambulatory Visit (INDEPENDENT_AMBULATORY_CARE_PROVIDER_SITE_OTHER): Payer: Medicare HMO | Admitting: Internal Medicine

## 2015-09-02 ENCOUNTER — Encounter: Payer: Self-pay | Admitting: Internal Medicine

## 2015-09-02 ENCOUNTER — Other Ambulatory Visit: Payer: Medicare HMO

## 2015-09-02 VITALS — BP 130/80 | HR 64 | Ht 72.5 in | Wt 202.8 lb

## 2015-09-02 DIAGNOSIS — Z125 Encounter for screening for malignant neoplasm of prostate: Secondary | ICD-10-CM

## 2015-09-02 DIAGNOSIS — E7800 Pure hypercholesterolemia, unspecified: Secondary | ICD-10-CM

## 2015-09-02 DIAGNOSIS — E119 Type 2 diabetes mellitus without complications: Secondary | ICD-10-CM

## 2015-09-02 DIAGNOSIS — E785 Hyperlipidemia, unspecified: Secondary | ICD-10-CM

## 2015-09-02 DIAGNOSIS — Z5181 Encounter for therapeutic drug level monitoring: Secondary | ICD-10-CM | POA: Diagnosis not present

## 2015-09-02 DIAGNOSIS — R001 Bradycardia, unspecified: Secondary | ICD-10-CM | POA: Diagnosis not present

## 2015-09-02 LAB — CBC WITH DIFFERENTIAL/PLATELET
Basophils Absolute: 0 10*3/uL (ref 0.0–0.1)
Basophils Relative: 0 % (ref 0–1)
Eosinophils Absolute: 0.1 10*3/uL (ref 0.0–0.7)
Eosinophils Relative: 3 % (ref 0–5)
HCT: 40.5 % (ref 39.0–52.0)
Hemoglobin: 13.1 g/dL (ref 13.0–17.0)
Lymphocytes Relative: 25 % (ref 12–46)
Lymphs Abs: 0.8 10*3/uL (ref 0.7–4.0)
MCH: 29.7 pg (ref 26.0–34.0)
MCHC: 32.3 g/dL (ref 30.0–36.0)
MCV: 91.8 fL (ref 78.0–100.0)
MPV: 8.9 fL (ref 8.6–12.4)
Monocytes Absolute: 0.5 10*3/uL (ref 0.1–1.0)
Monocytes Relative: 15 % — ABNORMAL HIGH (ref 3–12)
Neutro Abs: 1.9 10*3/uL (ref 1.7–7.7)
Neutrophils Relative %: 57 % (ref 43–77)
Platelets: 136 10*3/uL — ABNORMAL LOW (ref 150–400)
RBC: 4.41 MIL/uL (ref 4.22–5.81)
RDW: 14.5 % (ref 11.5–15.5)
WBC: 3.3 10*3/uL — ABNORMAL LOW (ref 4.0–10.5)

## 2015-09-02 LAB — HEPATIC FUNCTION PANEL
ALT: 26 U/L (ref 9–46)
AST: 30 U/L (ref 10–35)
Albumin: 4.1 g/dL (ref 3.6–5.1)
Alkaline Phosphatase: 51 U/L (ref 40–115)
Bilirubin, Direct: 0.1 mg/dL (ref <=0.2)
Indirect Bilirubin: 0.3 mg/dL (ref 0.2–1.2)
Total Bilirubin: 0.4 mg/dL (ref 0.2–1.2)
Total Protein: 6.2 g/dL (ref 6.1–8.1)

## 2015-09-02 LAB — LIPID PANEL
Cholesterol: 127 mg/dL (ref 125–200)
HDL: 40 mg/dL (ref >=40)
LDL Cholesterol: 74 mg/dL (ref <130)
Total CHOL/HDL Ratio: 3.2 Ratio (ref <=5.0)
Triglycerides: 65 mg/dL (ref <150)
VLDL: 13 mg/dL (ref <30)

## 2015-09-02 LAB — GLUCOSE, RANDOM: Glucose, Bld: 112 mg/dL — ABNORMAL HIGH (ref 65–99)

## 2015-09-02 NOTE — Progress Notes (Signed)
EPIC Encounter for ICM Monitoring  Patient Name: Alexander Curtis is a 70 y.o. male Date: 09/02/2015 Primary Care Physican: Vikki Ports, MD Primary Cardiologist: Harrington Challenger Electrophysiologist: Allred Dry Weight: 199 lbs  Bi-V Pacing 98%       In the past month, have you:  1. Gained more than 2 pounds in a day or more than 5 pounds in a week? no  2. Had changes in your medications (with verification of current medications)? no  3. Had more shortness of breath than is usual for you? no  4. Limited your activity because of shortness of breath? no  5. Not been able to sleep because of shortness of breath? no  6. Had increased swelling in your feet or ankles? no  7. Had symptoms of dehydration (dizziness, dry mouth, increased thirst, decreased urine output) no  8. Had changes in sodium restriction? no  9. Been compliant with medication? Yes   ICM trend: 3 month view for 08/30/2015   ICM trend: 1 year view for 08/30/2015   Follow-up plan: ICM clinic phone appointment 10/03/2015.  Reviewed ICM transmission during patient's office visit with Dr Harrington Challenger.  Corvue thoracic impedance trending along reference line suggesting stable fluid levels.  He stated he is feeling well and denied any fluid symptoms.  No changes today. Encouraged to call for any fluid symptoms.    Copy of note sent to patient's primary care physician, primary cardiologist, and device following physician.  [name], RN, CCM 09/02/2015 10:07 AM

## 2015-09-03 ENCOUNTER — Encounter: Payer: Self-pay | Admitting: Family Medicine

## 2015-09-03 LAB — MICROALBUMIN / CREATININE URINE RATIO
Creatinine, Urine: 186 mg/dL (ref 20–370)
Microalb Creat Ratio: 6 mcg/mg creat (ref <30)
Microalb, Ur: 1.1 mg/dL

## 2015-09-03 LAB — HEMOGLOBIN A1C
Hgb A1c MFr Bld: 6.2 % — ABNORMAL HIGH (ref <5.7)
Mean Plasma Glucose: 131 mg/dL — ABNORMAL HIGH (ref <117)

## 2015-09-03 LAB — PSA, MEDICARE: PSA: 0.48 ng/mL (ref <=4.00)

## 2015-09-13 ENCOUNTER — Ambulatory Visit: Payer: Medicare HMO | Admitting: Internal Medicine

## 2015-09-21 DIAGNOSIS — G4733 Obstructive sleep apnea (adult) (pediatric): Secondary | ICD-10-CM | POA: Diagnosis not present

## 2015-10-03 ENCOUNTER — Ambulatory Visit (INDEPENDENT_AMBULATORY_CARE_PROVIDER_SITE_OTHER): Payer: Medicare HMO | Admitting: *Deleted

## 2015-10-03 ENCOUNTER — Telehealth: Payer: Self-pay

## 2015-10-03 DIAGNOSIS — R001 Bradycardia, unspecified: Secondary | ICD-10-CM

## 2015-10-03 DIAGNOSIS — I5022 Chronic systolic (congestive) heart failure: Secondary | ICD-10-CM | POA: Diagnosis not present

## 2015-10-03 DIAGNOSIS — Z950 Presence of cardiac pacemaker: Secondary | ICD-10-CM

## 2015-10-03 NOTE — Progress Notes (Signed)
Remote pacemaker transmission.   

## 2015-10-03 NOTE — Telephone Encounter (Signed)
Remote ICM transmission received.  Attempted patient call and left message for return call.   

## 2015-10-03 NOTE — Progress Notes (Signed)
EPIC Encounter for ICM Monitoring  Patient Name: Alexander Curtis is a 70 y.o. male Date: 10/03/2015 Primary Care Physican: Vikki Ports, MD Primary Cardiologist: Harrington Challenger Electrophysiologist: Allred Dry Weight: unknown   Bi-V Pacing 98%      In the past month, have you:  1. Gained more than 2 pounds in a day or more than 5 pounds in a week? N/A  2. Had changes in your medications (with verification of current medications)? N/A  3. Had more shortness of breath than is usual for you? N/A  4. Limited your activity because of shortness of breath? N/A  5. Not been able to sleep because of shortness of breath? N/A  6. Had increased swelling in your feet or ankles? N/A  7. Had symptoms of dehydration (dizziness, dry mouth, increased thirst, decreased urine output) N/A  8. Had changes in sodium restriction? N/A  9. Been compliant with medication? N/A   ICM trend: 3 month view for 10/03/2015   ICM trend: 1 year view for 10/03/2015   Follow-up plan: ICM clinic phone appointment on  11/06/2015.  Attempted call to patient and unable to reach.  Transmission reviewed.  Thoracic impedance below reference line from 09/20/2015 to 09/24/2015 suggesting fluid accumulation and returned to reference line on 09/25/2015.     [name], RN, CCM 10/03/2015 3:00 PM

## 2015-10-04 NOTE — Progress Notes (Signed)
Received call from patient.  He reported he is doing well.  He takes Furosemide prn and has not taken any in the last month.  Current weight is 197 lbs.  No changes in meds.  Denied any fluid symptoms and did not have any fluid symptoms between 09/20/2015 to 09/24/2015.   Advised to call for any fluid symptoms.  No changes today.

## 2015-10-14 ENCOUNTER — Ambulatory Visit: Payer: Medicare HMO | Admitting: Internal Medicine

## 2015-10-22 DIAGNOSIS — G4733 Obstructive sleep apnea (adult) (pediatric): Secondary | ICD-10-CM | POA: Diagnosis not present

## 2015-11-01 ENCOUNTER — Encounter: Payer: Self-pay | Admitting: Cardiology

## 2015-11-01 LAB — CUP PACEART REMOTE DEVICE CHECK
Brady Statistic RA Percent Paced: 21 %
Brady Statistic RV Percent Paced: 98 %
Date Time Interrogation Session: 20170421123231
Implantable Lead Implant Date: 20151220
Implantable Lead Implant Date: 20151220
Implantable Lead Implant Date: 20160819
Implantable Lead Location: 753858
Implantable Lead Location: 753859
Implantable Lead Location: 753860
Implantable Lead Model: 5076
Implantable Lead Model: 5076
Lead Channel Impedance Value: 390 Ohm
Lead Channel Impedance Value: 400 Ohm
Lead Channel Impedance Value: 440 Ohm
Lead Channel Pacing Threshold Amplitude: 0.625 V
Lead Channel Pacing Threshold Amplitude: 1.375 V
Lead Channel Pacing Threshold Amplitude: 1.625 V
Lead Channel Pacing Threshold Pulse Width: 0.5 ms
Lead Channel Pacing Threshold Pulse Width: 0.5 ms
Lead Channel Pacing Threshold Pulse Width: 8 ms
Lead Channel Sensing Intrinsic Amplitude: 4.1 mV
Lead Channel Sensing Intrinsic Amplitude: 4.1 mV
Pulse Gen Serial Number: 3099689

## 2015-11-05 ENCOUNTER — Telehealth: Payer: Self-pay

## 2015-11-05 NOTE — Telephone Encounter (Signed)
Attempted return call to patient as requested.  He asked for next scheduled ICM remote transmission which is 4/26/207.  No answer.

## 2015-11-05 NOTE — Telephone Encounter (Signed)
Received call back from patient.  Advised transmission date is tomorrow and should automatically be sent over night.  He requested call back after 10:00am.

## 2015-11-06 ENCOUNTER — Ambulatory Visit (INDEPENDENT_AMBULATORY_CARE_PROVIDER_SITE_OTHER): Payer: Medicare HMO

## 2015-11-06 DIAGNOSIS — Z950 Presence of cardiac pacemaker: Secondary | ICD-10-CM | POA: Diagnosis not present

## 2015-11-06 DIAGNOSIS — I5022 Chronic systolic (congestive) heart failure: Secondary | ICD-10-CM

## 2015-11-06 NOTE — Progress Notes (Signed)
EPIC Encounter for ICM Monitoring  Patient Name: Alexander Curtis is a 70 y.o. male Date: 11/06/2015 Primary Care Physican: Vikki Ports, MD Primary Cardiologist: Harrington Challenger Electrophysiologist: Allred Dry Weight: unknown   Bi-V Pacing 97%     In the past month, have you:  1. Gained more than 2 pounds in a day or more than 5 pounds in a week? no  2. Had changes in your medications (with verification of current medications)? no  3. Had more shortness of breath than is usual for you? no  4. Limited your activity because of shortness of breath? no  5. Not been able to sleep because of shortness of breath? no  6. Had increased swelling in your feet or ankles? no  7. Had symptoms of dehydration (dizziness, dry mouth, increased thirst, decreased urine output) no  8. Had changes in sodium restriction? no  9. Been compliant with medication? Yes  ICM trend: 3 month view for 11/06/2015        ICM trend: 1 year view for 11/06/2015   Follow-up plan: ICM clinic phone appointment 12/10/2015.    FLUID LEVELS: Corvue  thoracic impedance decreased 10/27/2015 to 10/29/2015 suggesting fluid accumulation and returned to reference line 10/30/2015 suggesting stable fluid levels.   SYMPTOMS: None. Encouraged to call for any fluid symptoms.  EDUCATION: Limit sodium intake to < 2000 mg and fluid intake to 64 oz daily.   No changes today.   He will be traveling on following dates: 11/16/2015 to 11/18/2015, 11/21/2015 to 11/29/2015.  Has [place] tour scheduled for July, [place] cruise in November.   [name], RN, CCM 11/06/2015 2:33 PM

## 2015-11-11 ENCOUNTER — Telehealth: Payer: Self-pay

## 2015-11-11 ENCOUNTER — Other Ambulatory Visit: Payer: Self-pay | Admitting: Internal Medicine

## 2015-11-11 DIAGNOSIS — I428 Other cardiomyopathies: Secondary | ICD-10-CM

## 2015-11-11 MED ORDER — CARVEDILOL 6.25 MG PO TABS: 6.2500 mg | ORAL_TABLET | Freq: Two times a day (BID) | ORAL | Status: DC

## 2015-11-11 NOTE — Telephone Encounter (Signed)
Received voice mail message from patient requesting a refill for Carvedilol.   Call to patient.   He reported only having 1 tablet left of Carvedilol 6.25 mg.  He reported he takes it twice a day and confirmed preferred pharmacy and advised will send refill to his pharmacy.  Refill script sent to [facility] on [address].

## 2015-11-15 ENCOUNTER — Encounter: Payer: Self-pay | Admitting: Cardiology

## 2015-11-21 DIAGNOSIS — G4733 Obstructive sleep apnea (adult) (pediatric): Secondary | ICD-10-CM | POA: Diagnosis not present

## 2015-12-10 ENCOUNTER — Ambulatory Visit (INDEPENDENT_AMBULATORY_CARE_PROVIDER_SITE_OTHER): Payer: Medicare HMO

## 2015-12-10 DIAGNOSIS — I5022 Chronic systolic (congestive) heart failure: Secondary | ICD-10-CM | POA: Diagnosis not present

## 2015-12-10 DIAGNOSIS — Z950 Presence of cardiac pacemaker: Secondary | ICD-10-CM | POA: Diagnosis not present

## 2015-12-10 NOTE — Progress Notes (Signed)
EPIC Encounter for ICM Monitoring  Patient Name: Alexander Curtis is a 70 y.o. male Date: 12/10/2015 Primary Care Physican: Vikki Ports, MD Primary Cardiologist: Harrington Challenger Electrophysiologist: Allred Dry Weight: unknown   Bi-V Pacing 97%      In the past month, have you:  1. Gained more than 2 pounds in a day or more than 5 pounds in a week? N/A  2. Had changes in your medications (with verification of current medications)? N/A  3. Had more shortness of breath than is usual for you? N/A  4. Limited your activity because of shortness of breath? N/A  5. Not been able to sleep because of shortness of breath? N/A  6. Had increased swelling in your feet or ankles? N/A  7. Had symptoms of dehydration (dizziness, dry mouth, increased thirst, decreased urine output) N/A  8. Had changes in sodium restriction? N/A  9. Been compliant with medication? N/A   ICM trend: 3 month view for 12/10/2015   ICM trend: 1 year view for 12/10/2015   Follow-up plan: ICM clinic phone appointment on 01/13/2016.  Attempted call to patient and unable to reach.  Transmission reviewed.  Thoracic impedance above reference line from 11/20/2015 to 11/25/2015, 11/30/2015 to 12/10/2015 suggesting dryness.      [name], RN, CCM 12/10/2015 3:22 PM

## 2015-12-11 NOTE — Progress Notes (Signed)
Received call back from patient.  He stated he has had a cold and taking cold medication since his last trip from [place] and 5 other states.  He denied any fluid symptoms.  Reviewed transmission and advised he may have been a little dry during the time impedance was above baseline.  He reported he may not have been drinking enough fluids due to the cold.  He is feeling better.  No changes in meds.  Next remote transmission is 01/15/2016.

## 2015-12-22 DIAGNOSIS — G4733 Obstructive sleep apnea (adult) (pediatric): Secondary | ICD-10-CM | POA: Diagnosis not present

## 2015-12-23 ENCOUNTER — Encounter: Payer: Self-pay | Admitting: Internal Medicine

## 2016-01-09 ENCOUNTER — Ambulatory Visit (HOSPITAL_COMMUNITY)
Admission: RE | Admit: 2016-01-09 | Discharge: 2016-01-09 | Disposition: A | Discharge status: Home / Self Care | Payer: Medicare HMO | Source: Ambulatory Visit | Attending: Internal Medicine | Admitting: Internal Medicine

## 2016-01-09 ENCOUNTER — Telehealth: Payer: Self-pay | Admitting: *Deleted

## 2016-01-09 DIAGNOSIS — Z950 Presence of cardiac pacemaker: Secondary | ICD-10-CM

## 2016-01-09 DIAGNOSIS — T82111A Breakdown (mechanical) of cardiac pulse generator (battery), initial encounter: Secondary | ICD-10-CM | POA: Diagnosis not present

## 2016-01-09 NOTE — Telephone Encounter (Signed)
Spoke to patient about Dr.Allred's recommendation for him to have a cxr d/t decreased Rwave amplitude seen on CRT-P remote. I explained to patient the reason behind needing the cxr and where he would go to have it done. Patient voiced understanding.  Patient plans to have it done this afternoon.  Will inform patient about any further recommendations once cxr is perfomed.

## 2016-01-10 ENCOUNTER — Telehealth: Payer: Self-pay | Admitting: *Deleted

## 2016-01-10 NOTE — Telephone Encounter (Signed)
Patient called regarding results of cxr. I explained to patient that it has not yet been reviewed by Dr.Allred, but no changes were seen on the preliminary report.   I told patient that we would call him back with the final results once it's reviewed by Dr.Allred. Patient voiced understanding.

## 2016-01-13 ENCOUNTER — Telehealth: Payer: Self-pay | Admitting: Internal Medicine

## 2016-01-13 NOTE — Telephone Encounter (Signed)
Returned call. Reviewed Dr. Jackalyn Lombard results of CXR. Leads are stable, no further intervention at this time. Patient made aware that we will continue to monitor these trends, he verbalizes understanding.

## 2016-01-13 NOTE — Telephone Encounter (Signed)
New message   1. Has your device fired? no 2. Is you device beeping? no 3. Are you experiencing draining or swelling at device site? no 4. Are you calling to see if we received your device transmission? yes 5. Have you passed out? No  He got a call to go to the ER and he has not heard back from anyone about his device

## 2016-01-15 ENCOUNTER — Ambulatory Visit (INDEPENDENT_AMBULATORY_CARE_PROVIDER_SITE_OTHER): Payer: Medicare HMO | Admitting: *Deleted

## 2016-01-15 DIAGNOSIS — Z950 Presence of cardiac pacemaker: Secondary | ICD-10-CM | POA: Diagnosis not present

## 2016-01-15 DIAGNOSIS — I441 Atrioventricular block, second degree: Secondary | ICD-10-CM | POA: Diagnosis not present

## 2016-01-15 DIAGNOSIS — I5022 Chronic systolic (congestive) heart failure: Secondary | ICD-10-CM | POA: Diagnosis not present

## 2016-01-15 LAB — HEMOGLOBIN A1C: Hemoglobin A1C: 6.3

## 2016-01-15 NOTE — Progress Notes (Signed)
EPIC Encounter for ICM Monitoring  Patient Name: Alexander Curtis is a 70 y.o. male Date: 01/15/2016 Primary Care Physican: Vikki Ports, MD Primary Cardiologist: Harrington Challenger Electrophysiologist: Allred Dry Weight: 196 lb  Bi-V Pacing:  97%       Heart Failure questions reviewed, pt asymptomatic   Thoracic impedence below reference line 01/09/2016 and very close to baseline 01/15/2016.  He has increased fluid intake due to hot and humid weather over the holiday.  Low sodium diet education provided  He will be out of the country from 01/21/2016 to 01/30/2016.   ICM trend: 01/15/2016    Follow-up plan: ICM clinic phone appointment on 02/18/2016.  Copy of ICM check sent to device physician.   [name], RN 01/15/2016 2:22 PM

## 2016-01-15 NOTE — Progress Notes (Signed)
Remote pacemaker transmission.   

## 2016-01-21 DIAGNOSIS — G4733 Obstructive sleep apnea (adult) (pediatric): Secondary | ICD-10-CM | POA: Diagnosis not present

## 2016-01-22 ENCOUNTER — Encounter: Payer: Self-pay | Admitting: Cardiology

## 2016-01-22 LAB — CUP PACEART REMOTE DEVICE CHECK
Battery Remaining Longevity: 86 mo
Battery Remaining Percentage: 95 %
Battery Voltage: 2.98 V
Brady Statistic RA Percent Paced: 24 %
Brady Statistic RV Percent Paced: 97 %
Date Time Interrogation Session: 20170712162840
Implantable Lead Implant Date: 20151220
Implantable Lead Implant Date: 20151220
Implantable Lead Implant Date: 20160819
Implantable Lead Location: 753858
Implantable Lead Location: 753859
Implantable Lead Location: 753860
Implantable Lead Model: 5076
Implantable Lead Model: 5076
Lead Channel Impedance Value: 410 Ohm
Lead Channel Impedance Value: 430 Ohm
Lead Channel Impedance Value: 440 Ohm
Lead Channel Pacing Threshold Amplitude: 0.75 V
Lead Channel Pacing Threshold Amplitude: 0.75 V
Lead Channel Pacing Threshold Amplitude: 1.25 V
Lead Channel Pacing Threshold Pulse Width: 0.5 ms
Lead Channel Pacing Threshold Pulse Width: 0.5 ms
Lead Channel Pacing Threshold Pulse Width: 0.8 ms
Lead Channel Sensing Intrinsic Amplitude: 3.5 mV
Lead Channel Sensing Intrinsic Amplitude: 4 mV
Lead Channel Setting Pacing Amplitude: 2 V
Lead Channel Setting Pacing Amplitude: 2 V
Lead Channel Setting Pacing Amplitude: 2.25 V
Lead Channel Setting Pacing Pulse Width: 0.5 ms
Lead Channel Setting Pacing Pulse Width: 0.8 ms
Pulse Gen Serial Number: 3099689

## 2016-02-05 ENCOUNTER — Encounter: Payer: Self-pay | Admitting: Cardiology

## 2016-02-05 DIAGNOSIS — G4733 Obstructive sleep apnea (adult) (pediatric): Secondary | ICD-10-CM | POA: Diagnosis not present

## 2016-02-07 ENCOUNTER — Telehealth: Payer: Self-pay | Admitting: Family Medicine

## 2016-02-07 NOTE — Telephone Encounter (Signed)
Pt come in and dropped some labs off from the [place] and some progress notes for you, put in you folder,

## 2016-02-11 NOTE — Telephone Encounter (Signed)
Labs/notes reviewed and will be scanned. Labs 01/15/16 chol 140, HDL 50, LDL 81, TG 45 10 yr risk of heart dz or stroke 36.6% so recommended increasing atorvastatin to '40mg'$  b-met normal except glucose 108 Urine microalb/Cr 5.8 A1c 6.3% B12 and folate normal Negative hep C screen  XR R shoulder--mild degen changes of AC joint and glenohumeral joint. Per progress notes, MRI also ordered. Tylenol recommended  Progress notes reviewed--starting to have some neuropathy (tingling in feet). Started gabapentin '100mg'$  TID

## 2016-02-12 ENCOUNTER — Encounter: Payer: Self-pay | Admitting: *Deleted

## 2016-02-18 ENCOUNTER — Ambulatory Visit (INDEPENDENT_AMBULATORY_CARE_PROVIDER_SITE_OTHER): Payer: Medicare HMO

## 2016-02-18 ENCOUNTER — Encounter: Payer: Self-pay | Admitting: Family Medicine

## 2016-02-18 DIAGNOSIS — Z950 Presence of cardiac pacemaker: Secondary | ICD-10-CM | POA: Diagnosis not present

## 2016-02-18 DIAGNOSIS — I5022 Chronic systolic (congestive) heart failure: Secondary | ICD-10-CM

## 2016-02-19 ENCOUNTER — Encounter: Payer: Self-pay | Admitting: Family Medicine

## 2016-02-19 ENCOUNTER — Telehealth: Payer: Self-pay

## 2016-02-19 NOTE — Progress Notes (Signed)
EPIC Encounter for ICM Monitoring  Patient Name: Alexander Curtis is a 70 y.o. male Date: 02/19/2016 Primary Care Physican: Vikki Ports, MD Primary Cardiologist: Harrington Challenger Electrophysiologist: Allred Dry Weight: unknown Bi-V Pacing:  97%       Attempted patient call and unable to reach.  Transmission reviewed.   Thoracic impedance normal.   ICM trend: 02/19/2016     Follow-up plan: ICM clinic phone appointment on 03/24/2016.  Copy of ICM check sent to device physician.   [name], RN 02/19/2016 10:10 AM

## 2016-02-19 NOTE — Telephone Encounter (Signed)
Remote ICM transmission received.  Attempted patient call and left detailed message and requested a  return call.

## 2016-02-21 DIAGNOSIS — G4733 Obstructive sleep apnea (adult) (pediatric): Secondary | ICD-10-CM | POA: Diagnosis not present

## 2016-03-09 IMAGING — CR DG CHEST 2V
2 series · 2 of 2 positions shown · non-contrast
Comparison: 06/26/2014.

CLINICAL DATA: Pacemaker.

EXAM:
CHEST  2 VIEW

[chest pa]
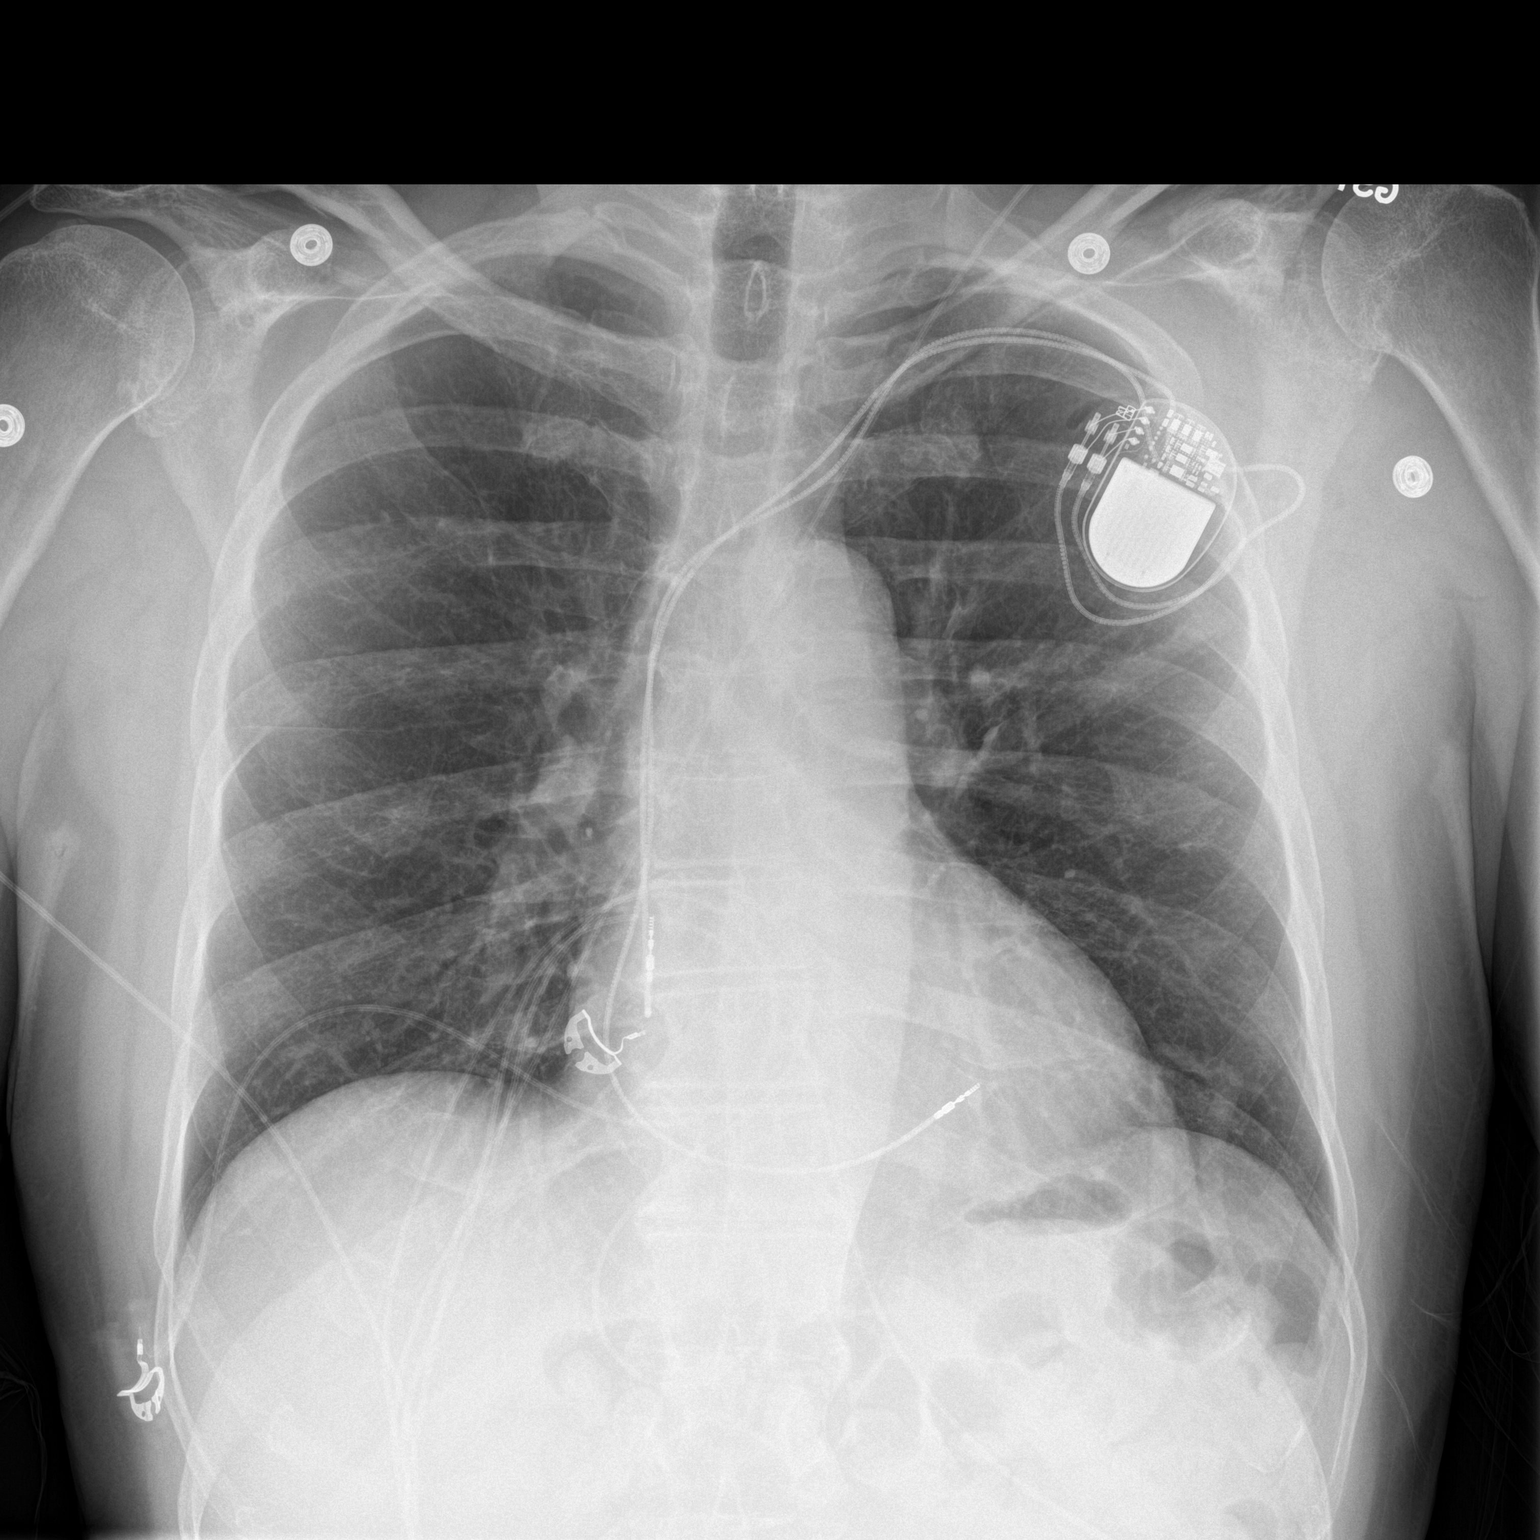

[chest lat]
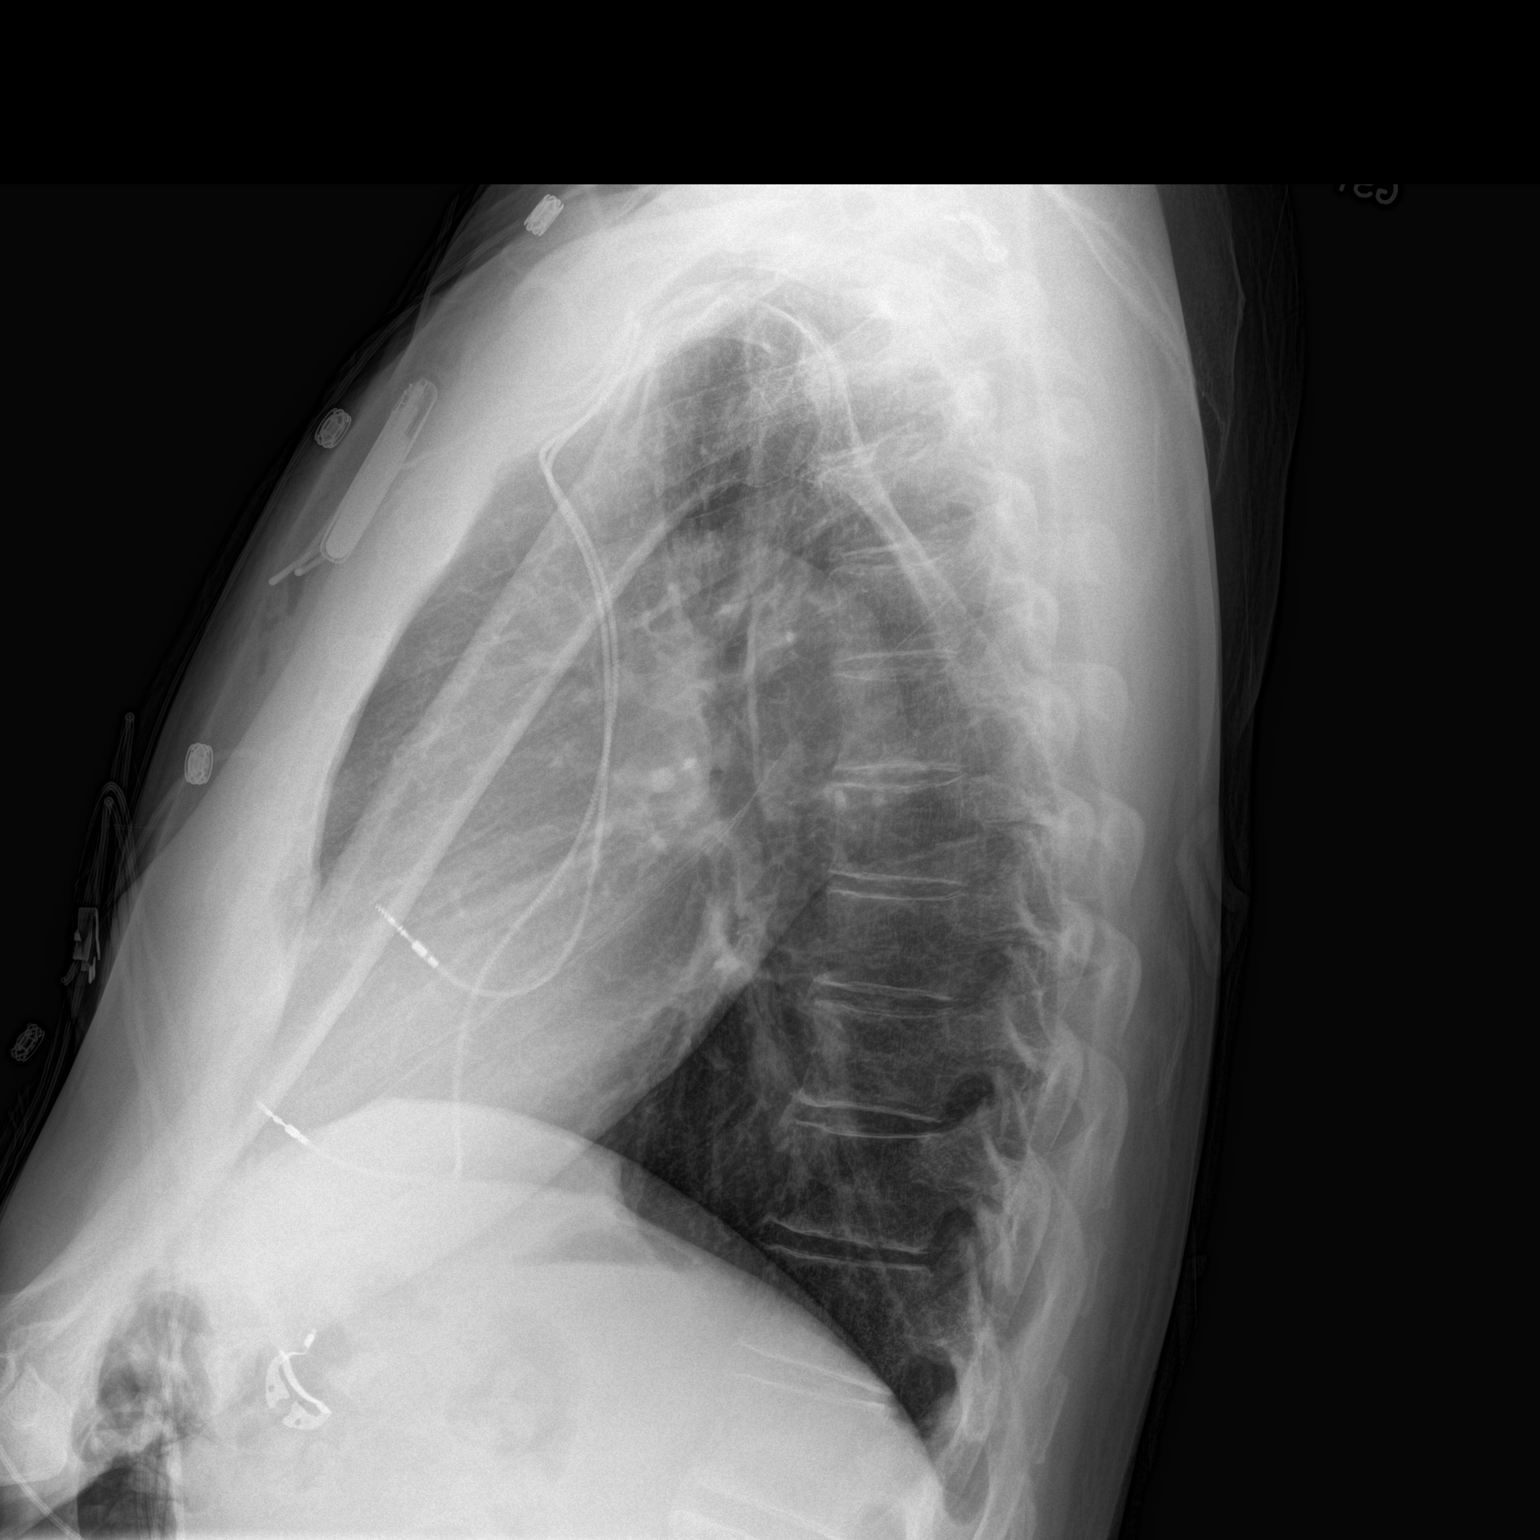

[2 of 2 positions shown; findings below may reference images not displayed]

FINDINGS: Mediastinum hilar structures normal. Cardiac pacer and lead tips in
good anatomic position. Lungs are clear. No pleural effusion or
pneumothorax. Heart size and pulmonary vascularity normal. No acute
osseus abnormality. Degenerative changes both shoulders.
IMPRESSION: 1. Interval placement of pacemaker, pacemaker leads are noted with
tips in the right atrium and right ventricle. Heart size normal.
2. No acute pulmonary disease.

## 2016-03-23 DIAGNOSIS — G4733 Obstructive sleep apnea (adult) (pediatric): Secondary | ICD-10-CM | POA: Diagnosis not present

## 2016-03-24 ENCOUNTER — Ambulatory Visit (INDEPENDENT_AMBULATORY_CARE_PROVIDER_SITE_OTHER): Payer: Medicare HMO

## 2016-03-24 DIAGNOSIS — Z950 Presence of cardiac pacemaker: Secondary | ICD-10-CM | POA: Diagnosis not present

## 2016-03-24 DIAGNOSIS — I5022 Chronic systolic (congestive) heart failure: Secondary | ICD-10-CM

## 2016-03-24 NOTE — Progress Notes (Signed)
EPIC Encounter for ICM Monitoring  Patient Name: Alexander Curtis is a 70 y.o. male Date: 03/24/2016 Primary Care Physican: Vikki Ports, MD Primary Cardiologist: Harrington Challenger Electrophysiologist: Allred Dry Weight:     203 lbs Bi-V Pacing:  97%                    Heart Failure questions reviewed, pt had weight gain  of 3 lbs.  He stated they had a party at their home this past weekend and foods were higher in sodium  Thoracic impedance abnormal suggesting fluid accumulation 03/22/2016 to 03/24/2016.  Recommendations: No changes.  He stated he would like to get back on track on the low salt diet instead of taking any Furosemide at this time.     Follow-up plan: ICM clinic phone appointment on 04/07/2016 and 04/27/2016.  Copy of ICM check sent to cardiologist and device physician.   ICM trend: 03/24/2016       [name], RN 03/24/2016 2:57 PM

## 2016-04-07 ENCOUNTER — Ambulatory Visit (INDEPENDENT_AMBULATORY_CARE_PROVIDER_SITE_OTHER): Payer: Medicare HMO

## 2016-04-07 ENCOUNTER — Telehealth: Payer: Self-pay

## 2016-04-07 DIAGNOSIS — Z950 Presence of cardiac pacemaker: Secondary | ICD-10-CM

## 2016-04-07 DIAGNOSIS — I5022 Chronic systolic (congestive) heart failure: Secondary | ICD-10-CM

## 2016-04-07 NOTE — Progress Notes (Signed)
EPIC Encounter for ICM Monitoring  Patient Name: ABDULAI MAPSON is a 70 y.o. male Date: 04/07/2016 Primary Care Physican: Vikki Ports, MD Primary Cardiologist:Ross Electrophysiologist: Allred Dry Weight:unknown Bi-V Pacing: 97%      Attempted ICM call and unable to reach. Left detailed message regarding todays transmission. Transmission reviewed.   Thoracic impedance returned to normal.  Follow-up plan: ICM clinic phone appointment on 04/27/2016.  Copy of ICM check sent to device physician.   ICM trend: 04/07/2016       [name], RN 04/07/2016 10:34 AM

## 2016-04-07 NOTE — Telephone Encounter (Signed)
Remote ICM transmission received.  Attempted patient call and left detailed message regarding today's transmission.  Encouraged to return call and next ICM scheduled for 04/27/2016.  Advised to return call for any fluid symptoms or questions.

## 2016-04-22 DIAGNOSIS — G4733 Obstructive sleep apnea (adult) (pediatric): Secondary | ICD-10-CM | POA: Diagnosis not present

## 2016-04-27 ENCOUNTER — Telehealth: Payer: Self-pay

## 2016-04-27 ENCOUNTER — Ambulatory Visit (INDEPENDENT_AMBULATORY_CARE_PROVIDER_SITE_OTHER): Payer: Medicare HMO | Admitting: *Deleted

## 2016-04-27 DIAGNOSIS — I5022 Chronic systolic (congestive) heart failure: Secondary | ICD-10-CM | POA: Diagnosis not present

## 2016-04-27 DIAGNOSIS — Z950 Presence of cardiac pacemaker: Secondary | ICD-10-CM

## 2016-04-27 DIAGNOSIS — I441 Atrioventricular block, second degree: Secondary | ICD-10-CM

## 2016-04-27 NOTE — Progress Notes (Signed)
EPIC Encounter for ICM Monitoring  Patient Name: Alexander Curtis is a 70 y.o. male Date: 04/27/2016 Primary Care Physican: Vikki Ports, MD Primary Cardiologist:Ross Electrophysiologist: Allred Dry Weight:unknown Bi-V Pacing: 97%                                                Attempted ICM call and unable to reach. Left detailed message regarding transmission.  Transmission reviewed.   Thoracic impedance normal.  Impedance below baseline 04/19/2016 to 04/26/2016 and returned to normal 04/27/2016.  Follow-up plan: ICM clinic phone appointment on 05/28/2016.  Copy of ICM check sent to device physician.   ICM trend: 04/27/2017       Alexander Billings, RN 04/27/2016 1:26 PM

## 2016-04-27 NOTE — Telephone Encounter (Signed)
Remote ICM transmission received.  Attempted patient call and left detailed message regarding transmission and next ICM scheduled for 05/28/2016.  Advised to return call for any fluid symptoms or questions.

## 2016-04-27 NOTE — Progress Notes (Signed)
Remote pacemaker transmission.   

## 2016-04-29 ENCOUNTER — Encounter: Payer: Self-pay | Admitting: Cardiology

## 2016-05-05 ENCOUNTER — Telehealth: Payer: Self-pay | Admitting: Family Medicine

## 2016-05-05 NOTE — Telephone Encounter (Signed)
Pt came in and dropped off office notes from the [place]. He has a appt scheduled to discuss findings with Dr. Tomi Bamberger and wanted her to have the opportunity to see prior to appt. He also stated that he is in the process of getting the CT scan they also completed. He will try to have that with him for appt. I am sending back for review.

## 2016-05-06 ENCOUNTER — Encounter: Payer: Self-pay | Admitting: Nurse Practitioner

## 2016-05-06 NOTE — Telephone Encounter (Signed)
Records reviewed.  03/26/16 fluoroscopic intra-articular contrast injection pror to CT arthrogram of R shoulder. Scout radiograph noted mineralization in RC soft tissues adjacent to greater tuberosity, suggesting RC calcific tendinopathy. Mild degenerative changes to glenohumeral joint also noted.  CT shoulder arthrogram (R)--(MRI not done due to pacemaker): Mineralization in supraspinatous tendon, adjacent to greater tuberosity, c/w calcific tendinopathy. No tears seen Mild AC joint degenerative changes Small pulmonary nodules--recommended dedicated chest CT to further eval.   (8mm opacities in RUL x 3 (on 3 images).  Chart reviewed--last CXR 01/09/16, no suspicious findings/nodules noted.  Needs chest CT. Not sure if this was already done at [place].  Will discuss at his visit.

## 2016-05-07 ENCOUNTER — Encounter: Payer: Self-pay | Admitting: Family Medicine

## 2016-05-07 ENCOUNTER — Ambulatory Visit (INDEPENDENT_AMBULATORY_CARE_PROVIDER_SITE_OTHER): Payer: Medicare HMO | Admitting: Family Medicine

## 2016-05-07 VITALS — BP 130/74 | HR 64 | Ht 72.05 in | Wt 202.8 lb

## 2016-05-07 DIAGNOSIS — M25511 Pain in right shoulder: Secondary | ICD-10-CM | POA: Diagnosis not present

## 2016-05-07 DIAGNOSIS — R918 Other nonspecific abnormal finding of lung field: Secondary | ICD-10-CM

## 2016-05-07 DIAGNOSIS — Z23 Encounter for immunization: Secondary | ICD-10-CM | POA: Diagnosis not present

## 2016-05-07 DIAGNOSIS — G8929 Other chronic pain: Secondary | ICD-10-CM | POA: Diagnosis not present

## 2016-05-07 NOTE — Progress Notes (Signed)
Chief Complaint  Patient presents with  . Advice Only    would like to follow up on nodules found on CT. Mentions that since January he has had a runny nose off and on for a week at at time, no other symptoms-not sure if this is related to anything but did want to mention this.    Seen at [facility] for right shoulder pain.  Surgery was suggested--was told that one of the risks was frozen shoulder, so suggested arthroscopic surgery. Needed CT arthrogram first, which he had, then referred to Dr. Veverly Fells to evaluate for surgery.  He suggested trial of a cortisone injection. He had the injection a few weeks ago (end of September, or early October). Pain improved; he has had injections in the past, but only temporary.  PT was then suggested--has to go through [place] at [place], early December is the soonest appointment.  [facility] is a [facility], as there have been some snafu's and delays in his care.  Dr. Veverly Fells was the first to mention the pulmonary nodule noted on CT to patient. He brings in letter dated 04/24/16--CT thorax results-- Pulmonary nodules up to 59mm.  If no risk for malignancy, no fu CT required.  If any risk factors, such as smoking or known malignancy, f/u in 1 year is recommended.  H/o melanoma removed 1968. His [facility] doctor recommended repeat CT in 1 year.  He has the scans on DVD and has a friend who is a radiologist--he will seek their opinion to ensure nothing needs to be done sooner.  Records he sent in earlier were reviewed.  03/26/16 fluoroscopic intra-articular contrast injection pror to CT arthrogram of R shoulder. Scout radiograph noted mineralization in RC soft tissues adjacent to greater tuberosity, suggesting RC calcific tendinopathy. Mild degenerative changes to glenohumeral joint also noted.  CT shoulder arthrogram (R)--(MRI not done due to pacemaker): Mineralization in supraspinatous tendon, adjacent to greater tuberosity, c/w calcific tendinopathy. No tears  seen Mild AC joint degenerative changes Small pulmonary nodules--recommended dedicated chest CT to further eval.   (36mm opacities in RUL x 3 (on 3 images).  Chart reviewed--last CXR 01/09/16, no suspicious findings/nodules noted.  DM--last A1c was 6.3 in July (when NOT taking metformin).  Only took metformin for a very short while (wife told him to stop when she saw kidney abnormalities on test results). Takes it prn when "cheating", ie on a cruise.  PMH, [place], SH reviewed and updated   Outpatient Encounter Prescriptions as of 05/07/2016  Medication Sig Note  . aspirin 81 MG tablet Take 81 mg by mouth every other day.   Marland Kitchen atorvastatin (LIPITOR) 10 MG tablet Take 5 mg by mouth daily.    . carvedilol (COREG) 6.25 MG tablet TAKE 1 TABLET(6.25 MG) BY MOUTH TWICE DAILY WITH A MEAL   . CINNAMON PO Take 2,000 mg by mouth 3 (three) times daily.    . Coenzyme Q10 (COQ10) 200 MG CAPS Take 1 capsule by mouth 2 (two) times daily.    . Cyanocobalamin (VITAMIN B-12 PO) Take 1 capsule by mouth daily.   . eszopiclone (LUNESTA) 2 MG TABS tablet Take 1 tablet (2 mg total) by mouth at bedtime as needed for sleep. Take immediately before bedtime 02/28/2015: Only if traveling  . fish oil-omega-3 fatty acids 1000 MG capsule Take 1 g by mouth 3 (three) times daily.    Nyoka Cowden Tea 250 MG CAPS Take 1 capsule by mouth daily.   Marland Kitchen lisinopril (PRINIVIL,ZESTRIL) 10  MG tablet Take 1 tablet (10 mg total) by mouth daily.   . Magnesium 500 MG TABS Take by mouth.   . Multiple Vitamins-Minerals (MENS 50+ MULTI VITAMIN/MIN PO) Take 1 tablet by mouth daily.   . vitamin C (ASCORBIC ACID) 500 MG tablet Take 500 mg by mouth daily.   . furosemide (LASIX) 20 MG tablet Take 1 tablet (20 mg total) by mouth as directed. (Patient not taking: Reported on 05/07/2016) 05/07/2016: Used for 3 day once only (with fluid overload)  . metFORMIN (GLUCOPHAGE-XR) 500 MG 24 hr tablet Take 1 tablet (500 mg total) by mouth daily with breakfast.  (Patient not taking: Reported on 05/07/2016)    Facility-Administered Encounter Medications as of 05/07/2016  Medication  . influenza  inactive virus vaccine (FLUZONE/FLUARIX) injection 0.5 mL   ROS: no fever, chills, URI symptoms cough, shortness of breath, chest pain, palpitations, nausea, vomiting, bowel changes, bleeding, bruising rash.  +shoulder pain--improved after cortisone shot.  Moods normal  PHYSICAL EXAM:  BP 130/74 (BP Location: Left Arm, Patient Position: Sitting, Cuff Size: Normal)   Pulse 64   Ht 6' 0.05" (1.83 m)   Wt 202 lb 12.8 oz (92 kg) Comment: 196.4 at home undressed this am-wanted you to know.  BMI 27.47 kg/m   Well appearing, pleasant male in no distress He is in good spirits, but a little frustrating when recounting the story of his care through the [place]. He is in the process of getting assigned a new PCP  ASSESSMENT/PLAN:  Pulmonary nodules - small size.  no prior exams to asses stability. h/o melanoma in distant past.  Plan to repeat CT at [facility] in 1 year   Need for prophylactic vaccination and inoculation against influenza - Plan: Flu vaccine HIGH DOSE PF (Fluzone High dose)  Chronic right shoulder pain - improved s/p cortisone shot.  Needs PT. May require surgery.  All being done through [place] (and Dr. Veverly Fells)     Continue home exercise program. See if there is a cancellation list for PT at [facility]. Other option would be to use your regular insurance for PT locally--they can get you in very quickly.  I agree with getting your friend to read the disk, since we don't have the formal reading of the CT available.  Likely routine follow-up would be 6-12 months.  If no change in size, may not need further evaluation.

## 2016-05-07 NOTE — Patient Instructions (Signed)
  Continue home exercise program. See if there is a cancellation list for PT at [facility]. Other option would be to use your regular insurance for PT locally--they can get you in very quickly.  I agree with getting your friend to read the disk, since we don't have the formal reading of the CT available.  Likely routine follow-up would be 6-12 months.  If no change in size, may not need further evaluation.

## 2016-05-08 LAB — CUP PACEART REMOTE DEVICE CHECK
Battery Remaining Longevity: 80 mo
Brady Statistic RA Percent Paced: 24 %
Brady Statistic RV Percent Paced: 97 %
Date Time Interrogation Session: 20171027110059
Implantable Lead Implant Date: 20151220
Implantable Lead Implant Date: 20151220
Implantable Lead Implant Date: 20160819
Implantable Lead Location: 753858
Implantable Lead Location: 753859
Implantable Lead Location: 753860
Implantable Lead Model: 5076
Implantable Lead Model: 5076
Lead Channel Impedance Value: 400 Ohm
Lead Channel Impedance Value: 400 Ohm
Lead Channel Impedance Value: 410 Ohm
Lead Channel Pacing Threshold Amplitude: 0.5 V
Lead Channel Pacing Threshold Amplitude: 0.625 V
Lead Channel Pacing Threshold Amplitude: 0.75 V
Lead Channel Pacing Threshold Pulse Width: 0.5 ms
Lead Channel Pacing Threshold Pulse Width: 0.5 ms
Lead Channel Pacing Threshold Pulse Width: 0.8 ms
Lead Channel Sensing Intrinsic Amplitude: 3.4 mV
Lead Channel Sensing Intrinsic Amplitude: 3.6 mV
Lead Channel Setting Pacing Amplitude: 2 V
Lead Channel Setting Pacing Amplitude: 2 V
Lead Channel Setting Pacing Amplitude: 2.25 V
Lead Channel Setting Pacing Pulse Width: 0.5 ms
Lead Channel Setting Pacing Pulse Width: 0.8 ms
Pulse Gen Serial Number: 3099689

## 2016-05-13 ENCOUNTER — Telehealth: Payer: Self-pay | Admitting: Internal Medicine

## 2016-05-13 NOTE — Telephone Encounter (Signed)
New Message:    Pt wanted you to know he will be out of the country from 05-13-16 thru 07-26-15.

## 2016-05-23 DIAGNOSIS — G4733 Obstructive sleep apnea (adult) (pediatric): Secondary | ICD-10-CM | POA: Diagnosis not present

## 2016-05-27 DIAGNOSIS — G4733 Obstructive sleep apnea (adult) (pediatric): Secondary | ICD-10-CM | POA: Diagnosis not present

## 2016-05-28 ENCOUNTER — Ambulatory Visit (INDEPENDENT_AMBULATORY_CARE_PROVIDER_SITE_OTHER): Payer: Medicare HMO

## 2016-05-28 DIAGNOSIS — I5022 Chronic systolic (congestive) heart failure: Secondary | ICD-10-CM

## 2016-05-28 DIAGNOSIS — Z950 Presence of cardiac pacemaker: Secondary | ICD-10-CM | POA: Diagnosis not present

## 2016-05-29 NOTE — Progress Notes (Signed)
EPIC Encounter for ICM Monitoring  Patient Name: Alexander Curtis is a 70 y.o. male Date: 05/29/2016 Primary Care Physican: Vikki Ports, MD Primary Cardiologist:Ross Electrophysiologist: Allred Dry Weight:198 lb Bi-V Pacing: 97%      Heart Failure questions reviewed, pt asymptomatic.  Thoracic impedance normal   Recommendations: No changes.  Reinforced low salt food choices and limiting fluid intake to < 2 liters per day.  Encouraged to call for fluid symptoms.    Follow-up plan: ICM clinic phone appointment on 07/02/2016.  Office appt with Chanetta Marshall, NP 06/01/2016.  Copy of ICM check sent to device physician.   ICM trend: 05/28/2016       [name], RN 05/29/2016 11:22 AM

## 2016-05-31 NOTE — Progress Notes (Signed)
Electrophysiology Office Note Date: 06/01/2016  ID:  Alexander Curtis, Alexander Curtis 10-19-1945, MRN [id number]  PCP: Alexander Ports, Curtis Primary Cardiologist: Alexander Curtis Electrophysiologist: Alexander Curtis  CC: CRTP follow up  Alexander Curtis is a 70 y.o. male seen today for Alexander Curtis.  He underwent pacemaker implantation in 2015 for symptomatic Mobitz II heart block with subsequent CRTP upgrade 2016.  He presents today for routine EP follow up. Since last being seen in clinic, the patient reports doing reasonably well.  He denies chest pain, palpitations, dyspnea, PND, orthopnea, nausea, vomiting, dizziness, syncope, edema, weight gain, or early satiety.   Echo 10/2014 demonstrated EF 35-40%, severe hypokinesis of anteroseptal myocardium, grade 1 diastolic dysfunction, LA 41 Echo 08/2015 post CRT upgrade demonstrated EF [id number], grade 1 diastolic dysfunction, mild MR  Device History: MDT dual chamber PPM implanted 2015 for Mobitz II heart block; upgrade to STJ CRTP 02/2015 for NICM, CHF   Past Medical History:  Diagnosis Date  . Atrial enlargement, left   . Diabetes mellitus, type 2 ([place])   . HLD (hyperlipidemia)   . Hypertension 05/2013  . LBBB (left bundle branch block)   . Melanoma ([place]) 1968   R arm  . Mitral regurgitation   . Mobitz type 2 second degree heart block    a. s/p Medtronic Adapta L model ADDRL 1 (serial number [id number]) pacemaker. 07/02/14 b.  upgrade to STJ CRTP 02/2015  . Non-ischemic cardiomyopathy ([place])    a. felt to be 2/2 RV pacing  . Obstructive sleep apnea    a. not compliant with CPAP  . Pulmonary nodules    noted on CT of shoulder 03/2016, up to 68mm in size on CT chest 04/2016. repeat 1 year due to h/o melanoma  . Syncope 2011   a. 2011 -negative workup except LBBB. thought to be vasovagal  . Tricuspid regurgitation    Past Surgical History:  Procedure Laterality Date  . EP IMPLANTABLE DEVICE N/A 03/01/2015   STJ CRTP upgrade by Alexander Curtis  . FINGER TENDON REPAIR     R 2nd  and 3rd finger  . INGUINAL HERNIA REPAIR     bilateral  . KNEE ARTHROSCOPY  02/2011   L knee for meniscal tear. Alexander. Ninfa Curtis  . PERMANENT PACEMAKER INSERTION N/A 07/01/2014   MDT ADDRL1 pacemaker implanted for Mobitz II Alexander Curtis  . UVULECTOMY     laser treatment for sleep apnea (NOT UP3)  . VASECTOMY      Current Outpatient Prescriptions  Medication Sig Dispense Refill  . aspirin 81 MG tablet Take 81 mg by mouth every other day.    Marland Kitchen atorvastatin (LIPITOR) 10 MG tablet Take 5 mg by mouth daily.     Marland Kitchen b complex vitamins capsule Take 1 capsule by mouth daily.    . carvedilol (COREG) 6.25 MG tablet TAKE 1 TABLET(6.25 MG) BY MOUTH TWICE DAILY WITH A MEAL 180 tablet 1  . CINNAMON PO Take 2,000 mg by mouth 3 (three) times daily.     . Coenzyme Q10 (COQ10) 200 MG CAPS Take 1 capsule by mouth 2 (two) times daily.     . eszopiclone (LUNESTA) 2 MG TABS tablet Take 1 tablet (2 mg total) by mouth at bedtime as needed for sleep. Take immediately before bedtime 15 tablet 0  . fish oil-omega-3 fatty acids 1000 MG capsule Take 1 g by mouth 3 (three) times daily.     . furosemide (LASIX) 20 MG tablet Take 1 tablet (20 mg  total) by mouth as directed. 30 tablet 0  . Green Tea 250 MG CAPS Take 1 capsule by mouth daily.    Marland Kitchen lisinopril (PRINIVIL,ZESTRIL) 10 MG tablet Take 1 tablet (10 mg total) by mouth daily. 30 tablet 11  . Magnesium 500 MG TABS Take by mouth.    . metFORMIN (GLUCOPHAGE-XR) 500 MG 24 hr tablet Take 1 tablet (500 mg total) by mouth daily with breakfast. 30 tablet 5  . Multiple Vitamins-Minerals (MENS 50+ MULTI VITAMIN/MIN PO) Take 1 tablet by mouth daily.    . vitamin C (ASCORBIC ACID) 500 MG tablet Take 500 mg by mouth daily.     No current facility-administered medications for this visit.    Facility-Administered Medications Ordered in Other Visits  Medication Dose Route Frequency Provider Last Rate Last Dose  . influenza  inactive virus vaccine (FLUZONE/FLUARIX) injection 0.5 mL   0.5 mL Intramuscular Once Alexander Curtis        Allergies:   Horse-derived products; Pravastatin; and Adhesive [tape]   Social History: Social History   Social History  . Marital status: Married    Spouse name: N/A  . Number of children: 4  . Years of education: N/A   Occupational History  . retired    Social History Main Topics  . Smoking status: Never Smoker  . Smokeless tobacco: Never Used  . Alcohol use Yes     Comment: 3-4 drinks per month.  . Drug use: No  . Sexual activity: Not on file   Other Topics Concern  . Not on file   Social History Narrative   Lives with wife, cat.  Daughter, Junie Panning, in [place], 2 children in [place], son in [place].   10 grandchildren   Retired school principal       Family History: Family History  Problem Relation Age of Onset  . Dementia Mother   . Cancer Mother     ?type, was told it contributed to death, per pt  . Melanoma Mother 48  . Stroke Father 74    cerebral aneurysm  . Diabetes Sister     borderline, obese  . Diabetes Maternal Grandmother   . Heart disease Maternal Grandfather   . Heart attack Maternal Grandfather   . Dementia Paternal Aunt   . Parkinson's disease Maternal Aunt   . Hypertension Neg Hx      Review of Systems: All other systems reviewed and are otherwise negative except as noted above.   Physical Exam: VS:  BP 110/62   Pulse 69   Ht 6\' 1"  (1.854 m)   Wt 203 lb 12.8 oz (92.4 kg)   SpO2 96%   BMI 26.89 kg/m  , BMI Body mass index is 26.89 kg/m.  GEN- The patient is well appearing, alert and oriented x 3 today.   HEENT: normocephalic, atraumatic; sclera clear, conjunctiva pink; hearing intact; oropharynx clear; neck supple Lungs- Clear to ausculation bilaterally, normal work of breathing.  No wheezes, rales, rhonchi Heart- Regular rate and rhythm (paced) GI- soft, non-tender, non-distended, bowel sounds present Extremities- no clubbing, cyanosis, or edema; DP/PT/radial pulses 1+ bilaterally MS- no  significant deformity or atrophy Skin- warm and dry, no rash or lesion; PPM pocket well healed Psych- euthymic mood, full affect Neuro- strength and sensation are intact  PPM Interrogation- reviewed in detail today,  See PACEART report  EKG:  EKG is ordered today. The ekg ordered today shows sinus rhythm with ventricular pacing  Recent Labs: 09/02/2015: ALT 26; Hemoglobin 13.1; Platelets  136   Wt Readings from Last 3 Encounters:  06/01/16 203 lb 12.8 oz (92.4 kg)  05/07/16 202 lb 12.8 oz (92 kg)  09/02/15 202 lb 12.8 oz (92 kg)     Other studies Reviewed: Additional studies/ records that were reviewed today include: last echo, Alexander Jackalyn [place] office notes  Assessment and Plan:  1.  Chronic systolic heart failure Euvolemic on exam Continue current medical therapy EF improved post CRT upgrade Continue follow up in [facility] clinic   2.  Mobitz II heart block Normal PPM function R waves trending down over time. Threshold and impedence stable. Will follow for now. CXR 12/2015 with leads in stable position.  See Pace Art report No changes today  3.  HTN Stable No change required today   Current medicines are reviewed at length with the patient today.   The patient does not have concerns regarding his medicines.  The following changes were made today:  none  Labs/ tests ordered today include: none   Disposition:   Follow up with Alexander Alexander Curtis as scheduled, Delilah Shan, [facility] clinic, Alexander Curtis 1 year    Signed, Chanetta Marshall, NP 06/01/2016 11:04 AM  [facility] [address] [place] [address] [phone] (office) [phone] (fax)

## 2016-06-01 ENCOUNTER — Ambulatory Visit (INDEPENDENT_AMBULATORY_CARE_PROVIDER_SITE_OTHER): Payer: Medicare HMO | Admitting: Nurse Practitioner

## 2016-06-01 ENCOUNTER — Encounter: Payer: Self-pay | Admitting: Nurse Practitioner

## 2016-06-01 VITALS — BP 110/62 | HR 69 | Ht 73.0 in | Wt 203.8 lb

## 2016-06-01 DIAGNOSIS — I5022 Chronic systolic (congestive) heart failure: Secondary | ICD-10-CM

## 2016-06-01 DIAGNOSIS — I110 Hypertensive heart disease with heart failure: Secondary | ICD-10-CM

## 2016-06-01 DIAGNOSIS — I441 Atrioventricular block, second degree: Secondary | ICD-10-CM | POA: Diagnosis not present

## 2016-06-01 LAB — CUP PACEART INCLINIC DEVICE CHECK
Date Time Interrogation Session: 20171120122913
Implantable Lead Implant Date: 20151220
Implantable Lead Implant Date: 20151220
Implantable Lead Implant Date: 20160819
Implantable Lead Location: 753858
Implantable Lead Location: 753859
Implantable Lead Location: 753860
Implantable Lead Model: 5076
Implantable Lead Model: 5076
Implantable Pulse Generator Implant Date: 20160819
Pulse Gen Serial Number: 3099689

## 2016-06-01 NOTE — Patient Instructions (Addendum)
Medication Instructions:   Your physician recommends that you continue on your current medications as directed. Please refer to the Current Medication list given to you today.    If you need a refill on your cardiac medications before your next appointment, please call your pharmacy.  Labwork: NONE ORDERED  TODAY    Testing/Procedures: NONE ORDERED  TODAY    Follow-Up: Your physician wants you to follow-up in: [place] will receive a reminder letter in the mail two months in advance. If you don't receive a letter, please call our office to schedule the follow-up appointment.   Remote monitoring is used to monitor your Pacemaker of ICD from home. This monitoring reduces the number of office visits required to check your device to one time per year. It allows [place] to keep an eye on the functioning of your device to ensure it is working properly. You are scheduled for a device check from home on .Marland KitchenMarland Kitchen2/19/17..You may send your transmission at any time that day. If you have a wireless device, the transmission will be sent automatically. After your physician reviews your transmission, you will receive a postcard with your next transmission date.     Any Other Special Instructions Will Be Listed Below (If Applicable).

## 2016-06-15 ENCOUNTER — Encounter: Payer: Medicare HMO | Admitting: Nurse Practitioner

## 2016-06-22 DIAGNOSIS — G4733 Obstructive sleep apnea (adult) (pediatric): Secondary | ICD-10-CM | POA: Diagnosis not present

## 2016-06-30 DIAGNOSIS — G4733 Obstructive sleep apnea (adult) (pediatric): Secondary | ICD-10-CM | POA: Diagnosis not present

## 2016-07-02 ENCOUNTER — Ambulatory Visit (INDEPENDENT_AMBULATORY_CARE_PROVIDER_SITE_OTHER): Payer: Medicare HMO

## 2016-07-02 DIAGNOSIS — I5022 Chronic systolic (congestive) heart failure: Secondary | ICD-10-CM

## 2016-07-02 DIAGNOSIS — Z950 Presence of cardiac pacemaker: Secondary | ICD-10-CM | POA: Diagnosis not present

## 2016-07-02 LAB — HEMOGLOBIN A1C: Hemoglobin A1C: 6.3

## 2016-07-02 NOTE — Progress Notes (Signed)
EPIC Encounter for ICM Monitoring  Patient Name: Alexander Curtis is a 70 y.o. male Date: 07/02/2016 Primary Care Physican: Vikki Ports, MD Primary Cardiologist:Ross Electrophysiologist: Allred Dry Weight:Scale broke and will get a new Bi-V Pacing: 98%       Heart Failure questions reviewed, pt asymptomatic   Thoracic impedance normal.  Was abnormal suggesting fluid accumulation from 06/05/2016 to 06/18/2016.  Recommendations: No changes.  Reinforced to limit low salt food choices to 2000 mg day and limiting fluid intake to < 2 liters per day. Encouraged to call for fluid symptoms.  Provided ICM direct number.  Follow-up plan: ICM clinic phone appointment on 08/04/2016.  Copy of ICM check sent to [place].   ICM trend: 07/02/2016       [name], RN 07/02/2016 12:33 PM

## 2016-07-20 ENCOUNTER — Telehealth: Payer: Self-pay | Admitting: Family Medicine

## 2016-07-20 DIAGNOSIS — R69 Illness, unspecified: Secondary | ICD-10-CM | POA: Diagnosis not present

## 2016-07-20 NOTE — Telephone Encounter (Signed)
Pt dropped off labs from the [place] put in your red folder,

## 2016-07-21 NOTE — Telephone Encounter (Signed)
Reviewed-- [facility] labs 07/02/16  TC 126/LDL 51/TG 43/HDL 66.  A1c 6.3%, microalb/Cr ratio 6.3. c-met normal, glu 115

## 2016-07-22 ENCOUNTER — Encounter: Payer: Self-pay | Admitting: *Deleted

## 2016-07-23 DIAGNOSIS — G4733 Obstructive sleep apnea (adult) (pediatric): Secondary | ICD-10-CM | POA: Diagnosis not present

## 2016-08-04 ENCOUNTER — Ambulatory Visit (INDEPENDENT_AMBULATORY_CARE_PROVIDER_SITE_OTHER): Payer: Medicare HMO

## 2016-08-04 ENCOUNTER — Telehealth: Payer: Self-pay

## 2016-08-04 DIAGNOSIS — Z950 Presence of cardiac pacemaker: Secondary | ICD-10-CM | POA: Diagnosis not present

## 2016-08-04 DIAGNOSIS — I5022 Chronic systolic (congestive) heart failure: Secondary | ICD-10-CM | POA: Diagnosis not present

## 2016-08-04 NOTE — Telephone Encounter (Signed)
Remote ICM transmission received.  Attempted patient call and left message to return call.   

## 2016-08-04 NOTE — Progress Notes (Signed)
Patient returned call. He stated he is feeling fine and denied fluid symptoms. He stated he thinks he has been eating too much salt and will cut back.  Not currently prescribed a diuretic.  Will repeat transmission on 08/13/2016.  Appt with Dr Harrington Challenger on 09/07/2016.  No changes today.

## 2016-08-04 NOTE — Progress Notes (Signed)
EPIC Encounter for ICM Monitoring  Patient Name: Alexander Curtis is a 71 y.o. male Date: 08/04/2016 Primary Care Physican: Vikki Ports, MD Primary Cardiologist:Ross Electrophysiologist: Allred Dry Weight:unknown Bi-V Pacing: 98%                                                       Attempted call to patient and unable to reach.  Left message to return call.  Transmission reviewed.   Thoracic impedance abnormal suggesting fluid accumulation since 07/26/2016.  Recommendations:  NONE - Unable to reach patient   Follow-up plan: ICM clinic phone appointment on 08/13/2016 to recheck fluid levels.  Office appointment with Dr Harrington Challenger 09/07/2016  Copy of ICM check sent to primary cardiologist and device physician.   3 month ICM trend: 08/04/2016   1 Year ICM trend:      [name], RN 08/04/2016 1:44 PM

## 2016-08-04 NOTE — Progress Notes (Signed)
Please book in opening sooner if possible

## 2016-08-13 ENCOUNTER — Ambulatory Visit (INDEPENDENT_AMBULATORY_CARE_PROVIDER_SITE_OTHER): Payer: Medicare HMO

## 2016-08-13 DIAGNOSIS — I5022 Chronic systolic (congestive) heart failure: Secondary | ICD-10-CM

## 2016-08-13 DIAGNOSIS — Z950 Presence of cardiac pacemaker: Secondary | ICD-10-CM

## 2016-08-13 NOTE — Progress Notes (Signed)
EPIC Encounter for ICM Monitoring  Patient Name: Alexander Curtis is a 71 y.o. male Date: 08/13/2016 Primary Care Physican: Vikki Ports, MD Primary Cardiologist:Ross Electrophysiologist: Allred Dry Weight:unknown Bi-V Pacing: 98%       Heart Failure questions reviewed, pt asymptomatic   Thoracic impedance normal   Recommendations: No changes. Reminded to limit dietary salt intake to 2000 mg/day and fluid intake to < 2 liters/day. Encouraged to call for fluid symptoms.  Follow-up plan: ICM clinic phone appointment on 09/04/2016 and office appointment with Dr Harrington Challenger on 09/07/2016.   Copy of ICM check sent to device physician.   3 month ICM trend: 08/13/2016   1 Year ICM trend:      [name], RN 08/13/2016 4:05 PM

## 2016-08-16 NOTE — Progress Notes (Signed)
Chief Complaint  Patient presents with  . Medicare Wellness    fasting AWV/med check plus. [facility]ants [facility] know if she should have [facility]oscopy. He mentions that he has been taking a lot of IBU. Having some balance issues over the last year.     Alexander Curtis is a 71 y.o. male who presents for annual physical, Medicare wellness visit and follow-up on chronic medical conditions.  He is followed at the [place] and gets his medications there.  He was noted to have occult blood in the stool at the [place].  He has GI appt through the [facility] 2/22. He is concerned about having colonoscopy in [place], prefers to have it done locally.  He is hoping they will let him be referred to a local doctor. He will call for referral if they won't do. He denies any bleeding, bowel changes, abdominal pain.  Hyperlipidemia: Patient has been taking lipitor regularly. He has some soreness in his upper arms/shoulders, tolerable. He is trying to follow a lowfat, low cholesterol diet.His [place] [place]  [place] [place] and has no recollection of being told this, or that his medication changed. LDL at that time was >70 (81). [facility] [place] [facility] [place].  TC 126/LDL 51/TG 43/HDL 66. He confirmed (after going home and calling u[place] that he is taking 462mdose.  Diabetes--he was started on metformin after his visit in October 2015.He really doesn't take this. He takes it only when eating something bad, very infrequently (he reports taking it twice in the last 6 months).  Never had any side effects, just doesn't want to take it. [facility] [place] [facility] [place] [place] [facility] [place] [facility] [place].  Blood sugars are usually <100 "if good", up to 115 "if not good" with his diet. Denies polydipsia, polyuria, hypoglycemia; diabetic eye exam is UTD, [place] [facility] [place] He was started on gabapentin 10085mID for diabetic neuropathy in  his feet [facility] has achey arthritis pain, and a bone spur in his toe. He didn't believe he has neuropathy, so didn't take this. He [facility]  He never started the gabapentin.  He denies any burning pain, numbness or tingling.  Labs per [facility] [place] 07/02/16:  A1c 6.3%; microalb/Cr ratio 6.3.  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 He gets regular pacemaker checks, and is working well. No longer is having any fatigue or dyspnea on exertion.  CHF/cardiomyopathy: stable/controlled.  No longer on any diuretics. He denies edema, dyspnea, chest pain.  Hypertension:  BP's at home have been 127-129/69-72, pulse 65-68. No headaches, side effects to medication.  He reports some dizziness--when he stands, he sometimes feels like he pitches forwards and loses his balance.  Only notices this when he looks down. This has been going on over the last year. This is intermittent, not daily.  He is always able to catch himself, no falls. Feels like an equilibrium issue. He feels like this may have started when he got his hearing aids.  Denies light-headedness. [facility] [place] ([place] [facility] results), his lisinopril was increased to 57m35mHe doesn't recall this, unsure of what his bottle says. (  he confirmed 7m dose after he went home from visit).  Sleep apnea--He is compliant with using CPAP and tolerating well without problems.   Right shoulder pain.  He is s/p coritsone injection in Sept/October 2017. Surgery had been suggested, had  CT arthrogram.  Incidental finding of 562mpulmonary nodule. Due to h/o melanoma, it was recommended he repeat the CT in 1 year.  His pain had improved from the cortisone injection, and PT was also recommended. He has had 5 sessions so far (started in January), improving. Will decide in  April whether or not to do surgery vs ongoing PT.   Immunization History  Administered Date(s) Administered  . Influenza Split 05/02/2012  . Influenza, High Dose Seasonal PF 04/12/2014, 05/07/2016  . Influenza-Unspecified 03/28/2015  . Pneumococcal Conjugate-13 08/14/2015  . Pneumococcal Polysaccharide-23 03/28/2012  . Zoster 12/31/2011   allergic to tetanus  Last colonoscopy: 11/07; Reportedly normal (no results available to me) Last PSA: Lab Results  Component Value Date   PSA 0.48 09/02/2015   PSA 0.52 07/16/2014   Dentist: twice yearly Ophtho: [facility] [place]:  Walks dog daily (15-20 minutes or longer), some walking with his wife. No weight-bearing exercise (just shoulder exercises) [facility]  Other doctors caring for patient include: Cardiology: Dr. RoHarrington ChallengerDr. AlRayann Hemanphtho: [facility] LuTerance Iceentist: can't recall name (took over Dr. McClide Deutscherractice), in [facility]ermatologist[facility] Ortho:  Dr. BlNinfa LindenDr. NoVeverly FellsDepression screen:  Negative Fall screen: one, last week--hyperextended left knee.  Ladder tipped over (fell [id number] trying to cut a tree limb. Bearing weight, walking normally.  Some residual ache, improving. Functional status screen--notable for decreased vision from cataracts, decreased hearing ([facility] forgetting where he puts his keys and glasses; recently is forgetting how to get to restaurants he has been to before, takes a wrong turn.No other problems noted.  See full screen in epic.  End of Life Discussion:  Patient does not have a living will and medical power of attorney  Past Medical History:  Diagnosis Date  . Atrial enlargement, left   . Diabetes mellitus, type 2 (HCCPort St. Joe . HLD (hyperlipidemia)   . Hypertension 05/2013  . LBBB (left bundle branch block)   . Melanoma ([id number]   R arm  . Mitral regurgitation    . Mobitz type 2 second degree heart block    a. s/p Medtronic Adapta L model ADDRL 1 (serial number [id number] pacemaker. 07/02/14 b.  upgrade to STJ CRTP 02/2015  . Non-ischemic cardiomyopathy ([place]CCEast Highland Park  a. felt to be 2/2 RV pacing  . Obstructive sleep apnea    compliant with CPAP  . Pulmonary nodules    noted on CT of shoulder 03/2016, up to 5mm9m size on CT chest 04/2016. repeat 1 year due to h/o melanoma  . Syncope 2011   a. 2011 -negative workup except LBBB. thought to be vasovagal  . Tricuspid regurgitation     Past Surgical History:  Procedure Laterality Date  . EP IMPLANTABLE DEVICE N/A 03/01/2015   STJ CRTP upgrade by Dr AllrRayann HemanFINGER TENDON REPAIR     R 2nd and 3rd finger  . INGUINAL HERNIA REPAIR     bilateral  . KNEE ARTHROSCOPY  02/2011   L knee for meniscal tear. Dr. BlacNinfa LindenPERMANENT PACEMAKER INSERTION N/A  07/01/2014   MDT ADDRL1 pacemaker implanted for Mobitz II Dr Rayann Heman  . UVULECTOMY     laser treatment for sleep apnea (NOT UP3)  . VASECTOMY      Social History   Social History  . Marital status: Married    Spouse name: N/A  . Number of children: 4  . Years of education: N/A   Occupational History  . retired    Social History Main Topics  . Smoking status: Never Smoker  . Smokeless tobacco: Never Used  . Alcohol use Yes     Comment: 3-4 drinks per month.  . Drug use: No  . Sexual activity: Not Currently    Partners: Female   Other Topics Concern  . Not on file   Social History Narrative   Lives with wife, 1 dog.  Daughter, Junie Panning, in [place], 2 children in [place], son in [place].   10 grandchildren   Retired school principal       Family History  Problem Relation Age of Onset  . Dementia Mother   . Cancer Mother     ?type, was told it contributed to death, per pt  . Melanoma Mother 20  . Stroke Father 31    cerebral aneurysm  . Diabetes Sister     borderline, obese  . Diabetes Maternal Grandmother   . Heart disease Maternal  Grandfather   . Heart attack Maternal Grandfather   . Dementia Paternal Aunt   . Parkinson's disease Maternal Aunt   . Hypertension Neg Hx     Outpatient Encounter Prescriptions as of 08/17/2016  Medication Sig Note  . aspirin 81 MG tablet Take 81 mg by mouth every other day.   Marland Kitchen atorvastatin (LIPITOR) 10 MG tablet Take 5 mg by mouth daily.  08/17/2016: [facility], dose was changed to 65m tablet.  Pt needs to verify whether he is taking 567mor 40  . b complex vitamins capsule Take 1 capsule by mouth daily.   . carvedilol (COREG) 6.25 MG tablet TAKE 1 TABLET(6.25 MG) BY MOUTH TWICE DAILY WITH A MEAL   . CINNAMON PO Take 2,000 mg by mouth 3 (three) times daily.    . Coenzyme Q10 (COQ10) 200 MG CAPS Take 1 capsule by mouth 2 (two) times daily.    . fish oil-omega-3 fatty acids 1000 MG capsule Take 1 g by mouth 3 (three) times daily.    . Nyoka Cowdenea 250 MG CAPS Take 1 capsule by mouth daily.   . Marland Kitchenbuprofen (ADVIL,MOTRIN) 200 MG tablet Take 600 mg by mouth at bedtime.   . Marland Kitchenisinopril (PRINIVIL,ZESTRIL) 10 MG tablet Take 1 tablet (10 mg total) by mouth daily. 08/17/2016: [place] [facility] [place] [facility] [place] notes, dose was increased to 2040m Pt needs to verify dose  . Magnesium 500 MG TABS Take by mouth.   . Multiple Vitamins-Minerals (MENS 50+ MULTI VITAMIN/MIN PO) Take 1 tablet by mouth daily.   . vitamin C (ASCORBIC ACID) 500 MG tablet Take 500 mg by mouth daily.   . eszopiclone (LUNESTA) 2 MG TABS tablet Take 1 tablet (2 mg total) by mouth at bedtime as needed for sleep. Take immediately before bedtime (Patient not taking: Reported on 08/17/2016) 02/28/2015: Only if traveling  . metFORMIN (GLUCOPHAGE) 500 MG tablet Take 500 mg by mouth daily with breakfast. 08/17/2016: Takes prn dietary indiscretion (2 times in the last 6 months); gets [facility] [place] [DISCONTINUED] furosemide (LASIX) 20 MG tablet Take 1 tablet (20 mg total) by  mouth as directed. (Patient not taking: Reported on 08/17/2016) 05/07/2016: Used for 3 day once  only (with fluid overload)  . [DISCONTINUED] metFORMIN (GLUCOPHAGE-XR) 500 MG 24 hr tablet Take 1 tablet (500 mg total) by mouth daily with breakfast. (Patient not taking: Reported on 08/17/2016)    Facility-Administered Encounter Medications as of 08/17/2016  Medication  . influenza  inactive virus vaccine (FLUZONE/FLUARIX) injection 0.5 mL   Pt confirmed after visit that he is actually taking 55m lipitor and 237mlisinopril, as [place] [facility] [place] [facility]erg[facility]  Allergen Reactions  . Horse-Derived Products Anaphylaxis    REACTION: Anaphlactic Reaction.  Can't take tetanus shot  . Pravastatin Other (See Comments)    Joint pain   . Adhesive [Tape] Itching and Rash    ROS: The patient denies anorexia, fever, headaches,  ear pain, hoarseness, chest pain, palpitations, dizziness, syncope, dyspnea on exertion, cough, swelling, nausea, vomiting, diarrhea, constipation, abdominal pain, melena, hematochezia, indigestion/heartburn, hematuria, incontinence, erectile dysfunction, nocturia, weakened urine stream, dysuria, genital lesions, joint pains, numbness, tingling, weakness, tremor, suspicious skin lesions, depression, anxiety, abnormal bleeding/bruising, or enlarged lymph nodes. Denies edema. Denies URI symptoms, cough. Denies fatigue. Slight constipation, relieved by daily fiber tablet. See HPI 10# weight gain since last visit in November (less active; home scale broke) Some knee discomfort (above the knees bilaterally), right shoulder, pain in both feet, between first and 2nd toes.    PHYSICAL EXAM:  BP (!) 148/76 (BP Location: Left Arm, Patient Position: Sitting, Cuff Size: Normal)   Pulse 64   Ht 6' 0.5" (1.842 m)   Wt 213 lb 3.2 oz (96.7 kg)   BMI 28.52 kg/m   132/74 on repeat by MD  General Appearance:    Alert, cooperative, no distress, appears stated age  Head:    Normocephalic, without obvious abnormality, atraumatic  Eyes:    PERRL, conjunctiva/corneas clear, EOM's  intact, fundi    benign  Ears:    Normal TM's and external ear canals  Nose:   Nares normal, mucosa is normal, no erythema or     purulence, no drainage or sinus tenderness  Throat:   Lips, mucosa, and tongue normal; teeth and gums normal  Neck:   Supple, no lymphadenopathy;  thyroid:  no   enlargement/tenderness/nodules; no carotid bruit or JVD  Back:    Spine nontender, no curvature, ROM normal, no CVA   tenderness  Lungs:     Clear to auscultation bilaterally without wheezes, rales or   ronchi; respirations unlabored  Chest Wall:    No tenderness or deformity   Heart:    Regular rate and rhythm, S1 and S2 normal, no murmur, rub   or gallop  Breast Exam:    No chest wall tenderness, masses or gynecomastia  Abdomen:     Soft, non-tender, nondistended, normoactive bowel sounds,    no masses, no hepatosplenomegaly  Genitalia:    Normal male external genitalia without lesions.  Testicles without masses.  No inguinal hernias.  Rectal:    Normal sphincter tone, no masses or tenderness; guaiac negative stool.  Prostate smooth, no nodules, not enlarged.  Extremities:   No clubbing, cyanosis or edema  Pulses:   2+ and symmetric all extremities  Skin:   Skin color, texture, turgor normal, no rashes or lesions  Lymph nodes:   Cervical, supraclavicular, and axillary nodes normal  Neurologic:   CNII-XII intact, normal strength, sensation and gait; reflexes   2+ and symmetric throughout  Psych:  Normal mood, affect, hygiene and grooming   Diabetic foot exam: Normal--very calloused and dry feet  Heme negative stool   ASSESSMENT/PLAN:  Annual physical exam - Plan: TSH, CBC with Differential/Platelet, PSA, Glucose, random  Medicare annual wellness visit, subsequent  Controlled type 2 diabetes mellitus without complication, without long-term current use of insulin ([place]) - essentially diet-controlled (rarely takes metformin) - Plan: TSH, Glucose,  random  Essential hypertension, benign - well controlled on current regimen (later confirmed to be 47m lipitor)  Pure hypercholesterolemia - lipids at goal on lipitor 475m Nonischemic cardiomyopathy (HCC)  Screening for prostate cancer - Plan: PSA   Glu, CBC, PSA, TSH   Colonoscopy past due--[facility] and [facility].  Will contact usKoreaor local referral, if needed.  Shingrix recommended when available   Confirm doses of lipitor and lisinopril (should be 4095mnd 50m38mr notes [facility] [place] [place] results).--pt called and confirmed these doses.   I [facility] about equilibrium/balance since getting the hearing aids.  You might benefit from physical therapy to help prevent falls.    Discussed PSA screening (risks/benefits), recommended at least 30 minutes of aerobic activity at least 5 days/week; proper sunscreen use reviewed; healthy diet and alcohol recommendations (less than or equal to 2 drinks/day) reviewed; regular seatbelt use; changing batteries in smoke detectors. Immunization recommendations discussed--UTD. Allergic to Tetanus. Colonoscopy recommendations reviewed, due.   Medicare Attestation I have personally reviewed: The patient's medical and social history Their use of alcohol, tobacco or illicit drugs Their current medications and supplements The patient's functional ability including ADLs,fall risks, home safety risks, cognitive, and hearing and visual impairment Diet and physical activities Evidence for depression or mood disorders  The patient's weight, height, and BMI have been recorded in the chart.  I have made referrals, counseling, and provided education to the patient based on review of the above and I have provided the patient with a written personalized care plan for preventive services.     Akylah Hascall A, MD   08/16/2016

## 2016-08-17 ENCOUNTER — Ambulatory Visit (INDEPENDENT_AMBULATORY_CARE_PROVIDER_SITE_OTHER): Payer: Medicare HMO | Admitting: Family Medicine

## 2016-08-17 ENCOUNTER — Encounter: Payer: Self-pay | Admitting: Family Medicine

## 2016-08-17 ENCOUNTER — Telehealth: Payer: Self-pay | Admitting: Family Medicine

## 2016-08-17 VITALS — BP 132/74 | HR 64 | Ht 72.5 in | Wt 213.2 lb

## 2016-08-17 DIAGNOSIS — E7800 Pure hypercholesterolemia, unspecified: Secondary | ICD-10-CM

## 2016-08-17 DIAGNOSIS — I428 Other cardiomyopathies: Secondary | ICD-10-CM

## 2016-08-17 DIAGNOSIS — Z125 Encounter for screening for malignant neoplasm of prostate: Secondary | ICD-10-CM | POA: Diagnosis not present

## 2016-08-17 DIAGNOSIS — E119 Type 2 diabetes mellitus without complications: Secondary | ICD-10-CM | POA: Diagnosis not present

## 2016-08-17 DIAGNOSIS — I10 Essential (primary) hypertension: Secondary | ICD-10-CM

## 2016-08-17 DIAGNOSIS — Z0000 Encounter for general adult medical examination without abnormal findings: Secondary | ICD-10-CM | POA: Diagnosis not present

## 2016-08-17 LAB — CBC WITH DIFFERENTIAL/PLATELET
Basophils Absolute: 0 cells/uL (ref 0–200)
Basophils Relative: 0 %
Eosinophils Absolute: 100 cells/uL (ref 15–500)
Eosinophils Relative: 2 %
HCT: 44.1 % (ref 38.5–50.0)
Hemoglobin: 14.5 g/dL (ref 13.2–17.1)
Lymphocytes Relative: 21 %
Lymphs Abs: 1050 cells/uL (ref 850–3900)
MCH: 30.7 pg (ref 27.0–33.0)
MCHC: 32.9 g/dL (ref 32.0–36.0)
MCV: 93.4 fL (ref 80.0–100.0)
MPV: 8.9 fL (ref 7.5–12.5)
Monocytes Absolute: 700 cells/uL (ref 200–950)
Monocytes Relative: 14 %
Neutro Abs: 3150 cells/uL (ref 1500–7800)
Neutrophils Relative %: 63 %
Platelets: 146 10*3/uL (ref 140–400)
RBC: 4.72 MIL/uL (ref 4.20–5.80)
RDW: 13.8 % (ref 11.0–15.0)
WBC: 5 10*3/uL (ref 4.0–10.5)

## 2016-08-17 LAB — GLUCOSE, RANDOM: Glucose, Bld: 113 mg/dL — ABNORMAL HIGH (ref 65–99)

## 2016-08-17 LAB — PSA: PSA: 0.5 ng/mL (ref <=4.0)

## 2016-08-17 LAB — TSH: TSH: 2 mIU/L (ref 0.40–4.50)

## 2016-08-17 NOTE — Patient Instructions (Addendum)
  HEALTH MAINTENANCE RECOMMENDATIONS:  It is recommended that you get at least 30 minutes of aerobic exercise at least 5 days/week (for weight loss, you may need as much as 60-90 minutes). This can be any activity that gets your heart rate up. This can be divided in 10-15 minute intervals if needed, but try and build up your endurance at least once a week.  Weight bearing exercise is also recommended twice weekly.  Eat a healthy diet with lots of vegetables, fruits and fiber.  "Colorful" foods have a lot of vitamins (ie green vegetables, tomatoes, red peppers, etc).  Limit sweet tea, regular sodas and alcoholic beverages, all of which has a lot of calories and sugar.  Up to 2 alcoholic drinks daily may be beneficial for men (unless trying to lose weight, watch sugars).  Drink a lot of water.  Sunscreen of at least SPF 30 should be used on all sun-exposed parts of the skin when outside between the hours of 10 am and 4 pm (not just when at beach or pool, but even with exercise, golf, tennis, and yard work!)  Use a sunscreen that says "broad spectrum" so it covers both UVA and UVB rays, and make sure to reapply every 1-2 hours.  Remember to change the batteries in your smoke detectors when changing your clock times in the spring and fall.  Use your seat belt every time you are in a car, and please drive safely and not be distracted with cell phones and texting while driving.   Alexander Curtis , Thank you for taking time to come for your Medicare Wellness Visit. I appreciate your ongoing commitment to your health goals. Please review the following plan we discussed and let me know if I can assist you in the future.   These are the goals we discussed: Goals    None      This is a list of the screening recommended for you and due dates:  Health Maintenance  Topic Date Due  .  Hepatitis C: One time screening is recommended by Center for Disease Control  (CDC) for  adults born from [birth year] through 1966.    11-Jun-1946  . Eye exam for diabetics  07/02/1956  . Complete foot exam   01/28/2016  . Colon Cancer Screening  05/28/2016  . Hemoglobin A1C  12/31/2016  . Flu Shot  Completed  . Shingles Vaccine  Completed  . Pneumonia vaccines  Completed   You had hepatitis C screen through the [place]. You get your diabetic eye exams through the [place].  Have them forward [place] copies at your next visit. Please get your colonoscopy--this is due now.  I recommend getting the new shingles vaccine (Shingrix) when available. You will need to check with your insurance to see if it is covered, and if covered by Medicare Part D, you need to get from the pharmacy rather than our office.  It is a series of 2 injections, spaced 2 months apart.   Confirm doses of lipitor and lisinopril (should be 40mg  and 20mg  per notes from the [place] doctor with your lab results).  I recommend following up with your ENT at the [facility] regarding your concern about equilibrium/balance since getting the hearing aids.  You might benefit from physical therapy to help prevent falls.

## 2016-08-17 NOTE — Telephone Encounter (Signed)
Pt called and said to tell you Atorvastatin 40 mg and Lisinopril 20 mg and that he see Dr. Dierdre Searles at the [facility].

## 2016-08-17 NOTE — Telephone Encounter (Signed)
Please update his med list in system

## 2016-08-17 NOTE — Telephone Encounter (Signed)
Done

## 2016-08-18 ENCOUNTER — Encounter: Payer: Self-pay | Admitting: Family Medicine

## 2016-08-26 ENCOUNTER — Encounter: Payer: Self-pay | Admitting: Family Medicine

## 2016-09-04 ENCOUNTER — Ambulatory Visit (INDEPENDENT_AMBULATORY_CARE_PROVIDER_SITE_OTHER): Payer: Medicare HMO | Admitting: *Deleted

## 2016-09-04 ENCOUNTER — Telehealth: Payer: Self-pay | Admitting: Family Medicine

## 2016-09-04 ENCOUNTER — Encounter: Payer: Self-pay | Admitting: Cardiology

## 2016-09-04 DIAGNOSIS — I5022 Chronic systolic (congestive) heart failure: Secondary | ICD-10-CM | POA: Diagnosis not present

## 2016-09-04 DIAGNOSIS — Z950 Presence of cardiac pacemaker: Secondary | ICD-10-CM | POA: Diagnosis not present

## 2016-09-04 DIAGNOSIS — Z1211 Encounter for screening for malignant neoplasm of colon: Secondary | ICD-10-CM

## 2016-09-04 DIAGNOSIS — I441 Atrioventricular block, second degree: Secondary | ICD-10-CM

## 2016-09-04 NOTE — Telephone Encounter (Signed)
Pt requesting referral for colonoscopy. Pt said he already discussed with Dr Tomi Bamberger that [place] found blood in his stool but he was checking to see how soon [facility] could get [facility]. [facility] in [place] [facility] not do it until [facility] & [place] [facility] can do it in 60 days but that is a little far to drive.

## 2016-09-04 NOTE — Telephone Encounter (Signed)
Referral entered  

## 2016-09-04 NOTE — Progress Notes (Signed)
Remote pacemaker transmission.   

## 2016-09-04 NOTE — Progress Notes (Signed)
EPIC Encounter for ICM Monitoring  Patient Name: Alexander Curtis is a 70 y.o. male Date: 09/04/2016 Primary Care Physican: Vikki Ports, MD Primary Cardiologist:Ross Electrophysiologist: Allred Dry Weight:unknown Bi-V Pacing: 98%        Heart Failure questions reviewed, pt asymptomatic.   Thoracic impedance normal.  Not prescribed a diuretic  Recommendations: No changes. Reminded to limit dietary salt intake to 2000 mg/day and fluid intake to < 2 liters/day. Encouraged to call for fluid symptoms.  Follow-up plan: ICM clinic phone appointment on 10/05/2016.  Will   Copy of ICM check sent to primary cardiologist and device physician.   3 month ICM trend: 09/04/2016   1 Year ICM trend:      [name], RN 09/04/2016 1:24 PM

## 2016-09-06 NOTE — Progress Notes (Signed)
Cardiology Office Note   Date:  09/07/2016   ID:  Alexander, Curtis 1945/10/09, MRN [id number]  PCP:  Vikki Ports, MD  Cardiologist:   Dorris Carnes, MD   Pt presents for f/u of systlic CHF     History of Present Illness: Alexander Curtis is a 71 y.o. male with a history of high degree AV block   S/P PPM 2011  Echo I n 2016 LVEF 35 to 40%  Upgraded to BiV pacer.  Echo repeat LVEF 45 to 50%   Also followed by J Allred I saw the pt in Feb 2017  No CP No SOB  No dizziness No syncope NO Edema   Current Meds  Medication Sig  . aspirin 81 MG tablet Take 81 mg by mouth every other day.  Marland Kitchen atorvastatin (LIPITOR) 40 MG tablet Take 40 mg by mouth daily.  Marland Kitchen b complex vitamins capsule Take 1 capsule by mouth daily.  . carvedilol (COREG) 6.25 MG tablet TAKE 1 TABLET(6.25 MG) BY MOUTH TWICE DAILY WITH A MEAL  . CINNAMON PO Take 2,000 mg by mouth 3 (three) times daily.   . Coenzyme Q10 (COQ10) 200 MG CAPS Take 1 capsule by mouth 2 (two) times daily.   . eszopiclone (LUNESTA) 2 MG TABS tablet Take 1 tablet (2 mg total) by mouth at bedtime as needed for sleep. Take immediately before bedtime  . fish oil-omega-3 fatty acids 1000 MG capsule Take 1 g by mouth 3 (three) times daily.   Nyoka Cowden Tea 250 MG CAPS Take 1 capsule by mouth daily.  Marland Kitchen ibuprofen (ADVIL,MOTRIN) 200 MG tablet Take 600 mg by mouth at bedtime.  Marland Kitchen lisinopril (PRINIVIL,ZESTRIL) 20 MG tablet Take 20 mg by mouth daily.  . Magnesium 500 MG TABS Take 500 mg by mouth daily.   . metFORMIN (GLUCOPHAGE) 500 MG tablet Take 500 mg by mouth daily as needed (dietary indiscretion).   . Multiple Vitamins-Minerals (MENS 50+ MULTI VITAMIN/MIN PO) Take 1 tablet by mouth daily.  . vitamin C (ASCORBIC ACID) 500 MG tablet Take 500 mg by mouth daily.     Allergies:   Horse-derived products; Pravastatin; and Adhesive [tape]   Past Medical History:  Diagnosis Date  . Atrial enlargement, left   . Diabetes mellitus, type 2 ([place])   . HLD  (hyperlipidemia)   . Hypertension 05/2013  . LBBB (left bundle branch block)   . Melanoma ([place]) 1968   R arm  . Mitral regurgitation   . Mobitz type 2 second degree heart block    a. s/p Medtronic Adapta L model ADDRL 1 (serial number [id number]) pacemaker. 07/02/14 b.  upgrade to STJ CRTP 02/2015  . Non-ischemic cardiomyopathy ([place])    a. felt to be 2/2 RV pacing  . Obstructive sleep apnea    compliant with CPAP  . Pulmonary nodules    noted on CT of shoulder 03/2016, up to 27mm in size on CT chest 04/2016. repeat 1 year due to h/o melanoma  . Syncope 2011   a. 2011 -negative workup except LBBB. thought to be vasovagal  . Tricuspid regurgitation     Past Surgical History:  Procedure Laterality Date  . EP IMPLANTABLE DEVICE N/A 03/01/2015   STJ CRTP upgrade by Dr Rayann Heman  . FINGER TENDON REPAIR     R 2nd and 3rd finger  . INGUINAL HERNIA REPAIR     bilateral  . KNEE ARTHROSCOPY  02/2011   L knee for meniscal tear. Dr.  Ninfa Linden  . PERMANENT PACEMAKER INSERTION N/A 07/01/2014   MDT ADDRL1 pacemaker implanted for Mobitz II Dr Rayann Heman  . UVULECTOMY     laser treatment for sleep apnea (NOT UP3)  . VASECTOMY       Social History:  The patient  reports that he has never smoked. He has never used smokeless tobacco. He reports that he drinks alcohol. He reports that he does not use drugs.   Family History:  The patient's family history includes Cancer in his mother; Dementia in his mother and paternal aunt; Diabetes in his maternal grandmother and sister; Heart attack in his maternal grandfather; Heart disease in his maternal grandfather; Melanoma (age of onset: 65) in his mother; Parkinson's disease in his maternal aunt; Stroke (age of onset: 52) in his father.    ROS:  Please see the history of present illness. All other systems are reviewed and  Negative to the above problem except as noted.    PHYSICAL EXAM: VS:  BP 130/72   Pulse 66   Ht 6' 0.5" (1.842 m)   Wt 209 lb 1.9 oz  (94.9 kg)   BMI 27.97 kg/m   GEN: Well nourished, well developed, in no acute distress  HEENT: normal  Neck: no JVD, carotid bruits, or masses Cardiac: RRR; no murmurs, rubs, or gallops,no edema  Respiratory:  clear to auscultation bilaterally, normal work of breathing GI: soft, nontender, nondistended, + BS  No hepatomegaly  MS: no deformity Moving all extremities   Skin: warm and dry, no rash Neuro:  Strength and sensation are intact Psych: euthymic mood, full affect   EKG:  EKG is not ordered today.   Lipid Panel    Component Value Date/Time   CHOL 127 09/02/2015 0001   TRIG 65 09/02/2015 0001   HDL 40 09/02/2015 0001   CHOLHDL 3.2 09/02/2015 0001   VLDL 13 09/02/2015 0001   LDLCALC 74 09/02/2015 0001      Wt Readings from Last 3 Encounters:  09/07/16 209 lb 1.9 oz (94.9 kg)  08/17/16 213 lb 3.2 oz (96.7 kg)  06/01/16 203 lb 12.8 oz (92.4 kg)      ASSESSMENT AND PLAN:  1  Chronic systolic CHF  Mild LV dysfunciton on last echo  Hx biV pacer   Volume is good  Keep on same meds  2  S/p BiV PPM  Follow with J ALlred  3  HL  Continue statin    F/U in 1 year     Current medicines are reviewed at length with the patient today.  The patient does not have concerns regarding medicines.  Signed, Dorris Carnes, MD  09/07/2016 12:03 PM    Alexander Curtis, [place], [place]  [address] Phone: [phone]; Fax: [phone]

## 2016-09-07 ENCOUNTER — Encounter: Payer: Self-pay | Admitting: Internal Medicine

## 2016-09-07 ENCOUNTER — Ambulatory Visit (INDEPENDENT_AMBULATORY_CARE_PROVIDER_SITE_OTHER): Payer: Medicare HMO | Admitting: Internal Medicine

## 2016-09-07 VITALS — BP 130/72 | HR 66 | Ht 72.5 in | Wt 209.1 lb

## 2016-09-07 DIAGNOSIS — I10 Essential (primary) hypertension: Secondary | ICD-10-CM | POA: Diagnosis not present

## 2016-09-07 DIAGNOSIS — Z950 Presence of cardiac pacemaker: Secondary | ICD-10-CM | POA: Diagnosis not present

## 2016-09-07 DIAGNOSIS — I441 Atrioventricular block, second degree: Secondary | ICD-10-CM | POA: Diagnosis not present

## 2016-09-07 DIAGNOSIS — I428 Other cardiomyopathies: Secondary | ICD-10-CM

## 2016-09-07 NOTE — Patient Instructions (Signed)
Your physician recommends that you continue on your current medications as directed. Please refer to the Current Medication list given to you today. Your physician wants you to follow-up in: 1 year with Dr. Ross.  You will receive a reminder letter in the mail two months in advance. If you don't receive a letter, please call our office to schedule the follow-up appointment.  

## 2016-09-08 ENCOUNTER — Encounter: Payer: Self-pay | Admitting: Cardiology

## 2016-09-08 LAB — CUP PACEART REMOTE DEVICE CHECK
Battery Remaining Longevity: 91 mo
Battery Remaining Percentage: 95.5 %
Battery Voltage: 2.98 V
Brady Statistic AP VP Percent: 23 %
Brady Statistic AP VS Percent: 1 %
Brady Statistic AS VP Percent: 75 %
Brady Statistic AS VS Percent: 1.4 %
Brady Statistic RA Percent Paced: 23 %
Date Time Interrogation Session: 20180223070015
Implantable Lead Implant Date: 20151220
Implantable Lead Implant Date: 20151220
Implantable Lead Implant Date: 20160819
Implantable Lead Location: 753858
Implantable Lead Location: 753859
Implantable Lead Location: 753860
Implantable Lead Model: 5076
Implantable Lead Model: 5076
Implantable Pulse Generator Implant Date: 20160819
Lead Channel Impedance Value: 400 Ohm
Lead Channel Impedance Value: 400 Ohm
Lead Channel Impedance Value: 430 Ohm
Lead Channel Pacing Threshold Amplitude: 0.5 V
Lead Channel Pacing Threshold Amplitude: 0.625 V
Lead Channel Pacing Threshold Amplitude: 0.75 V
Lead Channel Pacing Threshold Pulse Width: 0.5 ms
Lead Channel Pacing Threshold Pulse Width: 0.5 ms
Lead Channel Pacing Threshold Pulse Width: 0.8 ms
Lead Channel Sensing Intrinsic Amplitude: 2.3 mV
Lead Channel Sensing Intrinsic Amplitude: 3.8 mV
Lead Channel Setting Pacing Amplitude: 1.5 V
Lead Channel Setting Pacing Amplitude: 2 V
Lead Channel Setting Pacing Amplitude: 2 V
Lead Channel Setting Pacing Pulse Width: 0.5 ms
Lead Channel Setting Pacing Pulse Width: 0.8 ms
Lead Channel Setting Sensing Sensitivity: 1.5 mV
Pulse Gen Model: 3262
Pulse Gen Serial Number: 3099689

## 2016-09-15 DIAGNOSIS — R195 Other fecal abnormalities: Secondary | ICD-10-CM | POA: Diagnosis not present

## 2016-09-15 DIAGNOSIS — Z950 Presence of cardiac pacemaker: Secondary | ICD-10-CM | POA: Diagnosis not present

## 2016-10-05 ENCOUNTER — Ambulatory Visit (INDEPENDENT_AMBULATORY_CARE_PROVIDER_SITE_OTHER): Payer: Medicare HMO

## 2016-10-05 DIAGNOSIS — I5022 Chronic systolic (congestive) heart failure: Secondary | ICD-10-CM

## 2016-10-05 DIAGNOSIS — Z950 Presence of cardiac pacemaker: Secondary | ICD-10-CM | POA: Diagnosis not present

## 2016-10-05 NOTE — Progress Notes (Signed)
EPIC Encounter for ICM Monitoring  Patient Name: Alexander Curtis is a 71 y.o. male Date: 10/05/2016 Primary Care Physican: Vikki Ports, MD Primary Cardiologist:Ross Electrophysiologist: Allred Dry Weight:unknown Bi-V Pacing: 98%       Heart Failure questions reviewed, pt asymptomatic.   Thoracic impedance normal.  Not prescribed a diuretic  Recommendations: No changes. Reminded to limit dietary salt intake to 2000 mg/day and fluid intake to < 2 liters/day. Encouraged to call for fluid symptoms.  Follow-up plan: ICM clinic phone appointment on 11/10/2016.  Copy of ICM check sent to device physician.   3 month ICM trend: 10/05/2016   1 Year ICM trend:      [name], RN 10/05/2016 10:22 AM

## 2016-10-14 DIAGNOSIS — K5730 Diverticulosis of large intestine without perforation or abscess without bleeding: Secondary | ICD-10-CM | POA: Diagnosis not present

## 2016-10-14 DIAGNOSIS — R195 Other fecal abnormalities: Secondary | ICD-10-CM | POA: Diagnosis not present

## 2016-10-14 DIAGNOSIS — K5520 Angiodysplasia of colon without hemorrhage: Secondary | ICD-10-CM | POA: Diagnosis not present

## 2016-10-14 DIAGNOSIS — K648 Other hemorrhoids: Secondary | ICD-10-CM | POA: Diagnosis not present

## 2016-10-14 LAB — HM COLONOSCOPY

## 2016-10-15 ENCOUNTER — Telehealth (INDEPENDENT_AMBULATORY_CARE_PROVIDER_SITE_OTHER): Payer: Self-pay | Admitting: Orthopaedic Surgery

## 2016-10-15 NOTE — Telephone Encounter (Signed)
I CALLED PATIENT AND LEFT MESSAGE ADVISING COPY OF RECORDS RELATING TO RT HAND ( AS HE REQUESTED) ARE READY TO PICK TO PICK.

## 2016-11-07 IMAGING — CR DG CHEST 2V
2 series · 2 of 2 positions shown · non-contrast
Comparison: Chest radiograph 07/02/2014

CLINICAL DATA: Patient with shortness of breath.  No chest pain.

EXAM:
CHEST  2 VIEW

[chest pa]
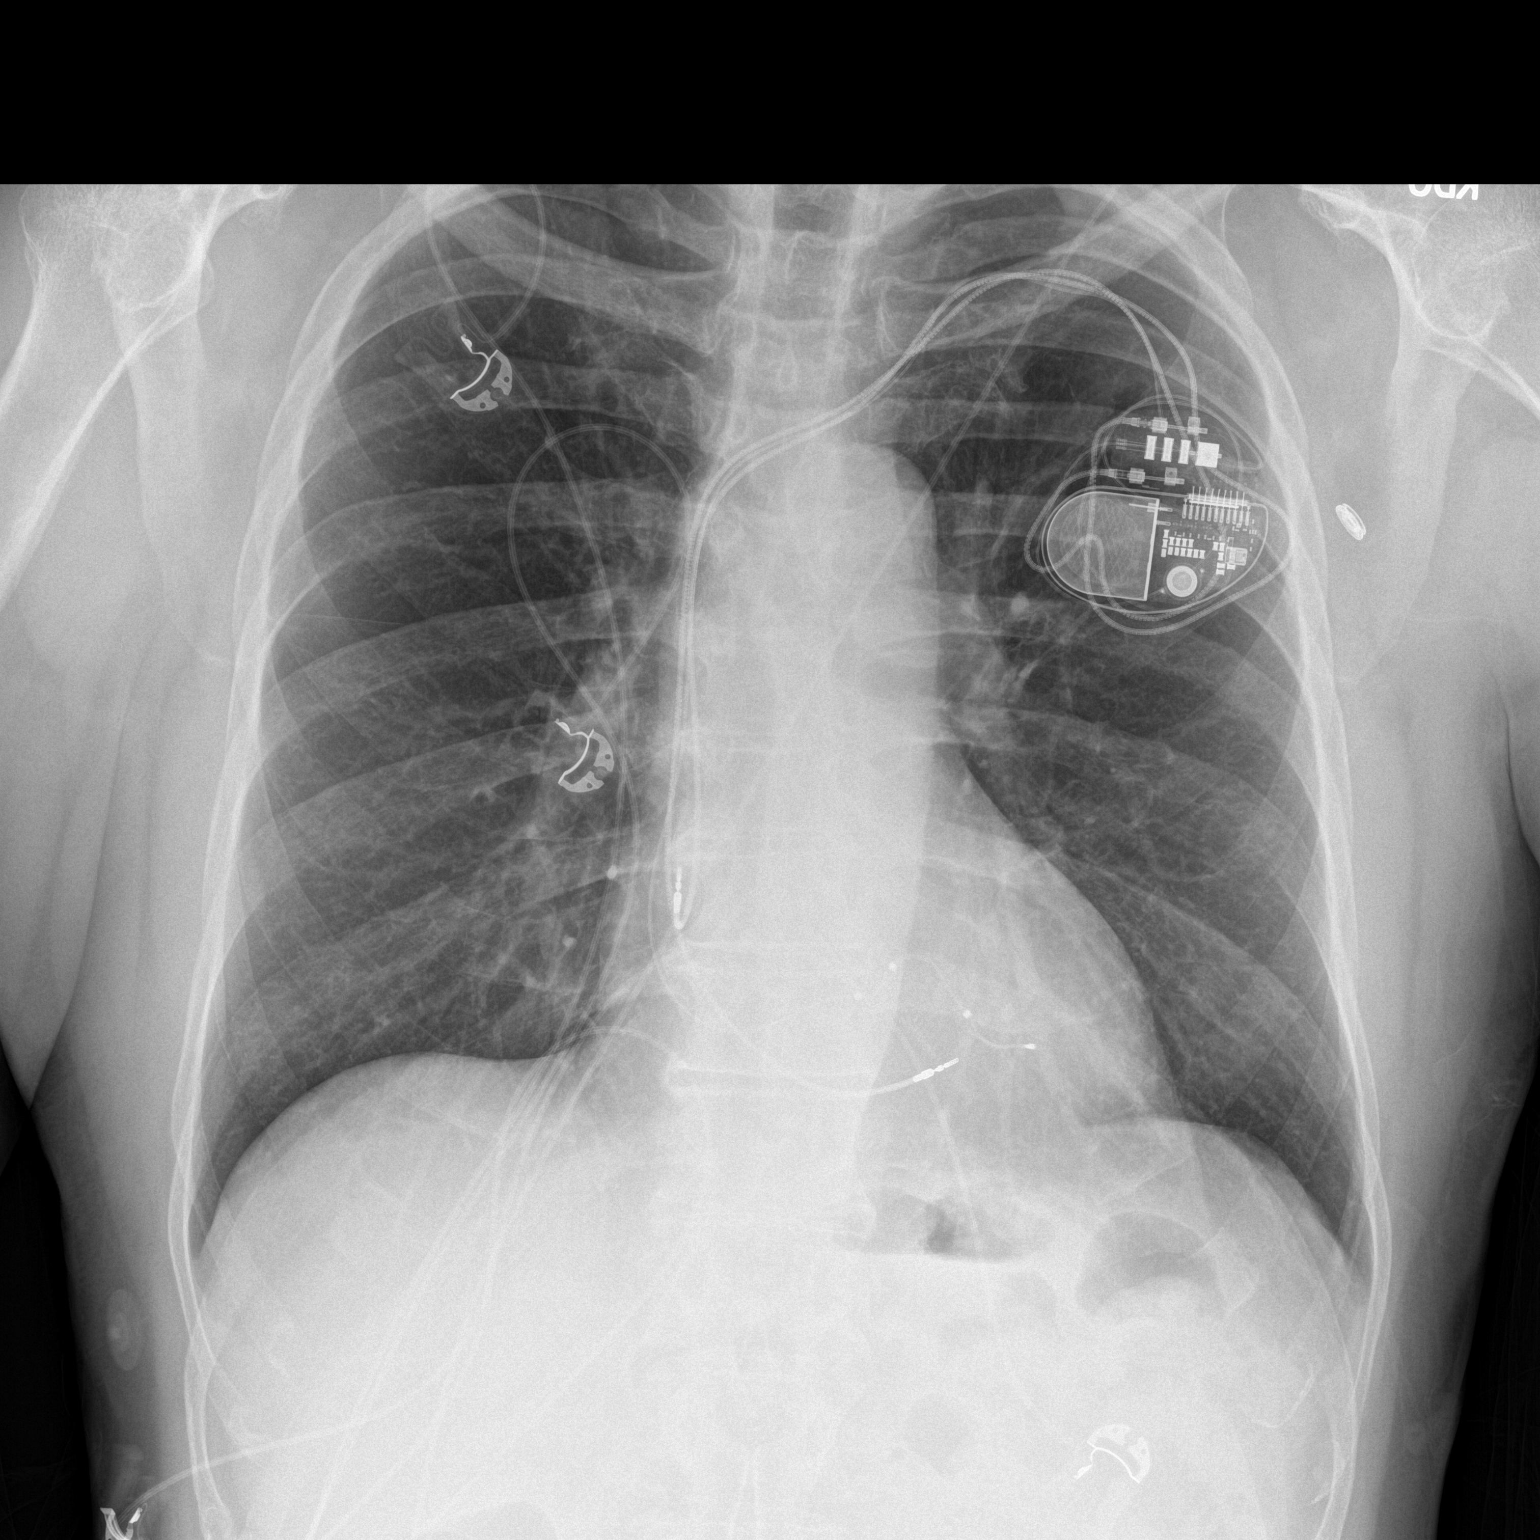

[chest lat]
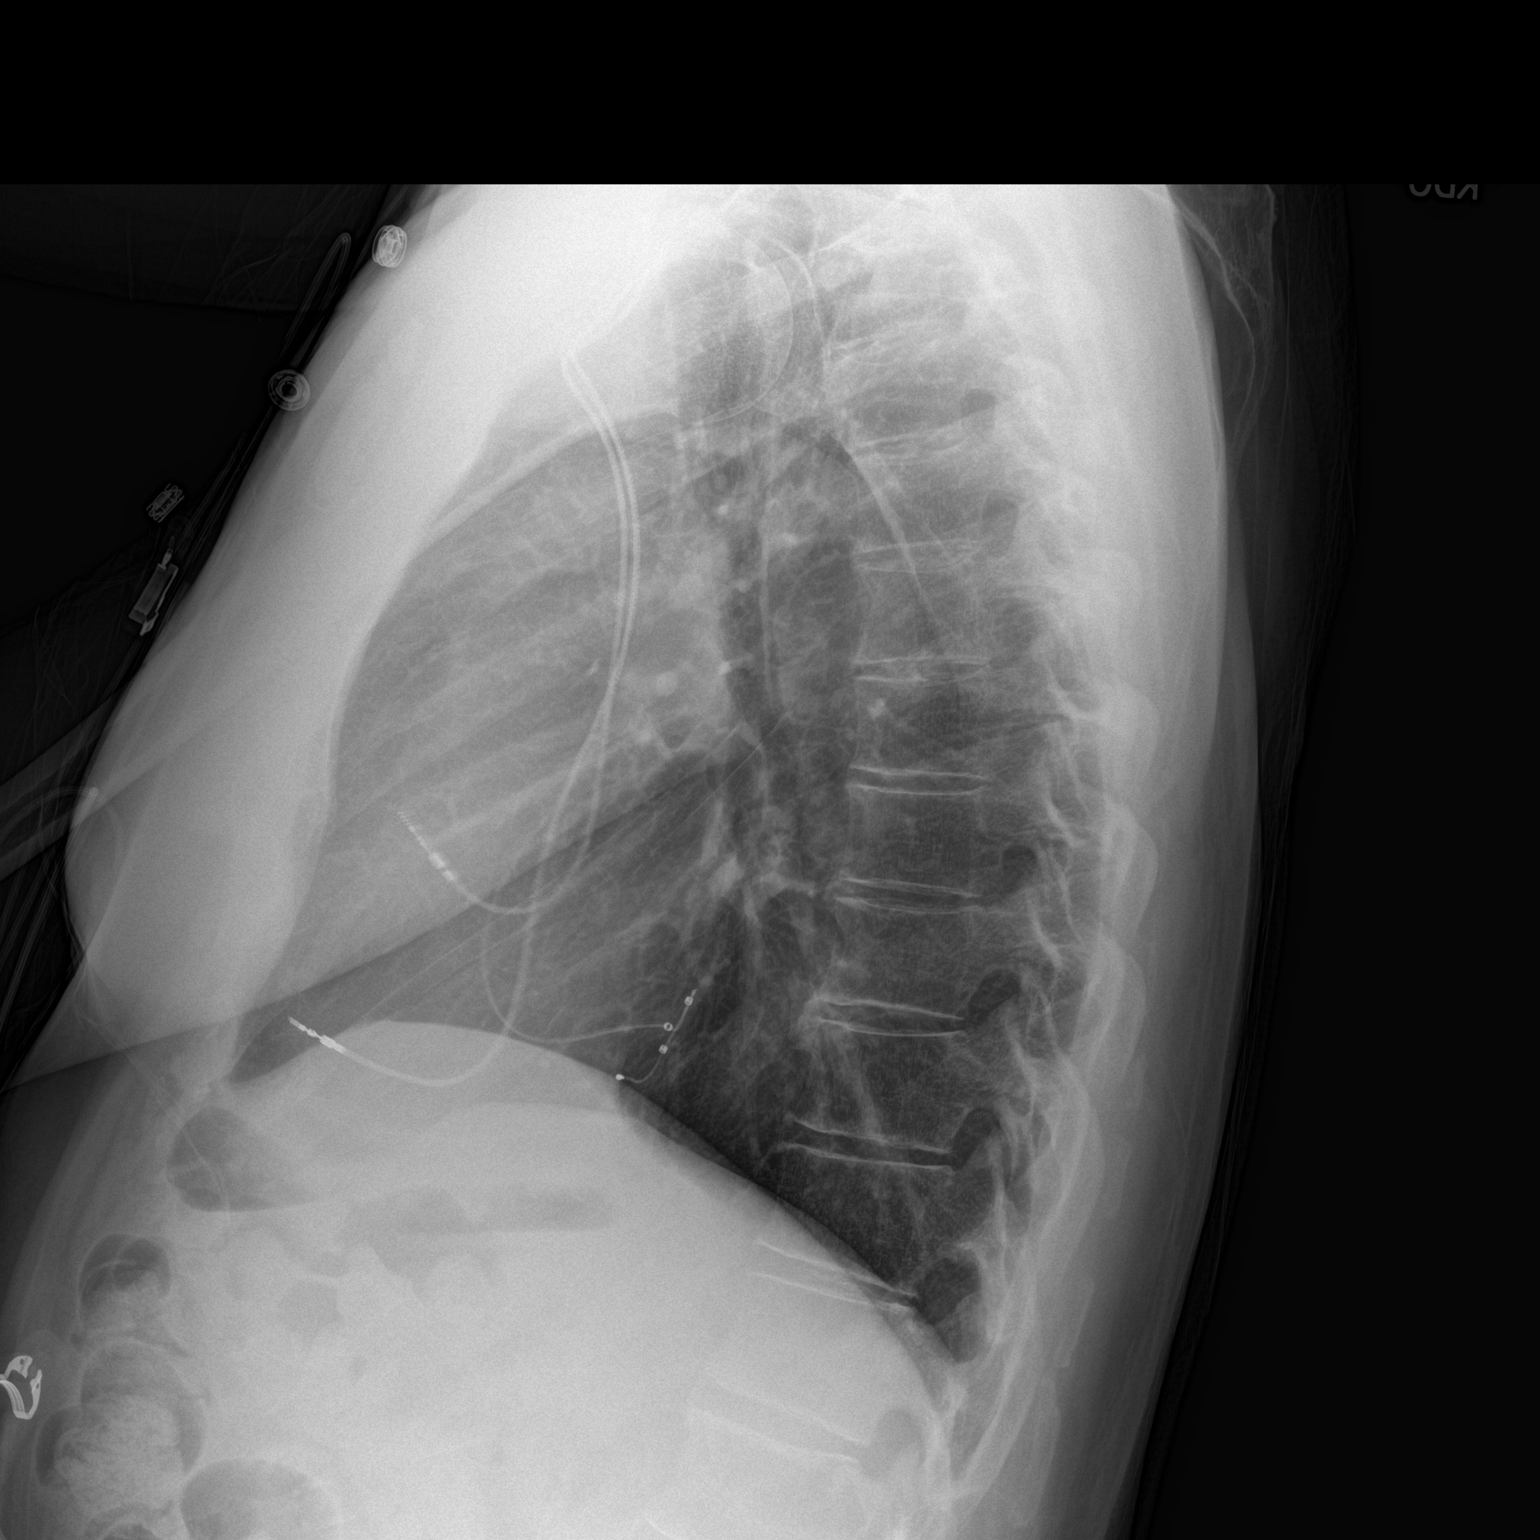

[2 of 2 positions shown; findings below may reference images not displayed]

FINDINGS: Multiple monitoring leads overlie the patient. 3 lead pacer
apparatus is present, leads appear in appropriate position. Stable
cardiac and mediastinal contours. No consolidative pulmonary
opacities. No pleural effusion or pneumothorax. Regional skeleton is
unremarkable.
IMPRESSION: 3 lead pacer apparatus, leads appear in appropriate position.

No acute cardiopulmonary process.

## 2016-11-10 ENCOUNTER — Ambulatory Visit (INDEPENDENT_AMBULATORY_CARE_PROVIDER_SITE_OTHER): Payer: Medicare HMO

## 2016-11-10 DIAGNOSIS — I5022 Chronic systolic (congestive) heart failure: Secondary | ICD-10-CM

## 2016-11-10 DIAGNOSIS — Z950 Presence of cardiac pacemaker: Secondary | ICD-10-CM | POA: Diagnosis not present

## 2016-11-12 ENCOUNTER — Telehealth: Payer: Self-pay

## 2016-11-12 NOTE — Progress Notes (Signed)
EPIC Encounter for ICM Monitoring  Patient Name: DEAKEN JURGENS is a 71 y.o. male Date: 11/12/2016 Primary Care Physican: Vikki Ports, MD Primary Cardiologist:Ross Electrophysiologist: Allred Dry Weight:unknown Bi-V Pacing: 98%       Attempted call to patient and unable to reach.  Left detailed message regarding transmission.  Transmission reviewed.    Thoracic impedance normal   Not prescribed a diuretic  Recommendations: Left voice mail with ICM number and encouraged to call for fluid symptoms.  Follow-up plan: ICM clinic phone appointment on 12/14/2016.   Copy of ICM check sent to device physician.   3 month ICM trend: 11/10/2016   1 Year ICM trend:      [name], RN 11/12/2016 3:46 PM

## 2016-11-12 NOTE — Telephone Encounter (Signed)
Remote ICM transmission received.  Attempted patient call and left detailed message regarding transmission and next ICM scheduled for 12/14/2016.  Advised to return call for any fluid symptoms or questions.    

## 2016-12-02 ENCOUNTER — Encounter: Payer: Self-pay | Admitting: Family Medicine

## 2016-12-14 ENCOUNTER — Ambulatory Visit (INDEPENDENT_AMBULATORY_CARE_PROVIDER_SITE_OTHER): Payer: Medicare HMO | Admitting: *Deleted

## 2016-12-14 DIAGNOSIS — Z950 Presence of cardiac pacemaker: Secondary | ICD-10-CM

## 2016-12-14 DIAGNOSIS — I5022 Chronic systolic (congestive) heart failure: Secondary | ICD-10-CM | POA: Diagnosis not present

## 2016-12-14 DIAGNOSIS — I441 Atrioventricular block, second degree: Secondary | ICD-10-CM

## 2016-12-14 NOTE — Progress Notes (Signed)
EPIC Encounter for ICM Monitoring  Patient Name: Alexander Curtis is a 71 y.o. male Date: 12/14/2016 Primary Care Physican: Rita Ohara, MD Primary Cardiologist:Ross Electrophysiologist: Allred Dry Weight:unknown Bi-V Pacing: 98%         Attempted call to patient and unable to reach.  Left detailed message regarding transmission.  Transmission reviewed.    Thoracic impedance normal.  Not prescribed a diuretic  Recommendations: Left voice mail with ICM number and encouraged to call for fluid symptoms.  Follow-up plan: ICM clinic phone appointment on 01/14/2017.    Copy of ICM check sent to device physician.   3 month ICM trend: 12/14/2016   1 Year ICM trend:      [name], RN 12/14/2016 2:40 PM

## 2016-12-14 NOTE — Progress Notes (Signed)
Patient called back and left voice mail message that he is doing well and denied any fluid symptoms.  He stated the next date for ICM transmission, 01/14/2017 will be fine.

## 2016-12-14 NOTE — Progress Notes (Signed)
Remote pacemaker transmission.   

## 2016-12-16 LAB — CUP PACEART REMOTE DEVICE CHECK
Battery Remaining Longevity: 87 mo
Battery Remaining Percentage: 95.5 %
Battery Voltage: 2.98 V
Brady Statistic AP VP Percent: 23 %
Brady Statistic AP VS Percent: 1 %
Brady Statistic AS VP Percent: 75 %
Brady Statistic AS VS Percent: 1.4 %
Brady Statistic RA Percent Paced: 23 %
Date Time Interrogation Session: 20180604060032
Implantable Lead Implant Date: 20151220
Implantable Lead Implant Date: 20151220
Implantable Lead Implant Date: 20160819
Implantable Lead Location: 753858
Implantable Lead Location: 753859
Implantable Lead Location: 753860
Implantable Lead Model: 5076
Implantable Lead Model: 5076
Implantable Pulse Generator Implant Date: 20160819
Lead Channel Impedance Value: 380 Ohm
Lead Channel Impedance Value: 390 Ohm
Lead Channel Impedance Value: 430 Ohm
Lead Channel Pacing Threshold Amplitude: 0.5 V
Lead Channel Pacing Threshold Amplitude: 0.625 V
Lead Channel Pacing Threshold Amplitude: 0.875 V
Lead Channel Pacing Threshold Pulse Width: 0.5 ms
Lead Channel Pacing Threshold Pulse Width: 0.5 ms
Lead Channel Pacing Threshold Pulse Width: 0.8 ms
Lead Channel Sensing Intrinsic Amplitude: 2.8 mV
Lead Channel Sensing Intrinsic Amplitude: 3.8 mV
Lead Channel Setting Pacing Amplitude: 1.5 V
Lead Channel Setting Pacing Amplitude: 2 V
Lead Channel Setting Pacing Amplitude: 2 V
Lead Channel Setting Pacing Pulse Width: 0.5 ms
Lead Channel Setting Pacing Pulse Width: 0.8 ms
Lead Channel Setting Sensing Sensitivity: 1.5 mV
Pulse Gen Model: 3262
Pulse Gen Serial Number: 3099689

## 2016-12-22 ENCOUNTER — Encounter: Payer: Self-pay | Admitting: Cardiology

## 2017-01-14 ENCOUNTER — Ambulatory Visit (INDEPENDENT_AMBULATORY_CARE_PROVIDER_SITE_OTHER): Payer: Medicare HMO

## 2017-01-14 DIAGNOSIS — Z950 Presence of cardiac pacemaker: Secondary | ICD-10-CM | POA: Diagnosis not present

## 2017-01-14 DIAGNOSIS — I5022 Chronic systolic (congestive) heart failure: Secondary | ICD-10-CM

## 2017-01-14 NOTE — Progress Notes (Signed)
EPIC Encounter for ICM Monitoring  Patient Name: Alexander Curtis is a 71 y.o. male Date: 01/14/2017 Primary Care Physican: Rita Ohara, MD Primary Cardiologist:Ross Electrophysiologist: Allred Dry Weight:199 lbs Bi-V Pacing: 98%      Heart Failure questions reviewed, pt asymptomatic.   Thoracic impedance normal but was abnormal suggesting fluid accumulation from 12/20/2016 to 12/28/2016.  No diuretic  Recommendations: No changes.  Encouraged to call for fluid symptoms.  Follow-up plan: ICM clinic phone appointment on 02/16/2017.    Copy of ICM check sent to device physician.   3 month ICM trend: 01/14/2017   1 Year ICM trend:      [name], RN 01/14/2017 10:00 AM

## 2017-01-18 DIAGNOSIS — R69 Illness, unspecified: Secondary | ICD-10-CM | POA: Diagnosis not present

## 2017-01-21 ENCOUNTER — Telehealth: Payer: Self-pay | Admitting: Family Medicine

## 2017-01-21 DIAGNOSIS — E1142 Type 2 diabetes mellitus with diabetic polyneuropathy: Secondary | ICD-10-CM

## 2017-01-21 NOTE — Telephone Encounter (Signed)
Alexander Curtis dropped off his progress notes from the [place] for you to look at, put in your folder for your review, he can be reached at [phone]

## 2017-01-24 DIAGNOSIS — E1142 Type 2 diabetes mellitus with diabetic polyneuropathy: Secondary | ICD-10-CM

## 2017-01-24 HISTORY — DX: Type 2 diabetes mellitus with diabetic polyneuropathy: E11.42

## 2017-01-24 NOTE — Telephone Encounter (Signed)
Notes and labs reviewed. labs 12/31/16 glu 108, A1c 6.3, LDL 43, HDL 52, TG 43, urine microal/cr 6.2, nl C-met and CBC diabetic neuropathy; gabapentin dose increased to 100/100/300

## 2017-01-25 ENCOUNTER — Encounter: Payer: Self-pay | Admitting: *Deleted

## 2017-02-16 ENCOUNTER — Encounter: Payer: Self-pay | Admitting: Family Medicine

## 2017-02-16 ENCOUNTER — Telehealth: Payer: Self-pay

## 2017-02-16 ENCOUNTER — Ambulatory Visit (INDEPENDENT_AMBULATORY_CARE_PROVIDER_SITE_OTHER): Payer: Medicare HMO

## 2017-02-16 DIAGNOSIS — I5022 Chronic systolic (congestive) heart failure: Secondary | ICD-10-CM

## 2017-02-16 DIAGNOSIS — Z950 Presence of cardiac pacemaker: Secondary | ICD-10-CM | POA: Diagnosis not present

## 2017-02-16 NOTE — Telephone Encounter (Signed)
Attempted patient call and left message to send remote transmission

## 2017-02-18 NOTE — Telephone Encounter (Signed)
Patient returning call and says he sent transmission .

## 2017-02-19 NOTE — Progress Notes (Signed)
EPIC Encounter for ICM Monitoring  Patient Name: Alexander Curtis is a 71 y.o. male Date: 02/19/2017 Primary Care Physican: Rita Ohara, MD Primary Cardiologist:Ross Electrophysiologist: Allred Dry Weight:Previous ICM weight 199 lbs Bi-V Pacing: 98%           Spoke with wife. Heart Failure questions reviewed, pt asymptomatic.   Thoracic impedance normal but was abnormal suggesting fluid accumulation from 01/20/17 to 02/11/17 with exception of a few days.  No diuretic  Recommendations: No changes.    Encouraged to call for fluid symptoms.  Follow-up plan: ICM clinic phone appointment on 03/22/2017.    Copy of ICM check sent to device physician.   3 month ICM trend: 02/19/2017   1 Year ICM trend:      [name], RN 02/19/2017 12:34 PM

## 2017-03-22 ENCOUNTER — Ambulatory Visit (INDEPENDENT_AMBULATORY_CARE_PROVIDER_SITE_OTHER): Payer: Medicare HMO | Admitting: *Deleted

## 2017-03-22 DIAGNOSIS — Z950 Presence of cardiac pacemaker: Secondary | ICD-10-CM

## 2017-03-22 DIAGNOSIS — I5022 Chronic systolic (congestive) heart failure: Secondary | ICD-10-CM | POA: Diagnosis not present

## 2017-03-22 DIAGNOSIS — I441 Atrioventricular block, second degree: Secondary | ICD-10-CM

## 2017-03-22 NOTE — Progress Notes (Signed)
Remote pacemaker transmission.   

## 2017-03-22 NOTE — Progress Notes (Signed)
EPIC Encounter for ICM Monitoring  Patient Name: Alexander Curtis is a 71 y.o. male Date: 03/22/2017 Primary Care Physican: Rita Ohara, MD Primary Cardiologist:Ross Electrophysiologist: Allred Dry Weight:199 lbs Bi-V Pacing: 98%         Spoke with wife. Heart Failure questions reviewed, pt asymptomatic.   Thoracic impedance normal but was abnormal suggesting fluid accumulation from 03/14/2017 to 03/20/2017 which correlates with being on vacation in [place].  No symptoms.  No diuretic  Recommendations: No changes.   Encouraged to call for fluid symptoms.  Follow-up plan: ICM clinic phone appointment on 04/22/2017.    Copy of ICM check sent to Dr. Rayann Heman.   3 month ICM trend: 03/22/2017   1 Year ICM trend:      [name], RN 03/22/2017 2:47 PM

## 2017-03-24 ENCOUNTER — Encounter: Payer: Self-pay | Admitting: Cardiology

## 2017-03-25 LAB — CUP PACEART REMOTE DEVICE CHECK
Date Time Interrogation Session: 20180913124838
Implantable Lead Implant Date: 20151220
Implantable Lead Implant Date: 20151220
Implantable Lead Implant Date: 20160819
Implantable Lead Location: 753858
Implantable Lead Location: 753859
Implantable Lead Location: 753860
Implantable Lead Model: 5076
Implantable Lead Model: 5076
Implantable Pulse Generator Implant Date: 20160819
Lead Channel Setting Pacing Amplitude: 1.5 V
Lead Channel Setting Pacing Amplitude: 2 V
Lead Channel Setting Pacing Amplitude: 2 V
Lead Channel Setting Pacing Pulse Width: 0.5 ms
Lead Channel Setting Pacing Pulse Width: 0.8 ms
Lead Channel Setting Sensing Sensitivity: 1.5 mV
Pulse Gen Model: 3262
Pulse Gen Serial Number: 3099689

## 2017-04-22 ENCOUNTER — Ambulatory Visit (INDEPENDENT_AMBULATORY_CARE_PROVIDER_SITE_OTHER): Payer: Medicare HMO

## 2017-04-22 DIAGNOSIS — Z950 Presence of cardiac pacemaker: Secondary | ICD-10-CM | POA: Diagnosis not present

## 2017-04-22 DIAGNOSIS — I5022 Chronic systolic (congestive) heart failure: Secondary | ICD-10-CM | POA: Diagnosis not present

## 2017-04-22 NOTE — Progress Notes (Signed)
EPIC Encounter for ICM Monitoring  Patient Name: [name] is a 71 y.o. male Date: 04/22/2017 Primary Care Physican: Rita Ohara, MD Primary Cardiologist:Ross Electrophysiologist: Allred Dry Weight:Previous weight 199 lbs Bi-V Pacing: 98%       Attempted call to patient and unable to reach.  Left detailed message regarding transmission.  Transmission reviewed.    Thoracic impedance normal.  No diuretic  Recommendations: Left voice mail with ICM number and encouraged to call if experiencing any fluid symptoms.  Follow-up plan: ICM clinic phone appointment on 05/24/2017.  Office appointment scheduled 06/02/2017 with Dr. Rayann Heman.  Copy of ICM check sent to Dr. Rayann Heman.   3 month ICM trend: 04/22/2017   1 Year ICM trend:      [name], RN 04/22/2017 10:14 AM

## 2017-04-23 ENCOUNTER — Telehealth: Payer: Self-pay

## 2017-04-23 NOTE — Telephone Encounter (Signed)
Remote ICM transmission received.  Attempted call to patient on home and cell phone.  Home currently not working. Left detailed message regarding transmission and next ICM scheduled for 05/24/2017.  Advised to return call for any fluid symptoms or questions.

## 2017-05-24 ENCOUNTER — Encounter: Payer: Self-pay | Admitting: Internal Medicine

## 2017-05-24 ENCOUNTER — Telehealth: Payer: Self-pay | Admitting: Cardiology

## 2017-05-24 NOTE — Telephone Encounter (Signed)
LMOVM reminding pt to send remote transmission.   

## 2017-06-01 ENCOUNTER — Telehealth: Payer: Self-pay

## 2017-06-01 NOTE — Telephone Encounter (Signed)
Returned call to patient as requested.  He said his home address changed to [address] [place], [place] [address] and phone number is [phone].  He was on vacation when the ICM transmission was due.  He has defib check in office tomorrow with Dr Rayann Heman.  Advised will reschedule ICM remote transmission for 07/08/2017 and encouraged to call for any changes.

## 2017-06-02 ENCOUNTER — Encounter (INDEPENDENT_AMBULATORY_CARE_PROVIDER_SITE_OTHER): Payer: Self-pay

## 2017-06-02 ENCOUNTER — Encounter: Payer: Self-pay | Admitting: Internal Medicine

## 2017-06-02 ENCOUNTER — Ambulatory Visit: Payer: Medicare HMO | Admitting: Internal Medicine

## 2017-06-02 VITALS — BP 132/68 | HR 70 | Ht 72.5 in | Wt 208.0 lb

## 2017-06-02 DIAGNOSIS — I441 Atrioventricular block, second degree: Secondary | ICD-10-CM | POA: Diagnosis not present

## 2017-06-02 DIAGNOSIS — I428 Other cardiomyopathies: Secondary | ICD-10-CM

## 2017-06-02 DIAGNOSIS — Z950 Presence of cardiac pacemaker: Secondary | ICD-10-CM

## 2017-06-02 LAB — CUP PACEART INCLINIC DEVICE CHECK
Date Time Interrogation Session: 20181121115103
Implantable Lead Implant Date: 20151220
Implantable Lead Implant Date: 20151220
Implantable Lead Implant Date: 20160819
Implantable Lead Location: 753858
Implantable Lead Location: 753859
Implantable Lead Location: 753860
Implantable Lead Model: 5076
Implantable Lead Model: 5076
Implantable Pulse Generator Implant Date: 20160819
Pulse Gen Model: 3262
Pulse Gen Serial Number: 3099689

## 2017-06-02 NOTE — Progress Notes (Signed)
PCP: Rita Ohara, MD Primary Cardiologist:  Dr Harrington Challenger Primary EP:  Dr Rayann Heman  Alexander Curtis is a 71 y.o. male who presents today for routine electrophysiology followup.  Since last being seen in our clinic, the patient reports doing very well.  Today, he denies symptoms of palpitations, chest pain, shortness of breath,  lower extremity edema, dizziness, presyncope, or syncope.  The patient is otherwise without complaint today.   Past Medical History:  Diagnosis Date  . Atrial enlargement, left   . Diabetes mellitus, type 2 ([place])   . HLD (hyperlipidemia)   . Hypertension 05/2013  . LBBB (left bundle branch block)   . Melanoma ([place]) 1968   R arm  . Mitral regurgitation   . Mobitz type 2 second degree heart block    a. s/p Medtronic Adapta L model ADDRL 1 (serial number [id number]) pacemaker. 07/02/14 b.  upgrade to STJ CRTP 02/2015  . Non-ischemic cardiomyopathy ([place])    a. felt to be 2/2 RV pacing  . Obstructive sleep apnea    compliant with CPAP  . Pulmonary nodules    noted on CT of shoulder 03/2016, up to 12mm in size on CT chest 04/2016. repeat 1 year due to h/o melanoma  . Syncope 2011   a. 2011 -negative workup except LBBB. thought to be vasovagal  . Tricuspid regurgitation    Past Surgical History:  Procedure Laterality Date  . EP IMPLANTABLE DEVICE N/A 03/01/2015   STJ CRTP upgrade by Dr Rayann Heman  . FINGER TENDON REPAIR     R 2nd and 3rd finger  . INGUINAL HERNIA REPAIR     bilateral  . KNEE ARTHROSCOPY  02/2011   L knee for meniscal tear. [name]  . PERMANENT PACEMAKER INSERTION N/A 07/01/2014   MDT ADDRL1 pacemaker implanted for Mobitz II Dr Rayann Heman  . UVULECTOMY     laser treatment for sleep apnea (NOT UP3)  . VASECTOMY      ROS- all systems are reviewed and negative except as per HPI above  Current Outpatient Medications  Medication Sig Dispense Refill  . aspirin 81 MG tablet Take 81 mg by mouth every other day.    Marland Kitchen atorvastatin (LIPITOR) 40 MG tablet  Take 40 mg by mouth daily.    Marland Kitchen b complex vitamins capsule Take 1 capsule by mouth daily.    . carvedilol (COREG) 6.25 MG tablet TAKE 1 TABLET(6.25 MG) BY MOUTH TWICE DAILY WITH A MEAL 180 tablet 1  . CINNAMON PO Take 2,000 mg by mouth 3 (three) times daily.     . Coenzyme Q10 (COQ10) 200 MG CAPS Take 1 capsule by mouth 2 (two) times daily.     . fish oil-omega-3 fatty acids 1000 MG capsule Take 1 g by mouth 3 (three) times daily.     Marland Kitchen GABAPENTIN PO Take 1 tablet by mouth 3 (three) times daily. Pt unsure of strenght    . lisinopril (PRINIVIL,ZESTRIL) 20 MG tablet Take 20 mg by mouth daily.    . Magnesium 500 MG TABS Take 500 mg by mouth daily.     . metFORMIN (GLUCOPHAGE) 500 MG tablet Take 500 mg by mouth daily as needed (dietary indiscretion).     . Multiple Vitamins-Minerals (MENS 50+ MULTI VITAMIN/MIN PO) Take 1 tablet by mouth daily.    . vitamin C (ASCORBIC ACID) 500 MG tablet Take 500 mg by mouth daily.     No current facility-administered medications for this visit.  Facility-Administered Medications Ordered in Other Visits  Medication Dose Route Frequency Provider Last Rate Last Dose  . influenza  inactive virus vaccine (FLUZONE/FLUARIX) injection 0.5 mL  0.5 mL Intramuscular Once Rita Ohara, MD        Physical Exam: Vitals:   06/02/17 0953  BP: 132/68  Pulse: 70  SpO2: 95%  Weight: 208 lb (94.3 kg)  Height: 6' 0.5" (1.842 m)    GEN- The patient is well appearing, alert and oriented x 3 today.   Head- normocephalic, atraumatic Eyes-  Sclera clear, conjunctiva pink Ears- hearing intact Oropharynx- clear Lungs- Clear to ausculation bilaterally, normal work of breathing Chest- pacemaker pocket is well healed Heart- Regular rate and rhythm, no murmurs, rubs or gallops, PMI not laterally displaced GI- soft, NT, ND, + BS Extremities- no clubbing, cyanosis, or edema  Pacemaker interrogation- reviewed in detail today,  See PACEART report  ekg tracing ordered today is  personally reviewed and shows sinus with V pacing  Assessment and Plan:  1. Symptomatic mobitz II second degree AV block Today, 1:1 conducting underlying rhythm Normal pacemaker function See Pace Art report No changes today  2. OSA Severe LA enlargement Compliance with CPAP encouraged Short and rare disorganized atrial arrhythmias observed on PPM. No sustained afib  3. Nonischemic CM/ chronic systolic dysfunction Stable Followed in ICM device clinic with Sharman Cheek No change required today  4. HTN Stable No change required today  Merlin Return to see EP NP in a year  [name] MD, [facility] 06/02/2017 10:17 AM

## 2017-06-02 NOTE — Patient Instructions (Addendum)
Medication Instructions:  Your physician recommends that you continue on your current medications as directed. Please refer to the Current Medication list given to you today.  -- If you need a refill on your cardiac medications before your next appointment, please call your pharmacy. --  Labwork: None ordered  Testing/Procedures: None ordered  Follow-Up:  Remote monitoring is used to monitor your Pacemaker from home. This monitoring reduces the number of office visits required to check your device to one time per year. It allows [place] to keep an eye on the functioning of your device to ensure it is working properly. You are scheduled for a device check from home on 07/08/2017. You may send your transmission at any time that day. If you have a wireless device, the transmission will be sent automatically. After your physician reviews your transmission, you will receive a postcard with your next transmission date.   Your physician wants you to follow-up in: 1 year with Chanetta Marshall NP.    You will receive a reminder letter in the mail two months in advance. If you don't receive a letter, please call our office to schedule the follow-up appointment.  Thank you for choosing [facility]!!   Frederik Schmidt, RN [phone]  Any Other Special Instructions Will Be Listed Below (If Applicable).

## 2017-07-08 ENCOUNTER — Ambulatory Visit (INDEPENDENT_AMBULATORY_CARE_PROVIDER_SITE_OTHER): Payer: Medicare HMO | Admitting: *Deleted

## 2017-07-08 DIAGNOSIS — Z950 Presence of cardiac pacemaker: Secondary | ICD-10-CM

## 2017-07-08 DIAGNOSIS — I441 Atrioventricular block, second degree: Secondary | ICD-10-CM | POA: Diagnosis not present

## 2017-07-08 DIAGNOSIS — I5022 Chronic systolic (congestive) heart failure: Secondary | ICD-10-CM

## 2017-07-08 NOTE — Progress Notes (Signed)
Remote pacemaker transmission.   

## 2017-07-08 NOTE — Progress Notes (Signed)
EPIC Encounter for ICM Monitoring  Patient Name: CAMRYN QUESINBERRY is a 71 y.o. male Date: 07/08/2017 Primary Care Physican: Rita Ohara, MD Primary Cardiologist:Ross Electrophysiologist: Allred Dry Weight:204 lbs Bi-V Pacing: 98%         Heart Failure questions reviewed, pt asymptomatic.   Thoracic impedance normal.  No diuretic  Recommendations: No changes.   Encouraged to call for fluid symptoms.  Follow-up plan: ICM clinic phone appointment on 08/09/2017.    Copy of ICM check sent to Dr. Rayann Heman.   3 month ICM trend: 07/08/2017    1 Year ICM trend:       [name], RN 07/08/2017 4:07 PM

## 2017-07-09 ENCOUNTER — Encounter: Payer: Self-pay | Admitting: Cardiology

## 2017-07-12 LAB — MICROALBUMIN, URINE: Microalb, Ur: 72

## 2017-07-12 LAB — HEMOGLOBIN A1C: Hemoglobin A1C: 6.3

## 2017-07-20 LAB — CUP PACEART REMOTE DEVICE CHECK
Battery Remaining Longevity: 82 mo
Battery Remaining Percentage: 95.5 %
Battery Voltage: 2.96 V
Brady Statistic AP VP Percent: 20 %
Brady Statistic AP VS Percent: 1 %
Brady Statistic AS VP Percent: 78 %
Brady Statistic AS VS Percent: 1.5 %
Brady Statistic RA Percent Paced: 20 %
Date Time Interrogation Session: 20181227070014
Implantable Lead Implant Date: 20151220
Implantable Lead Implant Date: 20151220
Implantable Lead Implant Date: 20160819
Implantable Lead Location: 753858
Implantable Lead Location: 753859
Implantable Lead Location: 753860
Implantable Lead Model: 5076
Implantable Lead Model: 5076
Implantable Pulse Generator Implant Date: 20160819
Lead Channel Impedance Value: 380 Ohm
Lead Channel Impedance Value: 390 Ohm
Lead Channel Impedance Value: 400 Ohm
Lead Channel Pacing Threshold Amplitude: 0.5 V
Lead Channel Pacing Threshold Amplitude: 0.75 V
Lead Channel Pacing Threshold Amplitude: 0.75 V
Lead Channel Pacing Threshold Pulse Width: 0.5 ms
Lead Channel Pacing Threshold Pulse Width: 0.5 ms
Lead Channel Pacing Threshold Pulse Width: 0.8 ms
Lead Channel Sensing Intrinsic Amplitude: 2.5 mV
Lead Channel Sensing Intrinsic Amplitude: 2.7 mV
Lead Channel Setting Pacing Amplitude: 1.5 V
Lead Channel Setting Pacing Amplitude: 2 V
Lead Channel Setting Pacing Amplitude: 2 V
Lead Channel Setting Pacing Pulse Width: 0.5 ms
Lead Channel Setting Pacing Pulse Width: 0.8 ms
Lead Channel Setting Sensing Sensitivity: 1.5 mV
Pulse Gen Model: 3262
Pulse Gen Serial Number: 3099689

## 2017-08-09 ENCOUNTER — Ambulatory Visit (INDEPENDENT_AMBULATORY_CARE_PROVIDER_SITE_OTHER): Payer: Medicare HMO

## 2017-08-09 DIAGNOSIS — Z950 Presence of cardiac pacemaker: Secondary | ICD-10-CM | POA: Diagnosis not present

## 2017-08-09 DIAGNOSIS — I5022 Chronic systolic (congestive) heart failure: Secondary | ICD-10-CM

## 2017-08-09 NOTE — Progress Notes (Signed)
EPIC Encounter for ICM Monitoring  Patient Name: Alexander Curtis is a 73 y.o. male Date: 08/09/2017 Primary Care Physican: Rita Ohara, MD Primary Cardiologist:Ross Electrophysiologist: Allred Dry Weight:Prior weight 204 lbs Bi-V Pacing: 98%        Heart Failure questions reviewed, pt asymptomatic.   Thoracic impedance normal.  No diuretic  Recommendations: No changes.   Encouraged to call for fluid symptoms.  Follow-up plan: ICM clinic phone appointment on 09/09/2017.  Office appointment scheduled 09/09/2017 with Dr. Harrington Challenger.  Copy of ICM check sent to Dr. Rayann Heman.   3 month ICM trend: 08/09/2017    1 Year ICM trend:       [name], RN 08/09/2017 1:27 PM

## 2017-08-19 ENCOUNTER — Ambulatory Visit: Payer: Medicare HMO | Admitting: Family Medicine

## 2017-08-24 DIAGNOSIS — R69 Illness, unspecified: Secondary | ICD-10-CM | POA: Diagnosis not present

## 2017-09-09 ENCOUNTER — Telehealth: Payer: Self-pay

## 2017-09-09 ENCOUNTER — Ambulatory Visit (INDEPENDENT_AMBULATORY_CARE_PROVIDER_SITE_OTHER): Payer: Medicare HMO

## 2017-09-09 ENCOUNTER — Encounter: Payer: Self-pay | Admitting: Internal Medicine

## 2017-09-09 ENCOUNTER — Ambulatory Visit: Payer: Medicare HMO | Admitting: Internal Medicine

## 2017-09-09 VITALS — BP 122/58 | HR 67 | Ht 72.5 in | Wt 214.8 lb

## 2017-09-09 DIAGNOSIS — I5022 Chronic systolic (congestive) heart failure: Secondary | ICD-10-CM

## 2017-09-09 DIAGNOSIS — Z950 Presence of cardiac pacemaker: Secondary | ICD-10-CM

## 2017-09-09 DIAGNOSIS — E782 Mixed hyperlipidemia: Secondary | ICD-10-CM

## 2017-09-09 NOTE — Progress Notes (Signed)
EPIC Encounter for ICM Monitoring  Patient Name: Alexander Curtis is a 72 y.o. male Date: 09/09/2017 Primary Care Physican: Rita Ohara, MD Primary Cardiologist:Ross Electrophysiologist: Allred Dry Weight:Prior weight 204lbs Bi-V Pacing: 98%      Attempted call to patient and unable to reach.  Left detailed message regarding transmission.  Transmission reviewed.    Thoracic impedance normal.  No diuretic  Recommendations:  Left voice mail with ICM number and encouraged to call if experiencing any fluid symptoms.  Follow-up plan: ICM clinic phone appointment on 10/11/2017.    Copy of ICM check sent to Dr. Harrington Challenger and Dr. Rayann Heman.   3 month ICM trend: 09/09/2017    1 Year ICM trend:       [name], RN 09/09/2017 7:59 AM

## 2017-09-09 NOTE — Progress Notes (Signed)
Cardiology Office Note   Date:  09/09/2017   ID:  Alexander Curtis 1946-02-23, MRN [id number]  PCP:  Rita Ohara, MD  Cardiologist:   Dorris Carnes, MD   Pt presents for follow up ofNICM      History of Present Illness: [place] Curtis is a 72 y.o. male with a history of high degree AV block   S/P PPM 2011  Echo I n 2016 LVEF 35 to 40%  NICM) Upgraded to BiV pacer.  Echo repeat LVEF 45 to 50%   Also followed by J Allred I saw the pt in Feb 2018  He is very active   Walks a 80 lb boxer 4x per day   No CP  No signif SOB  No edema Did have near syncope in fall  Had N/V  x2  ? GI bug    Hx vagal type rxn in past   Knows prodome  None since   Current Meds  Medication Sig  . aspirin 81 MG tablet Take 81 mg by mouth every other day.  Marland Kitchen atorvastatin (LIPITOR) 40 MG tablet Take 40 mg by mouth daily.  Marland Kitchen b complex vitamins capsule Take 1 capsule by mouth daily.  . carvedilol (COREG) 6.25 MG tablet TAKE 1 TABLET(6.25 MG) BY MOUTH TWICE DAILY WITH A MEAL  . CINNAMON PO Take 2,000 mg by mouth 3 (three) times daily.   . Coenzyme Q10 (COQ10) 200 MG CAPS Take 1 capsule by mouth 2 (two) times daily.   . fish oil-omega-3 fatty acids 1000 MG capsule Take 1 g by mouth 3 (three) times daily.   Marland Kitchen GABAPENTIN PO Take 1 tablet by mouth 3 (three) times daily. Pt unsure of strenght  . lisinopril (PRINIVIL,ZESTRIL) 20 MG tablet Take 20 mg by mouth daily.  . Magnesium 500 MG TABS Take 500 mg by mouth daily.   . metFORMIN (GLUCOPHAGE) 500 MG tablet Take 500 mg by mouth daily as needed (dietary indiscretion).   . Multiple Vitamins-Minerals (MENS 50+ MULTI VITAMIN/MIN PO) Take 1 tablet by mouth daily.  . vitamin C (ASCORBIC ACID) 500 MG tablet Take 500 mg by mouth daily.     Allergies:   Horse-derived products; Pravastatin; and Adhesive [tape]   Past Medical History:  Diagnosis Date  . Atrial enlargement, left   . Diabetes mellitus, type 2 ([place])   . HLD (hyperlipidemia)   . Hypertension 05/2013  . LBBB  (left bundle branch block)   . Melanoma ([place]) 1968   R arm  . Mitral regurgitation   . Mobitz type 2 second degree heart block    a. s/p Medtronic Adapta L model ADDRL 1 (serial number [id number]) pacemaker. 07/02/14 b.  upgrade to STJ CRTP 02/2015  . Non-ischemic cardiomyopathy ([place])    a. felt to be 2/2 RV pacing  . Obstructive sleep apnea    compliant with CPAP  . Pulmonary nodules    noted on CT of shoulder 03/2016, up to 81mm in size on CT chest 04/2016. repeat 1 year due to h/o melanoma  . Syncope 2011   a. 2011 -negative workup except LBBB. thought to be vasovagal  . Tricuspid regurgitation     Past Surgical History:  Procedure Laterality Date  . EP IMPLANTABLE DEVICE N/A 03/01/2015   STJ CRTP upgrade by Dr Rayann Heman  . FINGER TENDON REPAIR     R 2nd and 3rd finger  . INGUINAL HERNIA REPAIR     bilateral  . KNEE ARTHROSCOPY  02/2011   L knee for meniscal tear. [name]  . PERMANENT PACEMAKER INSERTION N/A 07/01/2014   MDT ADDRL1 pacemaker implanted for Mobitz II Dr Rayann Heman  . UVULECTOMY     laser treatment for sleep apnea (NOT UP3)  . VASECTOMY       Social History:  The patient  reports that  has never smoked. he has never used smokeless tobacco. He reports that he drinks alcohol. He reports that he does not use drugs.   Family History:  The patient's family history includes Cancer in his mother; Dementia in his mother and paternal aunt; Diabetes in his maternal grandmother and sister; Heart attack in his maternal grandfather; Heart disease in his maternal grandfather; Melanoma (age of onset: 51) in his mother; Parkinson's disease in his maternal aunt; Stroke (age of onset: 44) in his father.    ROS:  Please see the history of present illness. All other systems are reviewed and  Negative to the above problem except as noted.    PHYSICAL EXAM: VS:  BP (!) 122/58   Pulse 67   Ht 6' 0.5" (1.842 m)   Wt 214 lb 12.8 oz (97.4 kg)   SpO2 93%   BMI 28.73 kg/m   GEN:  Well nourished, well developed, in no acute distress  HEENT: normal  Neck: JVP normal  No carotid bruits, or masses Cardiac: RRR; no murmurs, rubs, or gallops,no edema  Respiratory:  clear to auscultation bilaterally, normal work of breathing GI: soft, nontender, nondistended, + BS  No hepatomegaly  MS: no deformity Moving all extremities   Skin: warm and dry, no rash Neuro:  Strength and sensation are intact Psych: euthymic mood, full affect   EKG:  EKG is not ordered today.   Lipid Panel    Component Value Date/Time   CHOL 127 09/02/2015 0001   TRIG 65 09/02/2015 0001   HDL 40 09/02/2015 0001   CHOLHDL 3.2 09/02/2015 0001   VLDL 13 09/02/2015 0001   LDLCALC 74 09/02/2015 0001      Wt Readings from Last 3 Encounters:  09/09/17 214 lb 12.8 oz (97.4 kg)  06/02/17 208 lb (94.3 kg)  09/07/16 209 lb 1.9 oz (94.9 kg)      ASSESSMENT AND PLAN:  1  Chronic systolic CHF  Mild LV dysfunciton on last echo  No s/p  biV pacer   Volume is good  Keep on same meds  CLass I symptoms    2  AV block  Initially s/p PPM  Then upgrade to BiV PPM  Follows with J ALlred   3  HL  Continue statin  LDL from [place] [facility]) in June 2018 was 43   Continue    F/U in 1 year     Current medicines are reviewed at length with the patient today.  The patient does not have concerns regarding medicines.  Signed, Dorris Carnes, MD  09/09/2017 10:05 AM    [place], [place]  [id number] Phone: [phone]; Fax: [phone]

## 2017-09-09 NOTE — Patient Instructions (Signed)
Your physician recommends that you continue on your current medications as directed. Please refer to the Current Medication list given to you today. Your physician wants you to follow-up in: 1 year with Dr. Harrington Challenger . You will receive a reminder letter in the mail two months in advance. If you don't receive a letter, please call our office to schedule the follow-up appointment. ca

## 2017-09-09 NOTE — Telephone Encounter (Signed)
Remote ICM transmission received.  Attempted call to patient and left detailed message per DPR regarding transmission and next ICM scheduled for 10/11/2017.  Advised to return call for any fluid symptoms or questions.    

## 2017-09-12 NOTE — Progress Notes (Signed)
Chief Complaint  Patient presents with  . Medicare Wellness    fasting AWV visit. Just has eye exam. Now had neuropathy of his fingers.     Alexander Curtis is a 72 y.o. male who presents for annual wellness visit and follow-up on chronic medical conditions.  He has the following concerns:  [facility] Followed by cardiology regularly, and here once yearly.  Hyperlipidemia: Patient has been taking lipitor 40mg regularly. He used to have some soreness in his upper arms/shoulders, but these resolved. He is trying to follow a lowfat, low cholesterol diet. He had labs checked 12/31 and brings in copies today: TC 115, HDL 54, LDL 53, TG 42. AST sl elevated at 38, rest of LFT's normal.  Diabetes--he was prescribed metformin back in October 2015, but didn't take it (no side effects, just didn't want to take). He finally started taking the medication this past summer, after he started having neuropathy in his feet.  He denies side effects. Blood sugars are usually <100 "if good", up to 139 as max, when not as careful with his diet.  Usually runs around 110 in the mornings. Doesn't check it other times of day.  Denies polydipsia, polyuria, hypoglycemia; [facility], due soon.   Neuropathy started in his toes, but since November has moved up to his hands. He admits to "seriously" taking neurontin in November when the tingling started in his hands.  He is supposed to be taking 100/100/300, he thought only 200mg at bedtime, so that's all he is taking (along with the 100mg the other two times of day), but it seems to help at this dose.  It is "annoying" when hands fall asleep related to sleep, this is when it occurs the most. He [facility]il[facility] and was referred to podiatry.  He saw Dr. Aljouny (podiatrist), diagnosed with a Morton's neuroma and diabetic neuropathy.  Couldn't get injection done outside of [facility] for insurance  reasons.  He is trying to get appt with [facility]rist [facility] to get the cortisone shot.  labs 07/12/17 fasting glucose 117, normal c-met (AST sl elevated at 38), A1c 6.3. Urine microalb/Cr ratio 7.2  Bradycardia (Mobitz type 2 second degree heart block)--s/p pacemaker insertion 06/2014, then upgraded to CRT-P device in 02/2015. He gets regular pacemaker checks, and is working well. No longer is having any fatigue or dyspnea on exertion.  CHF/cardiomyopathy: stable/controlled.  No longer on any diuretics. He denies edema, dyspnea, chest pain. He saw Dr. Ross last week. No longer needs to f/u with Dr. Allred.  Hypertension: BP's at home have been 120-130/69-78. Taking lisinopril 20mg daily. No headaches, dizziness, chest pain, cough or other side effects of medication.   2 months ago he was vomiting, got very dizzy, but didn't faint--kept his head down to void passing out.  He reports having a vagal response to vomiting, which causes syncope.  He had a GI illness, which was short-lived.  No ongoing issues.  Hearing loss: has hearing aids. 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   Sleep apnea--He is compliant with using CPAP and tolerating well without problems.   Right shoulder pain. s/p cortisone shot and PT, and is doing much better. Incidental finding of 5mm pulmonary nodule. Due to h/o melanoma, it was recommended he repeat the CT in 1 year.  He had the f/u   and found another nodule (5th nodule, per pt), so is now going q3 months for CT f/u, last one was last week, doesn't have the results yet.  Immunization History  Administered Date(s) Administered  . Influenza Split 05/02/2012  . Influenza, High Dose Seasonal PF 04/12/2014, 05/07/2016  . Influenza-Unspecified 03/28/2015  . Pneumococcal Conjugate-13 08/14/2015  . Pneumococcal Polysaccharide-23 03/28/2012  . Zoster 12/31/2011   allergic to tetanus  Last colonoscopy: 11/07; he reports  having another in March or April 2018 at [facility] (no records received), reportedly normal.  He believes he was told to f/u in 2 years. Last PSA: Lab Results  Component Value Date   PSA 0.5 08/17/2016   PSA 0.48 09/02/2015   PSA 0.52 07/16/2014   Dentist: twice yearly [facility]htho: [facility] Exercise: Walks dog (80 lb boxer) 5x per day, walks 1/2 mile, sniffs frequently, takes about 15-20 minutes. Recently moved, so did a lot of exercise and lifting, housework. Hepatitis C screen done at [facility] 01/2016  Other doctors caring for patient include: Cardiology: Dr. Ross, Dr. Allred Ophtho: at [facility] GI: [facility]--Dr. Brahmbhatt [facility] Dr. Lizardo Dentist: Dr. Beattie Dermatologist: at [facility], every 6 months (Dr. Goldstein?) Ortho: Dr. Blackman, Dr. Norris Podiatrist: Dr. Aljouny   Depression screen: Negative Fall screen: 1--slipped on wood stairs in his new house, missed the last step (was in socks; he now added lights to see the last step easier). Bruised R leg, had a hematoma. Functional status screen--notable for decreased vision from cataracts, decreased hearing (had testing at [facility], has bilateral hearing aids). See full screen in epic.  End of Life Discussion: Patient does not havea living will and medical power of attorney   Past Medical History:  Diagnosis Date  . Atrial enlargement, left   . Diabetes mellitus, type 2 ([facility])   . HLD (hyperlipidemia)   . Hypertension 05/2013  . LBBB (left bundle branch block)   . Melanoma ([facility]) 1968   R arm  . Mitral regurgitation   . Mobitz type 2 second degree heart block    a. s/p Medtronic Adapta L model ADDRL 1 (serial number [id number]) pacemaker. 07/02/14 b.  upgrade to STJ CRTP 02/2015  . Non-ischemic cardiomyopathy ([facility])    a. felt to be 2/2 RV pacing  . Obstructive sleep apnea    compliant with CPAP  . Pulmonary nodules    noted on CT of shoulder 03/2016, up to 5mm in size on CT chest 04/2016. repeat 1 year due to h/o melanoma  .  Syncope 2011   a. 2011 -negative workup except LBBB. thought to be vasovagal  . Tricuspid regurgitation     Past Surgical History:  Procedure Laterality Date  . EP IMPLANTABLE DEVICE N/A 03/01/2015   STJ CRTP upgrade by Dr Allred  . FINGER TENDON REPAIR     R 2nd and 3rd finger  . INGUINAL HERNIA REPAIR     bilateral  . KNEE ARTHROSCOPY  02/2011   L knee for meniscal tear. Dr. Blackman  . PERMANENT PACEMAKER INSERTION N/A 07/01/2014   MDT ADDRL1 pacemaker implanted for Mobitz II Dr Allred  . UVULECTOMY     laser treatment for sleep apnea (NOT UP3)  . VASECTOMY      Social History   Socioeconomic History  . Marital status: Married    Spouse name: Not on file  . Number of children: 4  . Years of education: Not on file  . Highest education level: Not on file    Social Needs  . Financial resource strain: Not on file  . Food insecurity - worry: Not on file  . Food insecurity - inability: Not on file  . Transportation needs - medical: Not on file  . Transportation needs - non-medical: Not on file  Occupational History  . Occupation: retired  Tobacco Use  . Smoking status: Never Smoker  . Smokeless tobacco: Never Used  Substance and Sexual Activity  . Alcohol use: Yes    Comment: 3-4 drinks per month.  . Drug use: No  . Sexual activity: Not Currently    Partners: Female  Other Topics Concern  . Not on file  Social History Narrative   Lives with wife, 1 dog.  Daughter, Junie Panning, in [place], 2 children in [place], son in [place].   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 Dementia Mother   . Cancer Mother        ?type, was told it contributed to death, per pt  . Melanoma Mother 32  . Stroke Father 92       cerebral aneurysm  . Diabetes Sister        borderline, obese  . Diabetes Maternal Grandmother   . Heart disease Maternal Grandfather   . Heart attack Maternal  Grandfather   . Dementia Paternal Aunt   . Parkinson's disease Maternal Aunt   . Hypertension Neg Hx     Outpatient Encounter Medications as of 09/14/2017  Medication Sig Note  . aspirin 81 MG tablet Take 81 mg by mouth every other day.   Marland Kitchen atorvastatin (LIPITOR) 40 MG tablet Take 40 mg by mouth daily.   Marland Kitchen b complex vitamins capsule Take 1 capsule by mouth daily.   . carvedilol (COREG) 6.25 MG tablet TAKE 1 TABLET(6.25 MG) BY MOUTH TWICE DAILY WITH A MEAL   . CINNAMON PO Take 2,000 mg by mouth 3 (three) times daily.    . Coenzyme Q10 (COQ10) 200 MG CAPS Take 1 capsule by mouth 2 (two) times daily.    . fish oil-omega-3 fatty acids 1000 MG capsule Take 1 g by mouth 3 (three) times daily.    Marland Kitchen GABAPENTIN PO Take 1 tablet by mouth 3 (three) times daily. Pt unsure of strenght 09/14/2017: 154m in morning, mid-day and taking 2076mqHS [place] [facility] [place] [place] [facility]  . lisinopril (PRINIVIL,ZESTRIL) 20 MG tablet Take 20 mg by mouth daily.   . Magnesium 500 MG TABS Take 500 mg by mouth daily.    . metFORMIN (GLUCOPHAGE) 500 MG tablet Take 500 mg by mouth daily as needed (dietary indiscretion).  09/14/2017: Takes one pill daily[facility]  . Multiple Vitamins-Minerals (MENS 50+ MULTI VITAMIN/MIN PO) Take 1 tablet by mouth daily.   . TURMERIC PO Take 1 tablet by mouth daily.   . vitamin C (ASCORBIC ACID) 500 MG tablet Take 500 mg by mouth daily.    Facility-Administered Encounter Medications as of 09/14/2017  Medication  . influenza  inactive virus vaccine (FLUZONE/FLUARIX) injection 0.5 mL    Allergies  Allergen Reactions  . Horse-Derived Products Anaphylaxis    REACTION: Anaphlactic Reaction.  Can't take tetanus shot  . Pravastatin Other (See Comments)    Joint pain   . Adhesive [Tape] Itching and Rash    ROS: The patient denies anorexia, fever,  weight changes, headaches,  vision loss (decreased from cataracts, stable), ear pain, hoarseness,  chest pain, palpitations, dizziness, syncope, dyspnea on exertion, cough, swelling, nausea, vomiting, diarrhea, constipation, abdominal pain, melena, hematochezia, indigestion/heartburn, hematuria, incontinence, erectile dysfunction, nocturia, weakened urine stream, dysuria, genital lesions, joint pains, numbness, tingling, weakness, tremor, suspicious skin lesions, depression, anxiety, abnormal bleeding/bruising, or enlarged lymph nodes No longer having dizziness/vertigo when he pitches forward as much as in the past. Tingling in toes and fingers, improved with neurontin, per HPI   PHYSICAL EXAM:  BP 136/86   Pulse 60   Ht 6' 0.75" (1.848 m)   Wt 216 lb 3.2 oz (98.1 kg)   BMI 28.72 kg/m    142/82 on repeat by MD  BP Readings from Last 3 Encounters:  09/14/17 136/86  09/09/17 (!) 122/58  06/02/17 132/68    Wt Readings from Last 3 Encounters:  09/14/17 216 lb 3.2 oz (98.1 kg)  09/09/17 214 lb 12.8 oz (97.4 kg)  06/02/17 208 lb (94.3 kg)    General Appearance:  Alert, cooperative, no distress, appears stated age  Head:  Normocephalic, without obvious abnormality, atraumatic  Eyes:  PERRL, conjunctiva/corneas clear, EOM's intact, fundi benign  Ears:  Normal TM's and external ear canals  Nose: Nares normal, mucosa is normal, no erythema or purulence, no drainage or sinus tenderness  Throat: Lips, mucosa, and tongue normal; teeth and gums normal  Neck: Supple, no lymphadenopathy; thyroid: no enlargement/ tenderness/nodules; no carotid bruit or JVD  Back:  Spine nontender, no curvature, ROM normal, no CVAtenderness  Lungs:  Clear to auscultation bilaterally without wheezes, rales or ronchi; respirations unlabored  Chest Wall:  No tenderness or deformity  Heart:  Regular rate and rhythm, S1 and S2 normal, no murmur, rub or gallop  Breast Exam:  No chest wall tenderness, masses or gynecomastia  Abdomen:  Soft, non-tender, nondistended, normoactive bowel  sounds, no masses, no hepatosplenomegaly  Genitalia:  Normal male external genitalia without lesions. Testicles without masses. Small, nontender, inguinal hernia noted on the right.  Rectal:  Normal sphincter tone, no masses or tenderness; guaiac negative stool. Prostate smooth, no nodules, not enlarged.  Extremities: No clubbing, cyanosis or edema; dry, calloused feet, with decreased monofilament sensation over callouses. Mildly +phalen--increased tingling in thumb only, neg tinel  Pulses: 2+ and symmetric all extremities  Skin: Skin color, turgor normal, no rashes or lesions. Skin is dry. Scab at right ear, just outside of canal (recent BCC treatment)  Lymph nodes: Cervical, supraclavicular, and axillary nodes normal  Neurologic: CNII-XII intact, normal strength, sensation and gait; reflexes   2+ and symmetric throughout     Psych: Normal mood, affect, hygiene and grooming   ASSESSMENT/PLAN:  Medicare annual wellness visit, subsequent  Type 2 diabetes mellitus with diabetic neuropathy, without long-term current use of insulin ([facility]CC) - controlled on metformin - Plan: TSH  Special screening for malignant neoplasm of prostate - Plan: PSA  Medication monitoring encounter - Plan: CBC with Differential/Platelet  Need for influenza vaccination - Plan: Flu vaccine HIGH DOSE PF (Fluzone High dose)  Diabetic peripheral neuropathy ([facility]CC) - advised of neuro's recommended dose of 100/100/300, can increase HS dose if needed  Essential hypertension, benign - elevated today, normal at recent cardiology visit and at home. Cont current regimen  Pure hypercholesterolemia - lipids at goal on statin  Nonischemic cardiomyopathy (HCC) - stable  Pulmonary nodules - had recent CT; asked to get us results when available   PSA, CBC, TSH  Discussed PSA screening (risks/benefits),   recommended at least 30 minutes of aerobic activity at least 5 days/week,  weight-bearing exercise 2x/week; proper sunscreen use reviewed; healthy diet and alcohol recommendations (less than or equal to 2 drinks/day) reviewed; regular seatbelt use; changing batteries in smoke detectors. Immunization recommendations discussed--flu shot given today (encouraged to get earlier next season). Shingrix recommended, risks/side effects reviewed. Allergic to Tetanus. Colonoscopy recommendations reviewed--get results from [facility] from last year.  Medicare Attestation I have personally reviewed: The patient's medical and social history Their use of alcohol, tobacco or illicit drugs Their current medications and supplements The patient's functional ability including ADLs,fall risks, home safety risks, cognitive, and hearing and visual impairment Diet and physical activities Evidence for depression or mood disorders  The patient's weight, height and BMI have been recorded in the chart.  I have made referrals, counseling, and provided education to the patient based on review of the above and I have provided the patient with a written personalized care plan for preventive services.        

## 2017-09-14 ENCOUNTER — Encounter: Payer: Self-pay | Admitting: Family Medicine

## 2017-09-14 ENCOUNTER — Ambulatory Visit (INDEPENDENT_AMBULATORY_CARE_PROVIDER_SITE_OTHER): Payer: Medicare HMO | Admitting: Family Medicine

## 2017-09-14 VITALS — BP 136/86 | HR 60 | Ht 72.75 in | Wt 216.2 lb

## 2017-09-14 DIAGNOSIS — R918 Other nonspecific abnormal finding of lung field: Secondary | ICD-10-CM | POA: Diagnosis not present

## 2017-09-14 DIAGNOSIS — E1142 Type 2 diabetes mellitus with diabetic polyneuropathy: Secondary | ICD-10-CM | POA: Diagnosis not present

## 2017-09-14 DIAGNOSIS — Z5181 Encounter for therapeutic drug level monitoring: Secondary | ICD-10-CM

## 2017-09-14 DIAGNOSIS — I428 Other cardiomyopathies: Secondary | ICD-10-CM

## 2017-09-14 DIAGNOSIS — I10 Essential (primary) hypertension: Secondary | ICD-10-CM | POA: Diagnosis not present

## 2017-09-14 DIAGNOSIS — E7800 Pure hypercholesterolemia, unspecified: Secondary | ICD-10-CM

## 2017-09-14 DIAGNOSIS — Z23 Encounter for immunization: Secondary | ICD-10-CM | POA: Diagnosis not present

## 2017-09-14 DIAGNOSIS — E1140 Type 2 diabetes mellitus with diabetic neuropathy, unspecified: Secondary | ICD-10-CM

## 2017-09-14 DIAGNOSIS — Z0000 Encounter for general adult medical examination without abnormal findings: Secondary | ICD-10-CM

## 2017-09-14 DIAGNOSIS — Z125 Encounter for screening for malignant neoplasm of prostate: Secondary | ICD-10-CM | POA: Diagnosis not present

## 2017-09-14 NOTE — Patient Instructions (Addendum)
HEALTH MAINTENANCE RECOMMENDATIONS:  It is recommended that you get at least 30 minutes of aerobic exercise at least 5 days/week (for weight loss, you may need as much as 60-90 minutes). This can be any activity that gets your heart rate up. This can be divided in 10-15 minute intervals if needed, but try and build up your endurance at least once a week.  Weight bearing exercise is also recommended twice weekly.  Eat a healthy diet with lots of vegetables, fruits and fiber.  "Colorful" foods have a lot of vitamins (ie green vegetables, tomatoes, red peppers, etc).  Limit sweet tea, regular sodas and alcoholic beverages, all of which has a lot of calories and sugar.  Up to 2 alcoholic drinks daily may be beneficial for men (unless trying to lose weight, watch sugars).  Drink a lot of water.  Sunscreen of at least SPF 30 should be used on all sun-exposed parts of the skin when outside between the hours of 10 am and 4 pm (not just when at beach or pool, but even with exercise, golf, tennis, and yard work!)  Use a sunscreen that says "broad spectrum" so it covers both UVA and UVB rays, and make sure to reapply every 1-2 hours.  Remember to change the batteries in your smoke detectors when changing your clock times in the spring and fall. I recommend a carbon monoxide detector for your home.  Use your seat belt every time you are in a car, and please drive safely and not be distracted with cell phones and texting while driving.   Alexander Curtis , Thank you for taking time to come for your Medicare Wellness Visit. I appreciate your ongoing commitment to your health goals. Please review the following plan we discussed and let me know if I can assist you in the future.   These are the goals we discussed: Goals    None      This is a list of the screening recommended for you and due dates:  Health Maintenance  Topic Date Due  . Eye exam for diabetics  07/02/1956  . Colon Cancer Screening  05/28/2016  .  Hemoglobin A1C  12/31/2016  . Flu Shot  02/10/2017  . Complete foot exam   08/17/2017  .  Hepatitis C: One time screening is recommended by Center for Disease Control  (CDC) for  adults born from [birth year] through 1966.   Completed  . Pneumonia vaccines  Completed   I recommend getting the new shingles vaccine (Shingrix). You will need to check with your insurance to see if it is covered, and if covered by Medicare Part D, you need to get from the pharmacy rather than our office.  It is a series of 2 injections, spaced 2 months apart.  We will get the records from your colonoscopy done last year. Your other diabetes stuff as reported above is all up to date, done at the [place], and we will enter this into your chart. Foot exam is done today. We gave you flu shot today. Please try and get in September/October of 2019 (don't wait so long)  Please get [place] copies of your CT scans, when you get those.  I did notice a small inguinal hernia on the right.  Let us know if it causes you any discomfort or pain.    Inguinal Hernia, Adult An inguinal hernia is when fat or the intestines push through the area where the leg meets the lower belly (groin) and make  a rounded lump (bulge). This condition happens over time. There are three types of inguinal hernias. These types include:  Hernias that can be pushed back into the belly (are reducible).  Hernias that cannot be pushed back into the belly (are incarcerated).  Hernias that cannot be pushed back into the belly and lose their blood supply (get strangulated). This type needs emergency surgery.  Follow these instructions at home: Lifestyle  Drink enough fluid to keep your urine (pee) clear or pale yellow.  Eat plenty of fruits, vegetables, and whole grains. These have a lot of fiber. Talk with your doctor if you have questions.  Avoid lifting heavy objects.  Avoid standing for long periods of time.  Do not use tobacco products. These include  cigarettes, chewing tobacco, or e-cigarettes. If you need help quitting, ask your doctor.  Try to stay at a healthy weight. General instructions  Do not try to force the hernia back in.  Watch your hernia for any changes in color or size. Let your doctor know if there are any changes.  Take over-the-counter and prescription medicines only as told by your doctor.  Keep all follow-up visits as told by your doctor. This is important. Contact a doctor if:  You have a fever.  You have new symptoms.  Your symptoms get worse. Get help right away if:  The area where the legs meets the lower belly has: ? Pain that gets worse suddenly. ? A bulge that gets bigger suddenly and does not go down. ? A bulge that turns red or purple. ? A bulge that is painful to the touch.  You are a man and your scrotum: ? Suddenly feels painful. ? Suddenly changes in size.  You feel sick to your stomach (nauseous) and this feeling does not go away.  You throw up (vomit) and this keeps happening.  You feel your heart beating a lot more quickly than normal.  You cannot poop (have a bowel movement) or pass gas. This information is not intended to replace advice given to you by your health care provider. Make sure you discuss any questions you have with your health care provider. Document Released: 07/30/2006 Document Revised: 12/05/2015 Document Reviewed: 05/09/2014 Elsevier Interactive Patient Education  2018 [employer].

## 2017-09-15 ENCOUNTER — Encounter: Payer: Self-pay | Admitting: Family Medicine

## 2017-09-15 LAB — CBC WITH DIFFERENTIAL/PLATELET
Basophils Absolute: 0 10*3/uL (ref 0.0–0.2)
Basos: 1 %
EOS (ABSOLUTE): 0.1 10*3/uL (ref 0.0–0.4)
Eos: 2 %
Hematocrit: 43.2 % (ref 37.5–51.0)
Hemoglobin: 14.4 g/dL (ref 13.0–17.7)
Immature Grans (Abs): 0 10*3/uL (ref 0.0–0.1)
Immature Granulocytes: 0 %
Lymphocytes Absolute: 1 10*3/uL (ref 0.7–3.1)
Lymphs: 24 %
MCH: 31 pg (ref 26.6–33.0)
MCHC: 33.3 g/dL (ref 31.5–35.7)
MCV: 93 fL (ref 79–97)
Monocytes Absolute: 0.5 10*3/uL (ref 0.1–0.9)
Monocytes: 13 %
Neutrophils Absolute: 2.4 10*3/uL (ref 1.4–7.0)
Neutrophils: 60 %
Platelets: 133 10*3/uL — ABNORMAL LOW (ref 150–379)
RBC: 4.65 x10E6/uL (ref 4.14–5.80)
RDW: 13.9 % (ref 12.3–15.4)
WBC: 4.1 10*3/uL (ref 3.4–10.8)

## 2017-09-15 LAB — PSA: Prostate Specific Ag, Serum: 0.4 ng/mL (ref 0.0–4.0)

## 2017-09-15 LAB — TSH: TSH: 1.8 u[IU]/mL (ref 0.450–4.500)

## 2017-09-16 IMAGING — CR DG CHEST 2V
2 series · 2 of 2 positions shown · non-contrast
Comparison: 03/11/2015 and prior radiographs

CLINICAL DATA: Biventricular cardiac pacemaker malfunction.

EXAM:
CHEST  2 VIEW

[w chest pa]
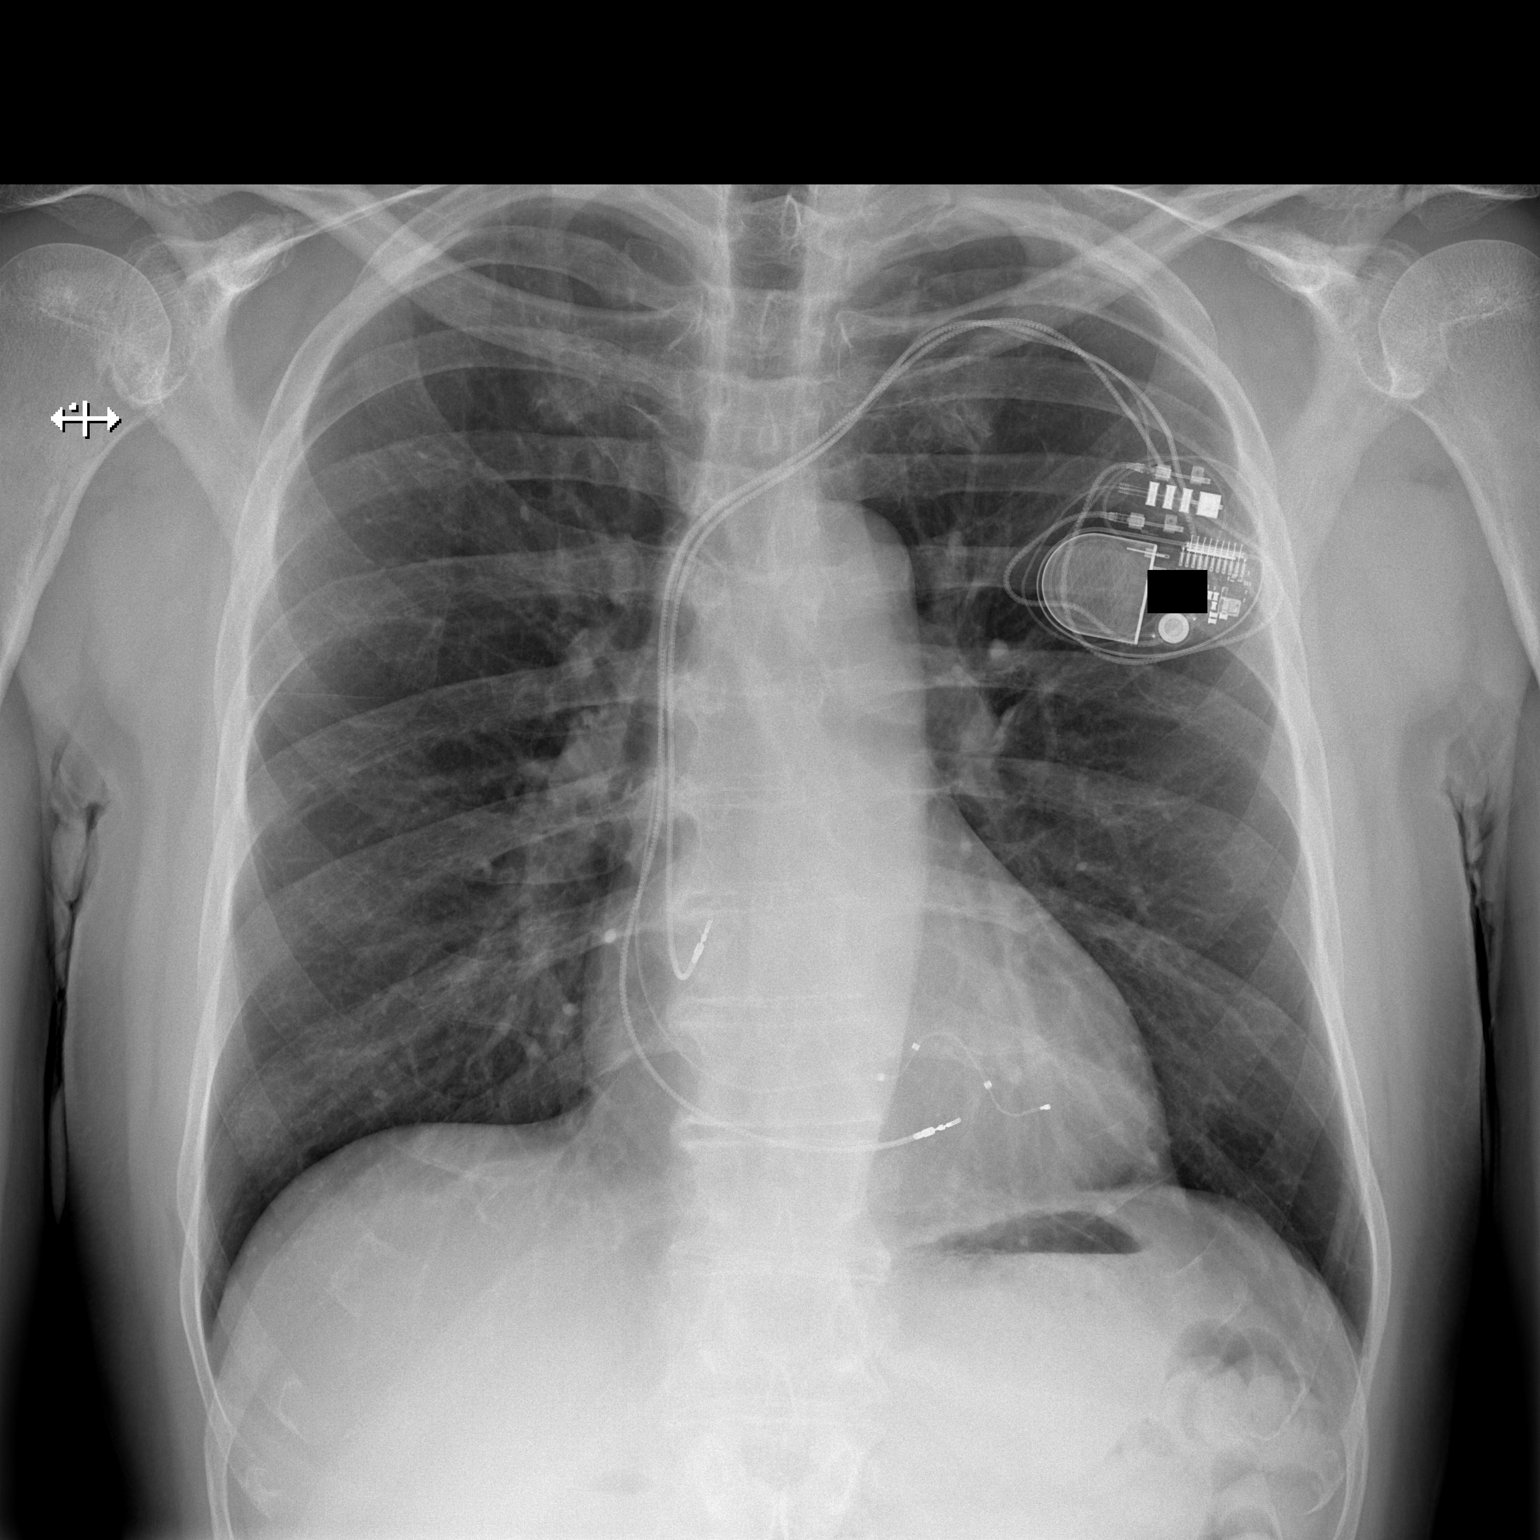

[w chest lat]
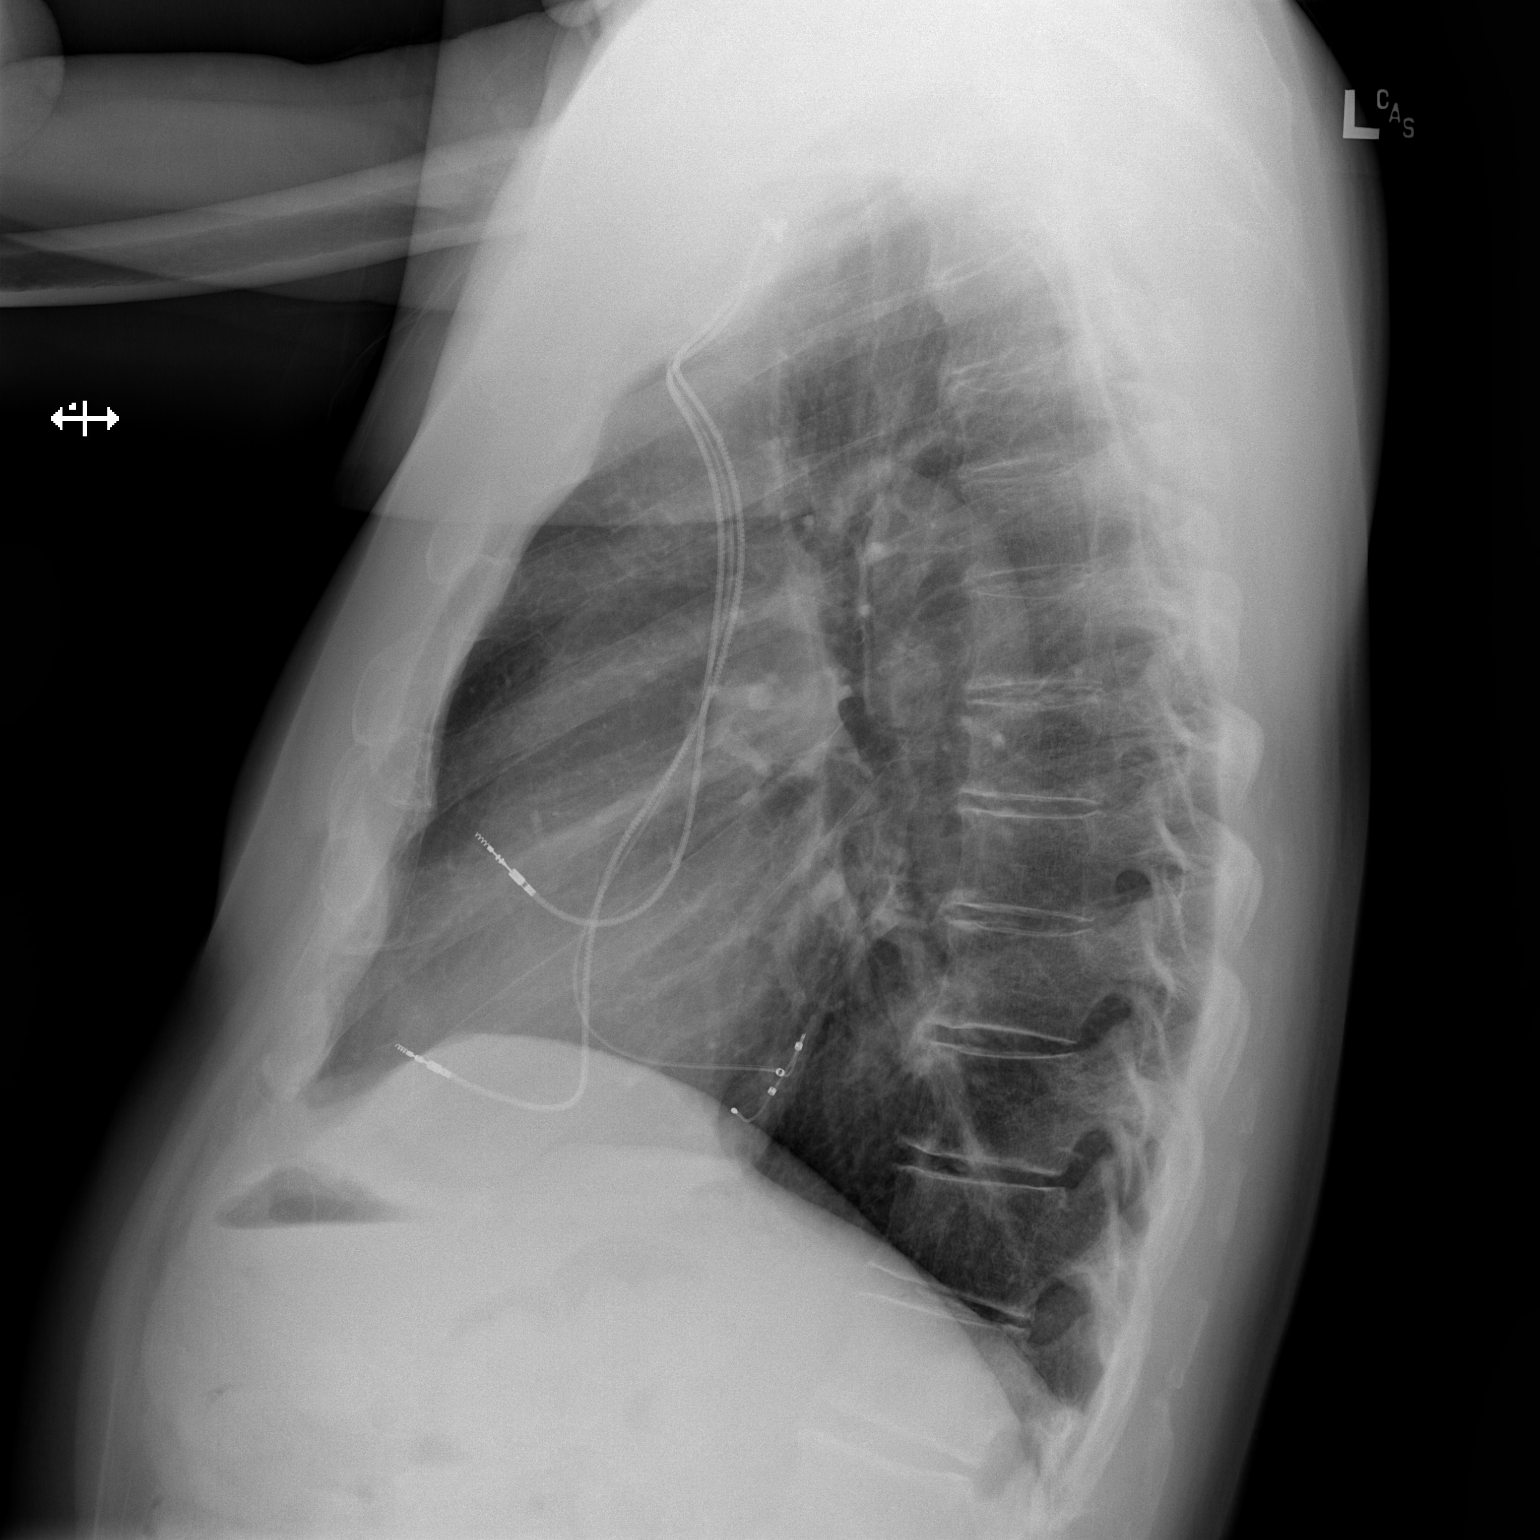

[2 of 2 positions shown; findings below may reference images not displayed]

FINDINGS: Upper limits normal heart size again noted.

A left-sided pacemaker is unchanged with leads overlying the right
atrium, right ventricle and left ventricle.

There is no evidence of focal airspace disease, pulmonary edema,
suspicious pulmonary nodule/mass, pleural effusion, or pneumothorax.

No acute bony abnormalities are identified.
IMPRESSION: Unchanged pacemaker as described.

No evidence of active cardiopulmonary disease.

## 2017-09-17 ENCOUNTER — Encounter: Payer: Self-pay | Admitting: Family Medicine

## 2017-09-24 ENCOUNTER — Telehealth: Payer: Self-pay | Admitting: Family Medicine

## 2017-09-24 NOTE — Telephone Encounter (Signed)
Colonoscopy records received from [facility]. Sending back for review.

## 2017-09-30 ENCOUNTER — Encounter: Payer: Self-pay | Admitting: *Deleted

## 2017-09-30 ENCOUNTER — Telehealth: Payer: Self-pay | Admitting: Family Medicine

## 2017-09-30 NOTE — Telephone Encounter (Signed)
Pt came in and dropped off labs. Sending back for review.  °

## 2017-10-11 ENCOUNTER — Ambulatory Visit (INDEPENDENT_AMBULATORY_CARE_PROVIDER_SITE_OTHER): Payer: Medicare HMO | Admitting: *Deleted

## 2017-10-11 DIAGNOSIS — I5022 Chronic systolic (congestive) heart failure: Secondary | ICD-10-CM

## 2017-10-11 DIAGNOSIS — I441 Atrioventricular block, second degree: Secondary | ICD-10-CM

## 2017-10-11 DIAGNOSIS — Z950 Presence of cardiac pacemaker: Secondary | ICD-10-CM | POA: Diagnosis not present

## 2017-10-11 NOTE — Progress Notes (Signed)
Remote pacemaker transmission.   

## 2017-10-11 NOTE — Progress Notes (Signed)
EPIC Encounter for ICM Monitoring  Patient Name: Alexander Curtis is a 72 y.o. male Date: 10/11/2017 Primary Care Physican: Rita Ohara, MD Primary Cardiologist:Ross Electrophysiologist: Allred Dry Weight:214lbs (office visit 2/28) Bi-V Pacing: 98%      Heart Failure questions reviewed, pt has a cold but no fluid symptoms.   Thoracic impedance normal.  No diuretic  Recommendations: No changes.  Encouraged to call for fluid symptoms.  Follow-up plan: ICM clinic phone appointment on 11/11/2017.    Copy of ICM check sent to Dr. Rayann Heman.   3 month ICM trend: 10/11/2017    1 Year ICM trend:       [name], RN 10/11/2017 10:58 AM

## 2017-10-12 ENCOUNTER — Encounter: Payer: Self-pay | Admitting: Cardiology

## 2017-10-13 ENCOUNTER — Encounter: Payer: Self-pay | Admitting: Family Medicine

## 2017-10-13 DIAGNOSIS — D3131 Benign neoplasm of right choroid: Secondary | ICD-10-CM | POA: Diagnosis not present

## 2017-10-13 DIAGNOSIS — E119 Type 2 diabetes mellitus without complications: Secondary | ICD-10-CM | POA: Diagnosis not present

## 2017-10-13 DIAGNOSIS — H2513 Age-related nuclear cataract, bilateral: Secondary | ICD-10-CM | POA: Diagnosis not present

## 2017-10-13 LAB — HM DIABETES EYE EXAM

## 2017-10-26 LAB — CUP PACEART REMOTE DEVICE CHECK
Battery Remaining Longevity: 82 mo
Battery Remaining Percentage: 95.5 %
Battery Voltage: 2.96 V
Brady Statistic AP VP Percent: 21 %
Brady Statistic AP VS Percent: 1 %
Brady Statistic AS VP Percent: 77 %
Brady Statistic AS VS Percent: 1.4 %
Brady Statistic RA Percent Paced: 21 %
Date Time Interrogation Session: 20190401063014
Implantable Lead Implant Date: 20151220
Implantable Lead Implant Date: 20151220
Implantable Lead Implant Date: 20160819
Implantable Lead Location: 753858
Implantable Lead Location: 753859
Implantable Lead Location: 753860
Implantable Lead Model: 5076
Implantable Lead Model: 5076
Implantable Pulse Generator Implant Date: 20160819
Lead Channel Impedance Value: 390 Ohm
Lead Channel Impedance Value: 390 Ohm
Lead Channel Impedance Value: 390 Ohm
Lead Channel Pacing Threshold Amplitude: 0.5 V
Lead Channel Pacing Threshold Amplitude: 0.625 V
Lead Channel Pacing Threshold Amplitude: 0.875 V
Lead Channel Pacing Threshold Pulse Width: 0.5 ms
Lead Channel Pacing Threshold Pulse Width: 0.5 ms
Lead Channel Pacing Threshold Pulse Width: 0.8 ms
Lead Channel Sensing Intrinsic Amplitude: 2.2 mV
Lead Channel Sensing Intrinsic Amplitude: 3 mV
Lead Channel Setting Pacing Amplitude: 1.5 V
Lead Channel Setting Pacing Amplitude: 2 V
Lead Channel Setting Pacing Amplitude: 2 V
Lead Channel Setting Pacing Pulse Width: 0.5 ms
Lead Channel Setting Pacing Pulse Width: 0.8 ms
Lead Channel Setting Sensing Sensitivity: 1.5 mV
Pulse Gen Model: 3262
Pulse Gen Serial Number: 3099689

## 2017-10-28 ENCOUNTER — Encounter: Payer: Self-pay | Admitting: Family Medicine

## 2017-11-11 ENCOUNTER — Ambulatory Visit (INDEPENDENT_AMBULATORY_CARE_PROVIDER_SITE_OTHER): Payer: Medicare HMO

## 2017-11-11 DIAGNOSIS — I5022 Chronic systolic (congestive) heart failure: Secondary | ICD-10-CM | POA: Diagnosis not present

## 2017-11-11 DIAGNOSIS — Z950 Presence of cardiac pacemaker: Secondary | ICD-10-CM | POA: Diagnosis not present

## 2017-11-11 NOTE — Progress Notes (Signed)
EPIC Encounter for ICM Monitoring  Patient Name: Alexander Curtis is a 72 y.o. male Date: 11/11/2017 Primary Care Physican: Rita Ohara, MD Primary Cardiologist:Ross Electrophysiologist: Allred Dry Weight:206lbs  Bi-V Pacing: 98%       Spoke with wife and patient.  Heart Failure questions reviewed, pt asymptomatic. He had a cold during decreased impedance.  The are traveling to [facility] this next week and said there is a warning about the strong electromagnetic field at the [facility]. She asked if I knew anything about it.  I provided [employer] number and advised to call for information.  Advised if there is any question about it then he should not go.  They leave for [place] on 6/4.   Thoracic impedance normal but was abnormal from 10/25/2017 to 11/02/2017.  No diuretic  Recommendations:  No changes.   Encouraged to call for fluid symptoms.  Follow-up plan: ICM clinic phone appointment on 12/13/2017.    Copy of ICM check sent to Dr. Rayann Heman.   3 month ICM trend: 11/11/2017    1 Year ICM trend:       [name], RN 11/11/2017 3:17 PM

## 2017-12-13 ENCOUNTER — Ambulatory Visit (INDEPENDENT_AMBULATORY_CARE_PROVIDER_SITE_OTHER): Payer: Medicare HMO

## 2017-12-13 ENCOUNTER — Telehealth: Payer: Self-pay

## 2017-12-13 DIAGNOSIS — Z950 Presence of cardiac pacemaker: Secondary | ICD-10-CM | POA: Diagnosis not present

## 2017-12-13 DIAGNOSIS — I5022 Chronic systolic (congestive) heart failure: Secondary | ICD-10-CM | POA: Diagnosis not present

## 2017-12-13 NOTE — Telephone Encounter (Signed)
Spoke with wife and requested patient send a remote transmission today. She verbalized understanding.

## 2017-12-13 NOTE — Progress Notes (Signed)
EPIC Encounter for ICM Monitoring  Patient Name: Alexander Curtis is a 72 y.o. male Date: 12/13/2017 Primary Care Physican: Rita Ohara, MD Primary Cardiologist:Ross Electrophysiologist: Allred Dry Weight:206lbs Bi-V Pacing: 98%       Heart Failure questions reviewed, pt asymptomatic now but did have a cold with cough during decreased impedance.  He is flying to [place] on 6/6 but his wife is starting to get his cold.    Thoracic impedance normal but was abnormal suggesting fluid accumulation from 11/28/2017 - 12/02/2017.  Patient had trouble with monitor and direct trend viewer was used to check fluid levels.  No diuretic  Recommendations: No changes.   Encouraged to call for fluid symptoms.  Follow-up plan: ICM clinic phone appointment on 01/17/2018.    Copy of ICM check sent to Dr. Rayann Heman.   Direct Trend Viewer through 12/12/2017      [name], RN 12/13/2017 11:26 AM

## 2017-12-15 ENCOUNTER — Telehealth: Payer: Self-pay | Admitting: Family Medicine

## 2017-12-15 NOTE — Telephone Encounter (Signed)
Pt dropped of form to be filled out informed pt that you was out off the office until Monday put in your folder pt can be reached at [phone]

## 2017-12-22 NOTE — Telephone Encounter (Signed)
These actually look like release of records forms.  Sending back to the front in red folder

## 2017-12-22 NOTE — Telephone Encounter (Signed)
Got form and gave to [facility]

## 2018-01-17 ENCOUNTER — Ambulatory Visit (INDEPENDENT_AMBULATORY_CARE_PROVIDER_SITE_OTHER): Payer: Medicare HMO

## 2018-01-17 ENCOUNTER — Ambulatory Visit (INDEPENDENT_AMBULATORY_CARE_PROVIDER_SITE_OTHER): Payer: Medicare HMO | Admitting: *Deleted

## 2018-01-17 DIAGNOSIS — Z950 Presence of cardiac pacemaker: Secondary | ICD-10-CM

## 2018-01-17 DIAGNOSIS — I441 Atrioventricular block, second degree: Secondary | ICD-10-CM

## 2018-01-17 DIAGNOSIS — I5022 Chronic systolic (congestive) heart failure: Secondary | ICD-10-CM

## 2018-01-17 NOTE — Progress Notes (Signed)
EPIC Encounter for ICM Monitoring  Patient Name: Alexander Curtis is a 72 y.o. male Date: 01/17/2018 Primary Care Physican: Rita Ohara, MD Primary Cardiologist:Ross Electrophysiologist: Allred Dry Weight:211lbs Bi-V Pacing: 98%       Heart Failure questions reviewed, pt asymptomatic now but did have fluid symptoms of weight gain into 217 lbs during on the 19 days vacation that included a cruise.  He asked if heart patients should travel to places that have a high altitude like [place] and the city of entry which is higher than the mountain.  Advised will ask the physician about altitude issues and heart failure.     Thoracic impedance normal.  No diuretic  Recommendations: No changes.  Reinforced fluid restriction to < 2 L daily and sodium restriction to less than 2000 mg daily.  Encouraged to call for fluid symptoms.  Follow-up plan: ICM clinic phone appointment on 02/21/2018.   Copy of ICM check sent to Dr. Rayann Heman.   3 month ICM trend: 01/17/2018    1 Year ICM trend:       [name], RN 01/17/2018 4:38 PM

## 2018-01-18 ENCOUNTER — Telehealth: Payer: Self-pay

## 2018-01-18 NOTE — Progress Notes (Signed)
Remote pacemaker transmission.   

## 2018-01-18 NOTE — Telephone Encounter (Signed)
Attempted call and no answer.

## 2018-01-18 NOTE — Telephone Encounter (Signed)
Attempted call to patient regarding question about traveling to places with high altitudes.  Left message to return call.

## 2018-01-19 ENCOUNTER — Telehealth: Payer: Self-pay | Admitting: Family Medicine

## 2018-01-19 NOTE — Telephone Encounter (Signed)
I believe that this message was from this morning, and we have already spoken to wife/pt

## 2018-01-19 NOTE — Telephone Encounter (Signed)
Pt left a message that he has poison ivy and it's getting worse and Benadryl is not working, topical not helping, going on for week, wants something stronger/prescription called in to relieve itch an, keeping him up at night

## 2018-01-20 ENCOUNTER — Ambulatory Visit (INDEPENDENT_AMBULATORY_CARE_PROVIDER_SITE_OTHER): Payer: Medicare HMO | Admitting: Family Medicine

## 2018-01-20 ENCOUNTER — Encounter: Payer: Self-pay | Admitting: Family Medicine

## 2018-01-20 VITALS — BP 110/60 | HR 64 | Temp 96.9°F | Ht 72.5 in | Wt 215.4 lb

## 2018-01-20 DIAGNOSIS — L237 Allergic contact dermatitis due to plants, except food: Secondary | ICD-10-CM | POA: Diagnosis not present

## 2018-01-20 DIAGNOSIS — I10 Essential (primary) hypertension: Secondary | ICD-10-CM

## 2018-01-20 DIAGNOSIS — E1140 Type 2 diabetes mellitus with diabetic neuropathy, unspecified: Secondary | ICD-10-CM

## 2018-01-20 DIAGNOSIS — L03113 Cellulitis of right upper limb: Secondary | ICD-10-CM

## 2018-01-20 MED ORDER — CEPHALEXIN 500 MG PO CAPS: 500.0000 mg | ORAL_CAPSULE | Freq: Three times a day (TID) | ORAL | 0 refills | Status: DC

## 2018-01-20 MED ORDER — PREDNISONE 10 MG PO TABS: ORAL_TABLET | ORAL | 0 refills | Status: DC

## 2018-01-20 NOTE — Telephone Encounter (Signed)
Spoke with patient.  Explained Dr Caryl Comes said that he could travel to [facility] but there is a higher risk for complications regarding the heart, it makes the heart work harder to be at that high altitude, and he would need to stay at a lower altitude such as around 5000 feet for 3-4 days to get acclimated before going to 8,000-11,000 altitude.  He said he did not want to take that kind of risk and would not be traveling there.  He thanked me for the information.

## 2018-01-20 NOTE — Patient Instructions (Signed)
Take steroids as prescribed.  Expect your sugars to go high for when you are on the higher doses--unless you feel really awful, don't worry about it and it will come down.  Take 6 pills today (Thurs), 5 pills on Friday, 4 pills on Saturday, 3 pills on Sunday and Monday, 2 pills on Tues, Wed, Thurs, and 1 pill on Fri, Sat, Sun. Take pills in the morning with food. Do not take anti-inflammatories while taking this (no advil/motrin/aleve/naproxen/ibuprofen). You can use tylenol for pain, fever.  You have evidence of a cellulitis--skin infection--in the right arm.  We need to treat this with antibiotics. Take it three times daily (Keflex) for 10 days. I'm marking the redness with a pen. You should see the redness decrease in size (not spread beyond the pen mark) after 24-48 hours. If it is spreading, getting more swollen, red, tender, fever, then the antibiotic isn't working and needs further evaluation.   Poison Ivy Dermatitis Poison ivy dermatitis is inflammation of the skin that is caused by the allergens on the leaves of the poison ivy plant. The skin reaction often involves redness, swelling, blisters, and extreme itching. What are the causes? This condition is caused by a specific chemical (urushiol) found in the sap of the poison ivy plant. This chemical is sticky and can be easily spread to people, animals, and objects. You can get poison ivy dermatitis by:  Having direct contact with a poison ivy plant.  Touching animals, other people, or objects that have come in contact with poison ivy and have the chemical on them.  What increases the risk? This condition is more likely to develop in:  People who are outdoors often.  People who go outdoors without wearing protective clothing, such as closed shoes, long pants, and a long-sleeved shirt.  What are the signs or symptoms? Symptoms of this condition include:  Redness and itching.  A rash that often includes bumps and blisters.  The rash usually appears 48 hours after exposure.  Swelling. This may occur if the reaction is more severe.  Symptoms usually last for 1-2 weeks. However, the first time you develop this condition, symptoms may last 3-4 weeks. How is this diagnosed? This condition may be diagnosed based on your symptoms and a physical exam. Your health care provider may also ask you about any recent outdoor activity. How is this treated? Treatment for this condition will vary depending on how severe it is. Treatment may include:  Hydrocortisone creams or calamine lotions to relieve itching.  Oatmeal baths to soothe the skin.  Over-the-counter antihistamine tablets.  Oral steroid medicine for more severe outbreaks.  Follow these instructions at home:  Take or apply over-the-counter and prescription medicines only as told by your health care provider.  Wash exposed skin as soon as possible with soap and cold water.  Use hydrocortisone creams or calamine lotion as needed to soothe the skin and relieve itching.  Take oatmeal baths as needed. Use colloidal oatmeal. You can get this at your local pharmacy or grocery store. Follow the instructions on the packaging.  Do not scratch or rub your skin.  While you have the rash, wash clothes right after you wear them. How is this prevented?  Learn to identify the poison ivy plant and avoid contact with the plant. This plant can be recognized by the number of leaves. Generally, poison ivy has three leaves with flowering branches on a single stem. The leaves are typically glossy, and they have jagged edges  that come to a point at the front.  If you have been exposed to poison ivy, thoroughly wash with soap and water right away. You have about 30 minutes to remove the plant resin before it will cause the rash. Be sure to wash under your fingernails because any plant resin there will continue to spread the rash.  When hiking or camping, wear clothes that will  help you to avoid exposure on the skin. This includes long pants, a long-sleeved shirt, tall socks, and hiking boots. You can also apply preventive lotion to your skin to help limit exposure.  If you suspect that your clothes or outdoor gear came in contact with poison ivy, rinse them off outside with a garden hose before you bring them inside your house. Contact a health care provider if:  You have open sores in the rash area.  You have more redness, swelling, or pain in the affected area.  You have redness that spreads beyond the rash area.  You have fluid, blood, or pus coming from the affected area.  You have a fever.  You have a rash over a large area of your body.  You have a rash on your eyes, mouth, or genitals.  Your rash does not improve after a few days. Get help right away if:  Your face swells or your eyes swell shut.  You have trouble breathing.  You have trouble swallowing. This information is not intended to replace advice given to you by your health care provider. Make sure you discuss any questions you have with your health care provider. Document Released: 06/26/2000 Document Revised: 12/05/2015 Document Reviewed: 12/05/2014 Elsevier Interactive Patient Education  2018 [employer].   Cellulitis, Adult Cellulitis is a skin infection. The infected area is usually red and tender. This condition occurs most often in the arms and lower legs. The infection can travel to the muscles, blood, and underlying tissue and become serious. It is very important to get treated for this condition. What are the causes? Cellulitis is caused by bacteria. The bacteria enter through a break in the skin, such as a cut, burn, insect bite, open sore, or crack. What increases the risk? This condition is more likely to occur in people who:  Have a weak defense system (immune system).  Have open wounds on the skin such as cuts, burns, bites, and scrapes. Bacteria can enter the body  through these open wounds.  Are older.  Have diabetes.  Have a type of long-lasting (chronic) liver disease (cirrhosis) or kidney disease.  Use IV drugs.  What are the signs or symptoms? Symptoms of this condition include:  Redness, streaking, or spotting on the skin.  Swollen area of the skin.  Tenderness or pain when an area of the skin is touched.  Warm skin.  Fever.  Chills.  Blisters.  How is this diagnosed? This condition is diagnosed based on a medical history and physical exam. You may also have tests, including:  Blood tests.  Lab tests.  Imaging tests.  How is this treated? Treatment for this condition may include:  Medicines, such as antibiotic medicines or antihistamines.  Supportive care, such as rest and application of cold or warm cloths (cold or warm compresses) to the skin.  Hospital care, if the condition is severe.  The infection usually gets better within 1-2 days of treatment. Follow these instructions at home:  Take over-the-counter and prescription medicines only as told by your health care provider.  If you were  prescribed an antibiotic medicine, take it as told by your health care provider. Do not stop taking the antibiotic even if you start to feel better.  Drink enough fluid to keep your urine clear or pale yellow.  Do not touch or rub the infected area.  Raise (elevate) the infected area above the level of your heart while you are sitting or lying down.  Apply warm or cold compresses to the affected area as told by your health care provider.  Keep all follow-up visits as told by your health care provider. This is important. These visits let your health care provider make sure a more serious infection is not developing. Contact a health care provider if:  You have a fever.  Your symptoms do not improve within 1-2 days of starting treatment.  Your bone or joint underneath the infected area becomes painful after the skin has  healed.  Your infection returns in the same area or another area.  You notice a swollen bump in the infected area.  You develop new symptoms.  You have a general ill feeling (malaise) with muscle aches and pains. Get help right away if:  Your symptoms get worse.  You feel very sleepy.  You develop vomiting or diarrhea that persists.  You notice red streaks coming from the infected area.  Your red area gets larger or turns dark in color. This information is not intended to replace advice given to you by your health care provider. Make sure you discuss any questions you have with your health care provider. Document Released: 04/08/2005 Document Revised: 11/07/2015 Document Reviewed: 05/08/2015 Elsevier Interactive Patient Education  [employer].

## 2018-01-20 NOTE — Progress Notes (Signed)
Chief Complaint  Patient presents with  . Poison Ivy    over a week, benadryl not working.    He was cutting down kuzo vine out of tree last week, and about 2-3 days later he noticed rash on his left anterior forearm, spread to his right arm, feet.  The right arm has been getting progressively worse--red, painful. The other areas are just itchy.  No known fever, has had some chills.  He has been using calamine lotion and taking benadryl 50mg  at bedtime.    PMH, PSH, SH reviewed  Outpatient Encounter Medications as of 01/20/2018  Medication Sig Note  . aspirin 81 MG tablet Take 81 mg by mouth every other day.   Marland Kitchen atorvastatin (LIPITOR) 40 MG tablet Take 40 mg by mouth daily.   Marland Kitchen b complex vitamins capsule Take 1 capsule by mouth daily.   . carvedilol (COREG) 6.25 MG tablet TAKE 1 TABLET(6.25 MG) BY MOUTH TWICE DAILY WITH A MEAL   . CINNAMON PO Take 2,000 mg by mouth 3 (three) times daily.    . Coenzyme Q10 (COQ10) 200 MG CAPS Take 1 capsule by mouth 2 (two) times daily.    . diphenhydrAMINE (BENADRYL) 50 MG tablet Take 50 mg by mouth at bedtime as needed for itching.   . fish oil-omega-3 fatty acids 1000 MG capsule Take 1 g by mouth 3 (three) times daily.    Marland Kitchen GABAPENTIN PO Take 1 tablet by mouth 3 (three) times daily. Pt unsure of strenght 09/14/2017: 100mg  in morning, mid-day and taking 200mg  qHS (original rx from [place] was to take 300mg  qHS)  . lisinopril (PRINIVIL,ZESTRIL) 20 MG tablet Take 20 mg by mouth daily.   . Magnesium 500 MG TABS Take 500 mg by mouth daily.    . metFORMIN (GLUCOPHAGE) 500 MG tablet Take 500 mg by mouth daily as needed (dietary indiscretion).  09/14/2017: Takes one pill daily, gets from [place], doesn't think it is extended release  . Multiple Vitamins-Minerals (MENS 50+ MULTI VITAMIN/MIN PO) Take 1 tablet by mouth daily.   . TURMERIC PO Take 1 tablet by mouth daily.   . vitamin C (ASCORBIC ACID) 500 MG tablet Take 500 mg by mouth daily.    Facility-Administered Encounter  Medications as of 01/20/2018  Medication  . influenza  inactive virus vaccine (FLUZONE/FLUARIX) injection 0.5 mL   Allergies  Allergen Reactions  . Horse-Derived Products Anaphylaxis    REACTION: Anaphlactic Reaction.  Can't take tetanus shot  . Pravastatin Other (See Comments)    Joint pain   . Adhesive [Tape] Itching and Rash   ROS: no fever, some chills. No URI symptoms, headache, dizziness, chest pain, nausea, vomiting, diarrhea or other complaints, except rash and pain as noted in HPI   PHYSICAL EXAM:  BP 110/60   Pulse 64   Temp (!) 96.9 F (36.1 C)   Ht 6' 0.5" (1.842 m)   Wt 215 lb 6.4 oz (97.7 kg)   BMI 28.81 kg/m   Well appearing male, in mild discomfort relating to right upper arm Skin: Left anterior mid-forearm has a crusted erythematous papule with some surrounding erythema, measuring 2cm, with central scab  Right upper arm has lsignificant erythema with soft tissue swelling, with clear demarcated borders superiorly and inferiorly, marked with pen.  Within this area there are multiple vesicular and scabbed lesions, and a very long linear lesion extending from the upper arm to the forearm Entire posterior upper right arm is red, swollen, and many excoriated vesicles, scabbed.  Many papules and vesicles scattered along the anterior portions of knees, shins, feet, a couple on lower abdomen laterally on the left.  ASSESSMENT/PLAN:  Plant allergic contact dermatitis - Plan: predniSONE (DELTASONE) 10 MG tablet  Cellulitis of right upper arm - Plan: cephALEXin (KEFLEX) 500 MG capsule  Type 2 diabetes mellitus with diabetic neuropathy, without long-term current use of insulin ([place]) - controlled; expect significant increased blood sugars with prednisone  Essential hypertension, benign - controlled   Poison ivy with secondary cellulitis RUE. Risks/side effects reviewed of steroids in detail. Cont supportive measures (topicals, benadryl prn, especially if prednisone  causes insomnia), tylenol for pain. Reviewed expected course, s/sx of worsening cellulitis and when to return. Pt is diabetic--expect sugars to increase, reassured, will be short-lived.   Take steroids as prescribed.  Expect your sugars to go high for when you are on the higher doses--unless you feel really awful, don't worry about it and it will come down.  Take 6 pills today (Thurs), 5 pills on Friday, 4 pills on Saturday, 3 pills on Sunday and Monday, 2 pills on Tues, Wed, Thurs, and 1 pill on Fri, Sat, Sun. Take pills in the morning with food. Do not take anti-inflammatories while taking this (no advil/motrin/aleve/naproxen/ibuprofen). You can use tylenol for pain, fever.  You have evidence of a cellulitis--skin infection--in the right arm.  We need to treat this with antibiotics. Take it three times daily (Keflex) for 10 days. I'm marking the redness with a pen. You should see the redness decrease in size (not spread beyond the pen mark) after 24-48 hours. If it is spreading, getting more swollen, red, tender, fever, then the antibiotic isn't working and needs further evaluation.    60/50/40/30/30/20/20/20/10/10/10

## 2018-01-25 LAB — CUP PACEART REMOTE DEVICE CHECK
Battery Remaining Longevity: 83 mo
Battery Remaining Percentage: 95.5 %
Battery Voltage: 2.96 V
Brady Statistic AP VP Percent: 21 %
Brady Statistic AP VS Percent: 1 %
Brady Statistic AS VP Percent: 77 %
Brady Statistic AS VS Percent: 1.5 %
Brady Statistic RA Percent Paced: 21 %
Date Time Interrogation Session: 20190708064439
Implantable Lead Implant Date: 20151220
Implantable Lead Implant Date: 20151220
Implantable Lead Implant Date: 20160819
Implantable Lead Location: 753858
Implantable Lead Location: 753859
Implantable Lead Location: 753860
Implantable Lead Model: 5076
Implantable Lead Model: 5076
Implantable Pulse Generator Implant Date: 20160819
Lead Channel Impedance Value: 390 Ohm
Lead Channel Impedance Value: 390 Ohm
Lead Channel Impedance Value: 430 Ohm
Lead Channel Pacing Threshold Amplitude: 0.5 V
Lead Channel Pacing Threshold Amplitude: 0.75 V
Lead Channel Pacing Threshold Amplitude: 1.125 V
Lead Channel Pacing Threshold Pulse Width: 0.5 ms
Lead Channel Pacing Threshold Pulse Width: 0.5 ms
Lead Channel Pacing Threshold Pulse Width: 0.8 ms
Lead Channel Sensing Intrinsic Amplitude: 1.8 mV
Lead Channel Sensing Intrinsic Amplitude: 3.1 mV
Lead Channel Setting Pacing Amplitude: 1.5 V
Lead Channel Setting Pacing Amplitude: 2 V
Lead Channel Setting Pacing Amplitude: 2.125
Lead Channel Setting Pacing Pulse Width: 0.5 ms
Lead Channel Setting Pacing Pulse Width: 0.8 ms
Lead Channel Setting Sensing Sensitivity: 1.5 mV
Pulse Gen Model: 3262
Pulse Gen Serial Number: 3099689

## 2018-02-02 DIAGNOSIS — E119 Type 2 diabetes mellitus without complications: Secondary | ICD-10-CM | POA: Diagnosis not present

## 2018-02-02 DIAGNOSIS — R0989 Other specified symptoms and signs involving the circulatory and respiratory systems: Secondary | ICD-10-CM | POA: Diagnosis not present

## 2018-02-09 DIAGNOSIS — R69 Illness, unspecified: Secondary | ICD-10-CM | POA: Diagnosis not present

## 2018-02-21 ENCOUNTER — Ambulatory Visit (INDEPENDENT_AMBULATORY_CARE_PROVIDER_SITE_OTHER): Payer: Medicare HMO

## 2018-02-21 DIAGNOSIS — Z950 Presence of cardiac pacemaker: Secondary | ICD-10-CM

## 2018-02-21 DIAGNOSIS — I5022 Chronic systolic (congestive) heart failure: Secondary | ICD-10-CM

## 2018-02-22 ENCOUNTER — Telehealth: Payer: Self-pay

## 2018-02-22 NOTE — Progress Notes (Addendum)
Patient returned call. He stated he has gained some weight.  [place]  Weight has returned to 206 lbs and he feels like the fluid has resolved.  Next ICM remote transmission 03/24/2018.  Encouraged to call for any fluid symptoms.

## 2018-02-22 NOTE — Telephone Encounter (Signed)
Remote ICM transmission received.  Attempted call to patient and left detailed message, per DPR, to return call regarding transmission.    

## 2018-02-22 NOTE — Progress Notes (Addendum)
EPIC Encounter for ICM Monitoring  Patient Name: Alexander Curtis is a 72 y.o. male Date: 02/22/2018 Primary Care Physican: Rita Ohara, MD Primary Cardiologist:Ross Electrophysiologist: Allred Dry Weight:Previous weight 211lbs Bi-V Pacing: 98%      Attempted call to patient and unable to reach.  Left detailed message, per DPR, regarding transmission.  Transmission reviewed.    Thoracic impedance back at baseline 8/13 but was abnormal suggesting fluid accumulation starting 02/07/2018 - 02/21/2018.  No diuretic  Recommendations: Left voice mail with ICM number and encouraged to call if experiencing any fluid symptoms.  Follow-up plan: ICM clinic phone appointment on 03/24/2018.    Copy of ICM check sent to Dr. Rayann Heman.   3 month ICM trend: 02/21/2018    1 Year ICM trend:       [name], RN 02/22/2018 10:47 AM

## 2018-03-24 ENCOUNTER — Ambulatory Visit (INDEPENDENT_AMBULATORY_CARE_PROVIDER_SITE_OTHER): Payer: Medicare HMO

## 2018-03-24 DIAGNOSIS — I5022 Chronic systolic (congestive) heart failure: Secondary | ICD-10-CM | POA: Diagnosis not present

## 2018-03-24 DIAGNOSIS — Z950 Presence of cardiac pacemaker: Secondary | ICD-10-CM

## 2018-03-25 ENCOUNTER — Telehealth: Payer: Self-pay

## 2018-03-25 NOTE — Telephone Encounter (Signed)
Remote ICM transmission received.  Attempted call to patient and left detailed message, per DPR, regarding transmission and next ICM scheduled for 04/25/2018.  Advised to return call for any fluid symptoms or questions.

## 2018-03-25 NOTE — Progress Notes (Signed)
EPIC Encounter for ICM Monitoring  Patient Name: Alexander Curtis is a 72 y.o. male Date: 03/25/2018 Primary Care Physican: Rita Ohara, MD Primary Cardiologist:Ross Electrophysiologist: Allred Dry Weight:Previous weight 206lbs Bi-V Pacing: 98%            Attempted call to patient and unable to reach.  Left detailed message, per DPR, regarding transmission.  Transmission reviewed.    Thoracic impedance normal.  No diuretic  Recommendations: Left voice mail with ICM number and encouraged to call if experiencing any fluid symptoms.  Follow-up plan: ICM clinic phone appointment on 04/25/2018.       Copy of ICM check sent to Dr. Rayann Heman.   3 month ICM trend: 03/24/2018    1 Year ICM trend:       [name], RN 03/25/2018 12:48 PM

## 2018-04-25 ENCOUNTER — Encounter: Payer: Medicare HMO | Admitting: *Deleted

## 2018-04-25 ENCOUNTER — Telehealth: Payer: Self-pay | Admitting: Cardiology

## 2018-04-25 NOTE — Telephone Encounter (Signed)
LMOVM reminding pt to send remote transmission.   

## 2018-04-26 ENCOUNTER — Telehealth: Payer: Self-pay

## 2018-04-26 NOTE — Telephone Encounter (Signed)
Error

## 2018-04-26 NOTE — Progress Notes (Signed)
No ICM remote transmission received for 04/25/2018 and next ICM transmission scheduled for 05/26/2018.

## 2018-05-06 ENCOUNTER — Encounter: Payer: Self-pay | Admitting: Nurse Practitioner

## 2018-05-26 ENCOUNTER — Telehealth: Payer: Self-pay

## 2018-05-26 ENCOUNTER — Ambulatory Visit (INDEPENDENT_AMBULATORY_CARE_PROVIDER_SITE_OTHER): Payer: Medicare HMO

## 2018-05-26 DIAGNOSIS — I5022 Chronic systolic (congestive) heart failure: Secondary | ICD-10-CM

## 2018-05-26 DIAGNOSIS — Z950 Presence of cardiac pacemaker: Secondary | ICD-10-CM | POA: Diagnosis not present

## 2018-05-26 NOTE — Progress Notes (Signed)
EPIC Encounter for ICM Monitoring  Patient Name: Alexander Curtis is a 72 y.o. male Date: 05/26/2018 Primary Care Physican: Rita Ohara, MD Primary Cardiologist:Ross Electrophysiologist: Allred Bi-V Pacing: 98% Last Weight: 206lbs Today's Weight:  unknown      Attempted call to patient and unable to reach.  Left detailed message, per DPR, regarding transmission.  Transmission reviewed.    Thoracic impedance abnormal suggesting fluid accumulation starting 05/22/2018.   Prescribed: No diuretic  Recommendations: Left voice mail with ICM number and encouraged to call if experiencing any fluid symptoms.  Advised to limit salt intake  Follow-up plan: ICM clinic phone appointment on 06/03/2018 to recheck fluid levels.   Office appointment scheduled 06/28/2018 with Tommye Standard, PA.    Copy of ICM check sent to Dr. Rayann Heman.   3 month ICM trend: 05/26/2018    1 Year ICM trend:       [name], RN 05/26/2018 12:15 PM

## 2018-05-26 NOTE — Telephone Encounter (Signed)
Remote ICM transmission received.  Attempted call to patient regarding ICM remote transmission and left detailed message, per DPR, with next ICM remote transmission date of 06/03/2018.  Advised to return call for any fluid symptoms or questions.

## 2018-05-31 NOTE — Progress Notes (Signed)
Pt feeling better  Had and upper resp infection.   Wt between 205 and 209

## 2018-06-02 ENCOUNTER — Telehealth: Payer: Self-pay

## 2018-06-02 NOTE — Telephone Encounter (Signed)
Returned call to patient as requested by voice mail message regarding fluid accumulation on last remote report.  Spoke with wife due to patient not home. She said Dr Harrington Challenger called patient at home after reviewing last remote transmission suggesting fluid accumulation.  Patient has been sick with cold and advised that may be the reason the report was showing possible fluid accumulation.  She said he is feeling fine.  Advised will recheck remote transmission tomorrow and give him a call.

## 2018-06-03 ENCOUNTER — Ambulatory Visit (INDEPENDENT_AMBULATORY_CARE_PROVIDER_SITE_OTHER): Payer: Medicare HMO

## 2018-06-03 DIAGNOSIS — Z950 Presence of cardiac pacemaker: Secondary | ICD-10-CM

## 2018-06-03 DIAGNOSIS — I5022 Chronic systolic (congestive) heart failure: Secondary | ICD-10-CM

## 2018-06-03 NOTE — Progress Notes (Signed)
EPIC Encounter for ICM Monitoring  Patient Name: Alexander Curtis is a 72 y.o. male Date: 06/03/2018 Primary Care Physican: Rita Ohara, MD Primary Cardiologist:Ross Electrophysiologist: Allred Bi-V Pacing: 98% Last Weight: 206lbs Today's Weight: 209 lbs        Heart Failure questions reviewed, pt's weight has increased 3 lbs in the last couple of weeks.  Reviewed current foods he has been eating in the last few weeks. He has been eating at restaurants, cheese and crackers for lunch, and he was on vacation in [place] from 11/8 to 11/11.  Researched sodium content to help him understand the foods are high in salt.  For example: Crackers=120 mg per 2 crackers, gouda cheese=270 mg per oz.      Thoracic impedance abnormal suggesting fluid accumulation starting 05/31/2018.   Prescribed: No diuretic  Recommendations:  Patient thought he was following a low salt diet.  Advised to review more food labels and cut back on salt intake.  He has a friend that sent him homemade Soppressata and advised if bought at deli, it is 450 mg salt per oz.    Follow-up plan: ICM clinic phone appointment on 06/16/2018 to recheck fluid levels.   Office appointment scheduled 06/28/2018 with  Tommye Standard, PA.    Copy of ICM check sent to Dr. Rayann Heman and Dr Harrington Challenger.   3 month ICM trend: 06/03/2018    1 Year ICM trend:       [name], RN 06/03/2018 3:26 PM

## 2018-06-16 ENCOUNTER — Ambulatory Visit (INDEPENDENT_AMBULATORY_CARE_PROVIDER_SITE_OTHER): Payer: Medicare HMO

## 2018-06-16 DIAGNOSIS — I5022 Chronic systolic (congestive) heart failure: Secondary | ICD-10-CM

## 2018-06-16 DIAGNOSIS — Z950 Presence of cardiac pacemaker: Secondary | ICD-10-CM

## 2018-06-17 NOTE — Progress Notes (Signed)
EPIC Encounter for ICM Monitoring  Patient Name: Alexander Curtis is a 72 y.o. male Date: 06/17/2018 Primary Care Physican: Rita Ohara, MD Primary Cardiologist:Ross Electrophysiologist: Allred Bi-V Pacing: 98% Last Weight: 209 lbs  Today's Weight: unknown             Attempted call to patient and unable to reach.  Left detailed message, per DPR, regarding transmission.  Transmission reviewed.    Thoracic impedance returned to normal since last ICM remote transmission 06/03/2018.   Prescribed: No diuretic  Recommendations: Left voice mail with ICM number and encouraged to call if experiencing any fluid symptoms.  Follow-up plan: ICM clinic phone appointment on 08/01/2018.   Office appointment scheduled 06/28/2018 with Tommye Standard, PA.    Copy of ICM check sent to Dr. Rayann Heman.   3 month ICM trend: 06/16/2018    1 Year ICM trend:       [name], RN 06/17/2018 9:16 AM

## 2018-06-22 ENCOUNTER — Encounter: Payer: Medicare HMO | Admitting: Nurse Practitioner

## 2018-06-26 NOTE — Progress Notes (Signed)
Cardiology Office Note Date:  06/28/2018  [name]:  Alexander Curtis, Alexander Curtis 11/21/1945, MRN [id number] PCP:  Rita Ohara, MD  Cardiologist:  Dr. Harrington Challenger Electrophysiologist: Dr. Rayann Heman    Chief Complaint: annual EP visit  History of Present Illness: Alexander Curtis is a 72 y.o. male with history of DM, HTN, OSA w/CPAP, LBBB, NICM, chronic CHF (mixed), advanced heart block w/CRT-P.  He comes in today to be seen for Dr. Rayann Heman, last seen by him in Nov 2018.  At that time, doing well, noted only short and rare disorganized atrial arrhythmia, no sustained AF and urged CPAP compliance.  No changes to his tx or programming made at this visit.  He comes today feeling very well, no CP, palpitations or SOB.  He feels like he has very good exertional capacity, but realizes his age as well.  No dizziness, near syncope or syncope.  He symptoms of PND or orthopnea.  Device information: SJM CRT-P, initial device implanted 07/01/14 >> LV lead/CRT upgrade 03/01/15  Past Medical History:  Diagnosis Date  . Atrial enlargement, left   . Diabetes mellitus, type 2 ([place])   . HLD (hyperlipidemia)   . Hypertension 05/2013  . LBBB (left bundle branch block)   . Melanoma ([place]) 1968   R arm  . Mitral regurgitation   . Mobitz type 2 second degree heart block    a. s/p Medtronic Adapta L model ADDRL 1 (serial number [id number]) pacemaker. 07/02/14 b.  upgrade to STJ CRTP 02/2015  . Non-ischemic cardiomyopathy ([place])    a. felt to be 2/2 RV pacing  . Obstructive sleep apnea    compliant with CPAP  . Pulmonary nodules    noted on CT of shoulder 03/2016, up to 54mm in size on CT chest 04/2016. repeat 1 year due to h/o melanoma  . Syncope 2011   a. 2011 -negative workup except LBBB. thought to be vasovagal  . Tricuspid regurgitation     Past Surgical History:  Procedure Laterality Date  . EP IMPLANTABLE DEVICE N/A 03/01/2015   STJ CRTP upgrade by Dr Rayann Heman  . FINGER TENDON REPAIR     R 2nd and 3rd finger  .  INGUINAL HERNIA REPAIR     bilateral  . KNEE ARTHROSCOPY  02/2011   L knee for meniscal tear. [name]  . PERMANENT PACEMAKER INSERTION N/A 07/01/2014   MDT ADDRL1 pacemaker implanted for Mobitz II Dr Rayann Heman  . UVULECTOMY     laser treatment for sleep apnea (NOT UP3)  . VASECTOMY      Current Outpatient Medications  Medication Sig Dispense Refill  . aspirin 81 MG tablet Take 81 mg by mouth every other day.    Marland Kitchen atorvastatin (LIPITOR) 40 MG tablet Take 40 mg by mouth daily.    Marland Kitchen b complex vitamins capsule Take 1 capsule by mouth daily.    . carvedilol (COREG) 6.25 MG tablet TAKE 1 TABLET(6.25 MG) BY MOUTH TWICE DAILY WITH A MEAL 180 tablet 1  . CINNAMON PO Take 2,000 mg by mouth 3 (three) times daily.     . Coenzyme Q10 (COQ10) 200 MG CAPS Take 1 capsule by mouth 2 (two) times daily.     . diphenhydrAMINE (BENADRYL) 50 MG tablet Take 50 mg by mouth at bedtime as needed for itching.    . fish oil-omega-3 fatty acids 1000 MG capsule Take 1 g by mouth 3 (three) times daily.     Marland Kitchen GABAPENTIN PO Take 1 tablet  by mouth 3 (three) times daily. Pt unsure of strenght    . lisinopril (PRINIVIL,ZESTRIL) 20 MG tablet Take 20 mg by mouth daily.    . Magnesium 500 MG TABS Take 500 mg by mouth daily.     . metFORMIN (GLUCOPHAGE) 500 MG tablet Take 500 mg by mouth daily as needed (dietary indiscretion).     . Multiple Vitamins-Minerals (MENS 50+ MULTI VITAMIN/MIN PO) Take 1 tablet by mouth daily.    . TURMERIC PO Take 1 tablet by mouth daily.    . vitamin C (ASCORBIC ACID) 500 MG tablet Take 500 mg by mouth daily.     No current facility-administered medications for this visit.    Facility-Administered Medications Ordered in Other Visits  Medication Dose Route Frequency Provider Last Rate Last Dose  . influenza  inactive virus vaccine (FLUZONE/FLUARIX) injection 0.5 mL  0.5 mL Intramuscular Once Rita Ohara, MD        Allergies:   Horse-derived products; Pravastatin; and Adhesive [tape]    Social History:  The patient  reports that he has never smoked. He has never used smokeless tobacco. He reports current alcohol use. He reports that he does not use drugs.   Family History:  The patient's family history includes Cancer in his mother; Dementia in his mother and paternal aunt; Diabetes in his maternal grandmother and sister; Heart attack in his maternal grandfather; Heart disease in his maternal grandfather; Melanoma (age of onset: 91) in his mother; Parkinson's disease in his maternal aunt; Stroke (age of onset: 67) in his father.  ROS:  Please see the history of present illness.    All other systems are reviewed and otherwise negative.   PHYSICAL EXAM:  VS:  BP 108/70   Pulse 70   Ht 6' (1.829 m)   Wt 210 lb (95.3 kg)   BMI 28.48 kg/m  BMI: Body mass index is 28.48 kg/m. Well nourished, well developed, in no acute distress  HEENT: normocephalic, atraumatic  Neck: no JVD, carotid bruits or masses Cardiac:  RRR; no significant murmurs, no rubs, or gallops Lungs:  CTA b/l, no wheezing, rhonchi or rales  Abd: soft, nontender MS: no deformity or atrophy Ext: no edema  Skin: warm and dry, no rash Neuro:  No gross deficits appreciated Psych: euthymic mood, full affect  PPM site is stable, no tethering or discomfort   EKG:  Done today and reviewed by myself SR, Vpaced PPM interrogation done today and reviewed by myself: battery and lead measurements are stable, chronically R waves 2-3, V sensitivity changed to auto.  One AFib episode 2 hours in May, HVR (2) and other AMS were 1:1  08/16/15: TTE Study Conclusions - Left ventricle: The cavity size was mildly dilated. Wall   thickness was normal. Systolic function was mildly reduced. The   estimated ejection fraction was in the range of 45% to 50%.   Diffuse hypokinesis. Doppler parameters are consistent with   abnormal left ventricular relaxation (grade 1 diastolic   dysfunction). - Aortic valve: There was trivial  regurgitation. - Mitral valve: There was mild regurgitation directed centrally. - Left atrium: The atrium was mildly dilated. - Pulmonary arteries: Systolic pressure was mildly increased. PA   peak pressure: 32 mm Hg (S). Impressions: - When compared to prior, there is mild improvement in EF (from   40%)  10/29/14 LVEF 35-40% 06/30/14: LVEF 55-60%  Recent Labs: 09/14/2017: Hemoglobin 14.4; Platelets 133; TSH 1.800  No results found for requested labs within last 8760  hours.   CrCl cannot be calculated (Patient's most recent lab result is older than the maximum 21 days allowed.).   Wt Readings from Last 3 Encounters:  06/28/18 210 lb (95.3 kg)  01/20/18 215 lb 6.4 oz (97.7 kg)  09/14/17 216 lb 3.2 oz (98.1 kg)     Other studies reviewed: Additional studies/records reviewed today include: summarized above  ASSESSMENT AND PLAN:  1. H/o advanced heart block, PPM >> CRT-P     Intact function     98% BiVe pacing  2. NICM 3. Chronic CHF (systolic), NYHA I-II     On BB, ACE    [place] is below threshold, he reports a cough/cold recently that is resolve, likely explains this, his weight is down with no symptoms or exam findings to suggest fluid OL      On BB/ACE  4. HTN      Looks good     Disposition: F/u with every 3 month remotes, one year in EP clinic sooner if needed.  Continue with Dr. Harrington Challenger.  Current medicines are reviewed at length with the patient today.  The patient did not have any concerns regarding medicines.  Venetia Night, PA-C 06/28/2018 9:27 AM     [address] [place] [address] [phone] (office)  [phone] (fax)

## 2018-06-28 ENCOUNTER — Ambulatory Visit: Payer: Medicare HMO | Admitting: Physician Assistant

## 2018-06-28 VITALS — BP 108/70 | HR 70 | Ht 72.0 in | Wt 210.0 lb

## 2018-06-28 DIAGNOSIS — I5042 Chronic combined systolic (congestive) and diastolic (congestive) heart failure: Secondary | ICD-10-CM | POA: Diagnosis not present

## 2018-06-28 DIAGNOSIS — I428 Other cardiomyopathies: Secondary | ICD-10-CM | POA: Diagnosis not present

## 2018-06-28 DIAGNOSIS — Z950 Presence of cardiac pacemaker: Secondary | ICD-10-CM

## 2018-06-28 DIAGNOSIS — I441 Atrioventricular block, second degree: Secondary | ICD-10-CM

## 2018-06-28 NOTE — Patient Instructions (Signed)
Medication Instructions:   Your physician recommends that you continue on your current medications as directed. Please refer to the Current Medication list given to you today.  If you need a refill on your cardiac medications before your next appointment, please call your pharmacy.   Lab work: NONE ORDERED  TODAY  If you have labs (blood work) drawn today and your tests are completely normal, you will receive your results only by: Marland Kitchen MyChart Message (if you have MyChart) OR . A paper copy in the mail If you have any lab test that is abnormal or we need to change your treatment, we will call you to review the results.  Testing/Procedures:  NONE ORDERED  TODAY   Follow-Up: At [facility], you and your health needs are our priority.  As part of our continuing mission to provide you with exceptional heart care, we have created designated Provider Care Teams.  These Care Teams include your primary Cardiologist (physician) and Advanced Practice Providers (APPs -  Physician Assistants and Nurse Practitioners) who all work together to provide you with the care you need, when you need it. You will need a follow up appointment in 1 years.  Please call our office 2 months in advance to schedule this appointment.  You may see  Dr. Rayann Heman.* or one of the following Advanced Practice Providers on your designated Care Team:   Chanetta Marshall, NP . Tommye Standard, PA-C  Remote monitoring is used to monitor your Pacemaker of ICD from home. This monitoring reduces the number of office visits required to check your device to one time per year. It allows [place] to keep an eye on the functioning of your device to ensure it is working properly. You are scheduled for a device check from home on .  08-01-18 You may send your transmission at any time that day. If you have a wireless device, the transmission will be sent automatically. After your physician reviews your transmission, you will receive a postcard with your next  transmission date.   Any Other Special Instructions Will Be Listed Below (If Applicable).

## 2018-07-28 ENCOUNTER — Telehealth: Payer: Self-pay

## 2018-07-28 NOTE — Telephone Encounter (Signed)
Received voice mail message from patient requesting ICM remote transmission date change from 08-01-2018 until after 08-03-2018 due to he will be out of town.  Rescheduled for 08-09-2018.

## 2018-08-02 ENCOUNTER — Ambulatory Visit (INDEPENDENT_AMBULATORY_CARE_PROVIDER_SITE_OTHER): Payer: Medicare HMO

## 2018-08-02 DIAGNOSIS — I5042 Chronic combined systolic (congestive) and diastolic (congestive) heart failure: Secondary | ICD-10-CM

## 2018-08-02 DIAGNOSIS — Z950 Presence of cardiac pacemaker: Secondary | ICD-10-CM | POA: Diagnosis not present

## 2018-08-02 NOTE — Progress Notes (Signed)
EPIC Encounter for ICM Monitoring  Patient Name: Alexander Curtis is a 73 y.o. male Date: 08/02/2018 Primary Care Physican: Rita Ohara, MD Primary Cardiologist:Ross Electrophysiologist: Allred Bi-V Pacing: 98% Last Weight:209lbs  Today's Weight: 204 lbs       Heart Failure questions reviewed.   Pt asymptomatic.   Report: Thoracic impedance normal.   Prescribed: No diuretic  Recommendations: No changes.   Encouraged to call for fluid symptoms.  Follow-up plan: ICM clinic phone appointment on 09/05/2018.   Office appointment scheduled 09/05/2018 with Dr. Harrington Challenger.    Copy of ICM check sent to Dr. Rayann Heman.   3 month ICM trend: 08/02/2018    1 Year ICM trend:       [name], RN 08/02/2018 4:46 PM

## 2018-09-05 ENCOUNTER — Encounter: Payer: Self-pay | Admitting: Internal Medicine

## 2018-09-05 ENCOUNTER — Ambulatory Visit: Payer: Medicare HMO | Admitting: Internal Medicine

## 2018-09-05 ENCOUNTER — Ambulatory Visit (INDEPENDENT_AMBULATORY_CARE_PROVIDER_SITE_OTHER): Payer: Medicare HMO

## 2018-09-05 VITALS — BP 116/60 | HR 74 | Ht 72.0 in | Wt 214.8 lb

## 2018-09-05 DIAGNOSIS — I5042 Chronic combined systolic (congestive) and diastolic (congestive) heart failure: Secondary | ICD-10-CM | POA: Diagnosis not present

## 2018-09-05 DIAGNOSIS — E782 Mixed hyperlipidemia: Secondary | ICD-10-CM | POA: Diagnosis not present

## 2018-09-05 DIAGNOSIS — Z79899 Other long term (current) drug therapy: Secondary | ICD-10-CM | POA: Diagnosis not present

## 2018-09-05 DIAGNOSIS — I10 Essential (primary) hypertension: Secondary | ICD-10-CM | POA: Diagnosis not present

## 2018-09-05 DIAGNOSIS — Z950 Presence of cardiac pacemaker: Secondary | ICD-10-CM

## 2018-09-05 NOTE — Patient Instructions (Signed)
Medication Instructions:  The current medical regimen is effective;  continue present plan and medications.  If you need a refill on your cardiac medications before your next appointment, please call your pharmacy.   Follow-Up: At [facility], you and your health needs are our priority.  As part of our continuing mission to provide you with exceptional heart care, we have created designated Provider Care Teams.  These Care Teams include your primary Cardiologist (physician) and Advanced Practice Providers (APPs -  Physician Assistants and Nurse Practitioners) who all work together to provide you with the care you need, when you need it. You will need a follow up appointment in:  12 months.  Please call our office 2 months in advance to schedule this appointment.  You may see Dr Dorris Carnes or one of the following Advanced Practice Providers on your designated Care Team: Richardson Dopp, PA-C [place], [place] . Daune Perch, NP  Thank you for choosing [facility]!!

## 2018-09-05 NOTE — Progress Notes (Signed)
Cardiology Office Note   Date:  09/05/2018   ID:  Alexander, Curtis 1945-09-12, MRN [id number]  PCP:  Alexander Ohara, MD  Cardiologist:   Alexander Carnes, MD   Pt presents for follow up ofNICM      History of Present Illness: Alexander Curtis is a 73 y.o. male with a history of high degree AV block   S/P PPM 2011  Echo I n 2016 LVEF 35 to 40%  NICM) Upgraded to BiV pacer.  Echo repeat LVEF 45 to 50%   Also followed by Alexander Curtis I saw the pt in Feb 2019   He was seen in EP last fall  He remains very active   One time had a spell of SOB but that was after a long day outside   Able to walk up and down steps without a problem      Current Meds  Medication Sig  . aspirin 81 MG tablet Take 81 mg by mouth every other day.  Marland Kitchen atorvastatin (LIPITOR) 40 MG tablet Take 40 mg by mouth daily.  Marland Kitchen b complex vitamins capsule Take 1 capsule by mouth daily.  . carvedilol (COREG) 6.25 MG tablet TAKE 1 TABLET(6.25 MG) BY MOUTH TWICE DAILY WITH A MEAL  . CINNAMON PO Take 2,000 mg by mouth 3 (three) times daily.   . Coenzyme Q10 (COQ10) 200 MG CAPS Take 1 capsule by mouth 2 (two) times daily.   . diphenhydrAMINE (BENADRYL) 50 MG tablet Take 50 mg by mouth at bedtime as needed for itching.  . fish oil-omega-3 fatty acids 1000 MG capsule Take 1 g by mouth 3 (three) times daily.   Marland Kitchen GABAPENTIN PO Take 1 tablet by mouth 3 (three) times daily. Pt unsure of strenght  . lisinopril (PRINIVIL,ZESTRIL) 20 MG tablet Take 20 mg by mouth daily.  . Magnesium 500 MG TABS Take 500 mg by mouth daily.   . metFORMIN (GLUCOPHAGE) 500 MG tablet Take 500 mg by mouth daily as needed (dietary indiscretion).   . Multiple Vitamins-Minerals (MENS 50+ MULTI VITAMIN/MIN PO) Take 1 tablet by mouth daily.  . TURMERIC PO Take 1 tablet by mouth daily.  . vitamin C (ASCORBIC ACID) 500 MG tablet Take 500 mg by mouth daily.     Allergies:   Horse-derived products; Pravastatin; and Adhesive [tape]   Past Medical History:  Diagnosis Date  .  Atrial enlargement, left   . Diabetes mellitus, type 2 ([place])   . HLD (hyperlipidemia)   . Hypertension 05/2013  . LBBB (left bundle branch block)   . Melanoma ([place]) 1968   R arm  . Mitral regurgitation   . Mobitz type 2 second degree heart block    a. s/p Medtronic Adapta L model ADDRL 1 (serial number [id number]) pacemaker. 07/02/14 b.  upgrade to STJ CRTP 02/2015  . Non-ischemic cardiomyopathy ([place])    a. felt to be 2/2 RV pacing  . Obstructive sleep apnea    compliant with CPAP  . Pulmonary nodules    noted on CT of shoulder 03/2016, up to 25mm in size on CT chest 04/2016. repeat 1 year due to h/o melanoma  . Syncope 2011   a. 2011 -negative workup except LBBB. thought to be vasovagal  . Tricuspid regurgitation     Past Surgical History:  Procedure Laterality Date  . EP IMPLANTABLE DEVICE N/A 03/01/2015   STJ CRTP upgrade by Dr Alexander Curtis  . FINGER TENDON REPAIR     R 2nd  and 3rd finger  . INGUINAL HERNIA REPAIR     bilateral  . KNEE ARTHROSCOPY  02/2011   L knee for meniscal tear. Dr. Ninfa Curtis  . PERMANENT PACEMAKER INSERTION N/A 07/01/2014   MDT ADDRL1 pacemaker implanted for Mobitz II Dr Alexander Curtis  . UVULECTOMY     laser treatment for sleep apnea (NOT UP3)  . VASECTOMY       Social History:  The patient  reports that he has never smoked. He has never used smokeless tobacco. He reports current alcohol use. He reports that he does not use drugs.   Family History:  The patient's family history includes Cancer in his mother; Dementia in his mother and paternal aunt; Diabetes in his maternal grandmother and sister; Heart attack in his maternal grandfather; Heart disease in his maternal grandfather; Melanoma (age of onset: 67) in his mother; Parkinson's disease in his maternal aunt; Stroke (age of onset: 25) in his father.    ROS:  Please see the history of present illness. All other systems are reviewed and  Negative to the above problem except as noted.    PHYSICAL EXAM: VS:   BP 116/60   Pulse 74   Ht 6' (1.829 m)   Wt 214 lb 12.8 oz (97.4 kg)   SpO2 95%   BMI 29.13 kg/m   GEN: Well nourished, well developed, in no acute distress  HEENT: normal  Neck: JVP normal  No carotid bruits, or masses Cardiac: RRR; no murmurs, rubs, or gallops,no edema  Respiratory:  clear to auscultation bilaterally, normal work of breathing GI: soft, nontender, nondistended, + BS  No hepatomegaly  MS: no deformity Moving all extremities   Skin: warm and dry, no rash Neuro:  Strength and sensation are intact Psych: euthymic mood, full affect   EKG:  EKG is not ordered today.   Lipid Panel    Component Value Date/Time   CHOL 127 09/02/2015 0001   TRIG 65 09/02/2015 0001   HDL 40 09/02/2015 0001   CHOLHDL 3.2 09/02/2015 0001   VLDL 13 09/02/2015 0001   LDLCALC 74 09/02/2015 0001      Wt Readings from Last 3 Encounters:  09/05/18 214 lb 12.8 oz (97.4 kg)  06/28/18 210 lb (95.3 kg)  01/20/18 215 lb 6.4 oz (97.7 kg)      ASSESSMENT AND PLAN:  1  Chronic systolic CHF   Mild LV dysfunciton on echo   S/P BiV pacer   Doing very good    2  AV block  Initially s/p PPM  Then upgrade to BiV PPM  Follows with Alexander Curtis   3  HL  Continue statin Need to get labs from [place]  F/U in 1 year     Current medicines are reviewed at length with the patient today.  The patient does not have concerns regarding medicines.  Signed, Alexander Carnes, MD  09/05/2018 9:57 AM    [address], [place], [place]  [address] Phone: [phone]; Fax: [phone]

## 2018-09-06 NOTE — Progress Notes (Signed)
EPIC Encounter for ICM Monitoring  Patient Name: ARIZONA NORDQUIST is a 73 y.o. male Date: 09/06/2018 Primary Care Physican: Rita Ohara, MD Primary Cardiologist:Ross Electrophysiologist: Allred Bi-V Pacing: 98% LastWeight:204lbs Today's Weight: unknown                                                           Heart Failure questions reviewed.   Pt asymptomatic.   Report: Thoracic impedance slightly below baseline suggesting fluid accumulation.  Decreased impedance also 08/17/2018 - 08/23/2018.   Prescribed: No diuretic  Recommendations: No changes.   Encouraged to call for fluid symptoms.  Follow-up plan: ICM clinic phone appointment on 10/10/2018.      Copy of ICM check sent to Dr. Rayann Heman.   3 month ICM trend: 09/05/2018    1 Year ICM trend:       [name], RN 09/06/2018 12:56 PM

## 2018-09-19 ENCOUNTER — Ambulatory Visit: Payer: Medicare HMO | Admitting: Family Medicine

## 2018-10-10 ENCOUNTER — Ambulatory Visit (INDEPENDENT_AMBULATORY_CARE_PROVIDER_SITE_OTHER): Payer: No Typology Code available for payment source

## 2018-10-10 ENCOUNTER — Other Ambulatory Visit: Payer: Self-pay

## 2018-10-10 DIAGNOSIS — Z950 Presence of cardiac pacemaker: Secondary | ICD-10-CM

## 2018-10-10 DIAGNOSIS — I5042 Chronic combined systolic (congestive) and diastolic (congestive) heart failure: Secondary | ICD-10-CM

## 2018-10-12 ENCOUNTER — Telehealth: Payer: Self-pay

## 2018-10-12 NOTE — Progress Notes (Signed)
EPIC Encounter for ICM Monitoring  Patient Name: Alexander Curtis is a 73 y.o. male Date: 10/12/2018 Primary Care Physican: Rita Ohara, MD Primary Cardiologist:Ross Electrophysiologist: Allred Bi-V Pacing: 98% LastWeight:204lbs   Attempted call to patient and unable to reach.  Left detailed message per DPR regarding transmission. Transmission reviewed.   Pt asymptomatic.  Report: Thoracic impedance normal.  Prescribed:No diuretic  Recommendations:Left voice mail with ICM number and encouraged to call if experiencing any fluid symptoms.  Follow-up plan: ICM clinic phone appointment on5/10/2018.   Copy of ICM check sent to Dr.Allred.   3 month ICM trend: 10/10/2018    1 Year ICM trend:       [name], RN 10/12/2018 4:38 PM

## 2018-10-12 NOTE — Telephone Encounter (Signed)
Remote ICM transmission received.  Attempted call to patient regarding ICM remote transmission and left detailed message, per DPR, with next ICM remote transmission date of 11/14/2018.  Advised to return call for any fluid symptoms or questions.    

## 2018-11-09 ENCOUNTER — Other Ambulatory Visit: Payer: Self-pay

## 2018-11-09 ENCOUNTER — Encounter (INDEPENDENT_AMBULATORY_CARE_PROVIDER_SITE_OTHER): Payer: Self-pay | Admitting: Orthopaedic Surgery

## 2018-11-09 ENCOUNTER — Ambulatory Visit (INDEPENDENT_AMBULATORY_CARE_PROVIDER_SITE_OTHER): Payer: Medicare HMO

## 2018-11-09 ENCOUNTER — Ambulatory Visit (INDEPENDENT_AMBULATORY_CARE_PROVIDER_SITE_OTHER): Payer: Medicare HMO | Admitting: Orthopaedic Surgery

## 2018-11-09 DIAGNOSIS — G8929 Other chronic pain: Secondary | ICD-10-CM

## 2018-11-09 DIAGNOSIS — M25511 Pain in right shoulder: Secondary | ICD-10-CM

## 2018-11-09 MED ORDER — METHYLPREDNISOLONE ACETATE 40 MG/ML IJ SUSP
40.0000 mg | INTRAMUSCULAR | Status: AC | PRN
  Administered 2018-11-09: 40 mg via INTRA_ARTICULAR

## 2018-11-09 MED ORDER — LIDOCAINE HCL 1 % IJ SOLN
3.0000 mL | INTRAMUSCULAR | Status: AC | PRN
  Administered 2018-11-09: 3 mL

## 2018-11-09 NOTE — Progress Notes (Signed)
Office Visit Note   Patient: Alexander Curtis           Date of Birth: [birth year]           MRN: [id number] Visit Date: 11/09/2018              Requested by: Rita Ohara, [address], [place] [id number] PCP: Rita Ohara, MD   Assessment & Plan: Visit Diagnoses:  1. Chronic right shoulder pain     Plan: [name] will monitor his glucose levels as he understands that the cortisone injection was given today may cause these to increase.  We will obtain a CT of his right shoulder to evaluate for glenohumeral arthritic changes.  He will follow-up with Dr. Marlou Sa after his right shoulder CT scan to go over the results and discuss further treatment.  Also discussed his response to the right shoulder subacromial injection.  He has been to physical therapy in the past for his shoulder and has Thera-Band at home.  I did review wall crawls, forward flexion exercises, Codman and pendulum exercises with him today.  Questions were encouraged and answered at length.  Follow-Up Instructions: Return Dr. Marlou Sa after CT.   Orders:  Orders Placed This Encounter  Procedures  . Large Joint Inj  . XR Shoulder Right   No orders of the defined types were placed in this encounter.     Procedures: Large Joint Inj: R subacromial bursa on 11/09/2018 12:44 PM Indications: pain Details: 22 G 1.5 in needle, superior approach  Arthrogram: No  Medications: 3 mL lidocaine 1 %; 40 mg methylPREDNISolone acetate 40 MG/ML Outcome: tolerated well, no immediate complications Procedure, treatment alternatives, risks and benefits explained, specific risks discussed. Consent was given by the patient. Immediately prior to procedure a time out was called to verify the correct patient, procedure, equipment, support staff and site/side marked as required. Patient was prepped and draped in the usual sterile fashion.       Clinical Data: No additional findings.   Subjective: Chief Complaint  Patient  presents with  . Right Shoulder - Pain    HPI Mr. Alexander Curtis is a 73 year old male who is [name] seen back in 2015 comes in with new complaint of right shoulder pain is been ongoing for years.  He states whenever he is playing baseball when to go to throw with his right arm in 1968 he has had pain in the arm.  He states his been told he has a "bone chip in his shoulder and is undergone injections in the shoulder in the past sounds as if they were subacromial which helped for prolonged period time he is also seen an orthopedic surgeon here in town who gave him his shoulder injection which lasted for a year to year and a half.  This injection was given about a year and a half ago.  He has had physical therapy which has helped some.  No acute injury to the shoulder.  However he has been doing some work [place] with [place] and actually doing some very paving of the driveway.  Patient is diabetic reports his last hemoglobin A1c was 6.5.  He also has noticed bees type II second-degree heart block along with a left bundle branch block, tricuspid regurgitation and mitral valve regurg patient.  Had a pacemaker placed. States his shoulder is decreasing in strength and range of motion.  Question if he can have a cortisone injection in the shoulder.  He denies  any numbness tingling down the arm.  Review of Systems Negative for fevers or chills  Objective: Vital Signs: There were no vitals taken for this visit.  Physical Exam Constitutional:      Appearance: He is not ill-appearing or diaphoretic.  Pulmonary:     Effort: Pulmonary effort is normal.  Neurological:     Mental Status: He is alert and oriented to person, place, and time.  Psychiatric:        Mood and Affect: Mood normal.     Ortho Exam Bilateral shoulders he has any positive impingement sign on the right negative on the left.  External/internal rotation against resistance reveals about a 5 strength bilaterally.  Empty can test is  negative on the left he has slight weakness on the right .  Left shoulder full forward flexion abduction.  Limited forward flexion by approximately 10 to 15 degrees and abduction by approximately 40 degrees actively.  Passively can bring 180 degrees of full flexion the right and 160 degrees of abduction.  Tenderness bicipital groove and over the greater tuberosity region of the right shoulder. Specialty Comments:  No specialty comments available.  Imaging: Xr Shoulder Right  Result Date: 11/09/2018 Right shoulder 3 views: Shoulders well located.  No acute fracture.  Glenoid appears sclerotic consistent with arthritic changes.  Calcific changes consistent with calcific tendinitis are seen.    PMFS History: Patient Active Problem List   Diagnosis Date Noted  . Diabetic peripheral neuropathy ([place]) 01/24/2017  . Pulmonary nodules 05/07/2016  . Nonischemic cardiomyopathy ([place]) 01/18/2015  . Pacemaker   . Mobitz type 2 second degree heart block   . Tricuspid regurgitation   . Mitral regurgitation   . Obstructive sleep apnea   . Syncope   . HLD (hyperlipidemia)   . LBBB (left bundle branch block)   . Bradycardia 06/30/2014  . Noncompliance with medications 04/12/2014  . Type 2 diabetes mellitus, controlled ([place]) 01/12/2014  . Essential hypertension, benign 07/26/2013  . Hypertension 05/13/2013  . Impaired fasting glucose 01/04/2012  . Pure hypercholesterolemia 08/05/2011   Past Medical History:  Diagnosis Date  . Atrial enlargement, left   . Diabetes mellitus, type 2 ([place])   . HLD (hyperlipidemia)   . Hypertension 05/2013  . LBBB (left bundle branch block)   . Melanoma ([place]) 1968   R arm  . Mitral regurgitation   . Mobitz type 2 second degree heart block    a. s/p Medtronic Adapta L model ADDRL 1 (serial number [id number]) pacemaker. 07/02/14 b.  upgrade to STJ CRTP 02/2015  . Non-ischemic cardiomyopathy ([place])    a. felt to be 2/2 RV pacing  . Obstructive sleep apnea     compliant with CPAP  . Pulmonary nodules    noted on CT of shoulder 03/2016, up to 76mm in size on CT chest 04/2016. repeat 1 year due to h/o melanoma  . Syncope 2011   a. 2011 -negative workup except LBBB. thought to be vasovagal  . Tricuspid regurgitation     Family History  Problem Relation Age of Onset  . Dementia Mother   . Cancer Mother        ?type, was told it contributed to death, per pt  . Melanoma Mother 56  . Stroke Father 63       cerebral aneurysm  . Diabetes Sister        borderline, obese  . Diabetes Maternal Grandmother   . Heart disease Maternal Grandfather   . Heart  attack Maternal Grandfather   . Dementia Paternal Aunt   . Parkinson's disease Maternal Aunt   . Hypertension Neg Hx     Past Surgical History:  Procedure Laterality Date  . EP IMPLANTABLE DEVICE N/A 03/01/2015   STJ CRTP upgrade by Dr [place]ann Heman  . FINGER TENDON REPAIR     R 2nd and 3rd finger  . INGUINAL HERNIA REPAIR     bilateral  . KNEE ARTHROSCOPY  02/2011   L knee for meniscal tear. [name]  . PERMANENT PACEMAKER INSERTION N/A 07/01/2014   MDT ADDRL1 pacemaker implanted for Mobitz II Dr [place]ann Heman  . UVULECTOMY     laser treatment for sleep apnea (NOT UP3)  . VASECTOMY     Social History   Occupational History  . Occupation: retired  Tobacco Use  . Smoking status: Never Smoker  . Smokeless tobacco: Never Used  Substance and Sexual Activity  . Alcohol use: Yes    Comment: 3-4 drinks per month.  . Drug use: No  . Sexual activity: Not Currently    Partners: Female

## 2018-11-10 ENCOUNTER — Other Ambulatory Visit (INDEPENDENT_AMBULATORY_CARE_PROVIDER_SITE_OTHER): Payer: Self-pay

## 2018-11-10 DIAGNOSIS — M25511 Pain in right shoulder: Principal | ICD-10-CM

## 2018-11-10 DIAGNOSIS — G8929 Other chronic pain: Secondary | ICD-10-CM

## 2018-11-14 ENCOUNTER — Other Ambulatory Visit: Payer: Self-pay

## 2018-11-15 ENCOUNTER — Telehealth: Payer: Self-pay

## 2018-11-15 NOTE — Telephone Encounter (Signed)
Left message for patient to remind of missed remote transmission.  

## 2018-11-22 NOTE — Progress Notes (Signed)
No ICM remote transmission received for 11/14/2018 and next ICM transmission scheduled for 12/12/2018.   

## 2018-11-23 ENCOUNTER — Ambulatory Visit (INDEPENDENT_AMBULATORY_CARE_PROVIDER_SITE_OTHER): Payer: No Typology Code available for payment source

## 2018-11-23 ENCOUNTER — Other Ambulatory Visit: Payer: Self-pay

## 2018-11-23 DIAGNOSIS — I5042 Chronic combined systolic (congestive) and diastolic (congestive) heart failure: Secondary | ICD-10-CM | POA: Diagnosis not present

## 2018-11-23 DIAGNOSIS — Z950 Presence of cardiac pacemaker: Secondary | ICD-10-CM | POA: Diagnosis not present

## 2018-11-25 ENCOUNTER — Ambulatory Visit
Admission: RE | Admit: 2018-11-25 | Discharge: 2018-11-25 | Disposition: A | Discharge status: Home / Self Care | Payer: Medicare HMO | Source: Ambulatory Visit | Attending: Orthopedic Surgery | Admitting: Orthopedic Surgery

## 2018-11-25 DIAGNOSIS — G8929 Other chronic pain: Secondary | ICD-10-CM

## 2018-11-25 DIAGNOSIS — M25511 Pain in right shoulder: Secondary | ICD-10-CM

## 2018-11-25 NOTE — Progress Notes (Signed)
EPIC Encounter for ICM Monitoring  Patient Name: Alexander Curtis is a 73 y.o. male Date: 11/25/2018 Primary Care Physican: Rita Ohara, MD Primary Cardiologist:Ross Electrophysiologist: Allred Bi-V Pacing: 97% LastWeight:204lbs   Transmission reviewed and patient asymptomatic.  Pt rode [place] (11[place]19/2018[place] which is [place] and has sharpe curves.  About 20 minutes after the ride he experienced sharp pain for about 20 seconds on left side chest where the leads are located. The morning of the ride it was 37 degrees when he was on the ride.  He had no further symptoms other than the 1 episode.  He asked if this could have been caused by the coaster ride.   Pt asymptomatic.  Report: Thoracic impedancenormal.  Prescribed:No diuretic  Recommendations:Left voice mail with ICM number and encouraged to call if experiencing any fluid symptoms.  Follow-up plan: ICM clinic phone appointment on6/15/2020.   Copy of ICM check sent to Dr.Allred.  3 month ICM trend: 11/25/2018    1 Year ICM trend:       [name], RN 11/25/2018 10:29 AM

## 2018-11-28 NOTE — Progress Notes (Signed)
Call back to patient.  Advised discussed the pain with Chanetta Marshall, NP and she said the alpine coaster ride should not have caused any problems.  If the pain returns or he has other symptoms to call back with date and time so a device report can be checked for any abnormalities.

## 2018-12-06 NOTE — Patient Instructions (Addendum)
  HEALTH MAINTENANCE RECOMMENDATIONS:  It is recommended that you get at least 30 minutes of aerobic exercise at least 5 days/week (for weight loss, you may need as much as 60-90 minutes). This can be any activity that gets your heart rate up. This can be divided in 10-15 minute intervals if needed, but try and build up your endurance at least once a week.  Weight bearing exercise is also recommended twice weekly.  Eat a healthy diet with lots of vegetables, fruits and fiber.  "Colorful" foods have a lot of vitamins (ie green vegetables, tomatoes, red peppers, etc).  Limit sweet tea, regular sodas and alcoholic beverages, all of which has a lot of calories and sugar.  Up to 2 alcoholic drinks daily may be beneficial for men (unless trying to lose weight, watch sugars).  Drink a lot of water.  Sunscreen of at least SPF 30 should be used on all sun-exposed parts of the skin when outside between the hours of 10 am and 4 pm (not just when at beach or pool, but even with exercise, golf, tennis, and yard work!)  Use a sunscreen that says "broad spectrum" so it covers both UVA and UVB rays, and make sure to reapply every 1-2 hours.  Remember to change the batteries in your smoke detectors when changing your clock times in the spring and fall. Carbon monoxide detectors are recommended for your home.  Use your seat belt every time you are in a car, and please drive safely and not be distracted with cell phones and texting while driving.   Alexander Curtis , Thank you for taking time to come for your Medicare Wellness Visit. I appreciate your ongoing commitment to your health goals. Please review the following plan we discussed and let me know if I can assist you in the future.   This is a list of the screening recommended for you and due dates:  Health Maintenance  Topic Date Due  . Hemoglobin A1C  01/09/2018  . Complete foot exam   09/15/2018  . Eye exam for diabetics  10/14/2018  . Colon Cancer Screening   10/15/2018  . Flu Shot  02/11/2019  .  Hepatitis C: One time screening is recommended by Center for Disease Control  (CDC) for  adults born from [birth year] through 1966.   Completed  . Pneumonia vaccines  Completed   Your diabetes is being treated by the [place], last A1c was in 08/2018 (ignore date above), due again in August 2020 (every 6 months). Continue yearly foot exams and eye exams (and please have them send [place] the reports, so they get entered into your chart).  Please continue to get high dose flu shots every Fall (September/October)  I recommend getting the new shingles vaccine (Shingrix). You will need to either get this from the [place], or get from the pharmacy rather (it is covered by Medicare part D).  It is a series of 2 injections, spaced 2 months apart.  Contact [facility] to schedule your follow-up colonoscopy (due now, when safe/allowed to do so, due to the coronavirus pandemic)  Follow up with me yearly (someone will contact you to schedule this).  Thanks for your flexibility with doing the visit virtually this year. Stay safe and stay well!

## 2018-12-06 NOTE — Progress Notes (Signed)
Start time: 2:01 End time: 2:51   Virtual Visit via Telephone Note  I connected with Alexander Curtis on 12/06/18 at  2:00 PM EDT by telephone--his wife's cellphone broke, and had no way to perform video visit, so telephone visit was performed instead. I verified that I am speaking with the correct person using two identifiers.  Location: Patient: home in [place], in room alone Provider: home office   I discussed the limitations of evaluation and management by telemedicine and the availability of in person appointments. The patient expressed understanding and agreed to proceed. He consents to [place] filing his insurance for this visit.  History of Present Illness:  Chief Complaint  Patient presents with  . Medicare Wellness    PHONE CALL AWV. Did have another cortisone shot R shoulder-[facility], [name]. Had CT R shoulder with Dr. Marlou Sa in same office. Had lung CT follow up and found thyroid nodule-having CT July 6th.     Patient presents for [facility] Annual Wellness Visit, and f/u on chronic problems. He is followed by the [facility] (and gets medications from them), forwards his labwork here, and is seen here just once yearly.  Labs done at the [facility] 08/2018:  A1c 6.5, nl CBC (plt 137, WBC 3.9), LDL 55   Hyperlipidemia: Patient has been taking lipitor 40mg  regularly, as well as fish oil.He is trying to follow a lowfat, low cholesterol diet.  Lipids at goal, LDL 55 in 08/2018 at [facility].  Diabetes--he was originally prescribed metformin back in October 2015, but didn't take it (no side effects, just didn't want to take). He finally started taking the medication in summer 2018, after he started having neuropathy in his feet.  He denies side effects. Blood sugars  78-129, usually around 109. A1c was 6.5 at [place] in 08/2018. Denies polydipsia, polyuria, hypoglycemia; diabetic eye exam is done at [place], UTD. Neuropathy in hands/feet (pins and needles, relieved by moving hands; hands are more bothersome  than the feet). He is treated with gabapentin for the neuropathy (by [place]). He had abnormal monofilament exam at [facility], and has since seen podiatrists and ortho for his feet (unclear if truly has [place] vs other cause; he has not had injection, Dr. Trevor Mace PA didn't think dx was correct).  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 He gets regular pacemaker checks, and is working well. No longer is having any fatigue or dyspnea on exertion.  CHF/cardiomyopathy:stable/controlled. No longer on any diuretics.He denies edema, dyspnea, chest pain. He saw Dr. Harrington Challenger last in 08/2018 and no changes were made.   Hypertension: BP's haven't been monitored (no longer has his monitor). BP's have been fine at other doctor offices, 120's/70's, sometimes a little lower. Taking lisinopril 20mg  daily. No headaches, dizziness, chest pain, cough or other side effects of medication.  Hearing loss: has hearing aids. H/o skin cancers. Under the care of dermatologist.  Sleep apnea--[name] compliant withusing CPAP and tolerating well without problems.  He denies unrefreshed sleep or daytime somnolence.  In 09/2017, he reported that his R shoulder pain had improved significantly after cortisone injection and PT.  At that time, an incidental finding of 98mm pulmonary nodule was noted. Due to h/o melanoma, it was recommended he repeat the CT in 1 year.  He had the f/u and found another nodule (5th nodule, per pt), so got more regular follow-ups with CT's through [place]. He reports that with his last CT (October/November), a  nodule was noted on his thyroid (he was only made aware of this by the new MD assigned to him at the [place], more recently), and is scheduled for evaluation 7/6. Chest CTs are being monitored yearly.  He saw [name] again in 10/2018 with recurrent R shoulder pain.   Xrays showed calcific tendonitis and arthritis. He was  treated with cortisone injection. This helped.  Has recurrent pain with overuse, not bothering him much since the injection. Has ROM exercises (he is s/p PT at [place]), has been doing them.  He had CT 11/25/2018: IMPRESSION: 1. Mild glenohumeral osteoarthritis. 2. Mild to moderate acromioclavicular osteoarthritis.  Immunization History  Administered Date(s) Administered  . Influenza Split 05/02/2012  . Influenza, High Dose Seasonal PF 04/12/2014, 05/07/2016, 09/14/2017  . Influenza-Unspecified 03/28/2015  . Pneumococcal Conjugate-13 08/14/2015  . Pneumococcal Polysaccharide-23 03/28/2012  . Zoster 12/31/2011   He had flu shot at the [place] this year. allergic to tetanus  Last colonoscopy:10/2016 at [facility] (Dr. B) which showed internal hemorrhoids, diverticulosis, nonbleeding angio ectasia; 2 yr follow-up was recommended due to poor prep. Last PSA 09/2017, normal at 0.4. RecentLabs       Lab Results  Component Value Date   PSA 0.5 08/17/2016   PSA 0.48 09/02/2015   PSA 0.52 07/16/2014     Dentist: twice yearly (at the [place] also) Ophtho:yearly at the [facility] Exercise: very active with mountain home (put asphalt down on long driveway, mows (propelled push-mower).  Walks dog (80 lb boxer) 2-4x per day, or walks with his wife without the dog. Walks frequently (30-45 minutes or more with the dog, 45 minutes without them sometimes daily). Hepatitis C screen done at [facility] 01/2016  Other doctors caring for patient include: Cardiology: Dr. Harrington Challenger, Dr. Rayann Heman Ophtho: at [facility] GI: [place] [facility](can't recall the name of the new PCP, no longer Dr. Dierdre Searles, switched a few times) Dentist: at the [facility] Dermatologist: at [facility], every 6 months (Dr. Verner Chol?) Ortho: [name], Dr. Veverly Fells Podiatrist: Dr. Su Hoff, saw Dr. Joya Gaskins at [facility] 11/2017.   Depression screen: Negative Fall screen: negative Functional status screen--notable for only slightly blurry vision from cataracts (mild),  decreased hearing (has bilateral hearing aids). See full screen in epic. Mini-Cog: normal recall of 3 words; unable to do clock drawing due to lack of video.  End of Life Discussion: Patient has a living will and medical power of attorney  Past Medical History:  Diagnosis Date  . Arthritis of shoulder    right (CT done 11/2018)  . Atrial enlargement, left   . Diabetes mellitus, type 2 ([place])   . HLD (hyperlipidemia)   . Hypertension 05/2013  . LBBB (left bundle branch block)   . Melanoma ([place]) 1968   R arm  . Mitral regurgitation   . Mobitz type 2 second degree heart block    a. s/p Medtronic Adapta L model ADDRL 1 (serial number [id number]) pacemaker. 07/02/14 b.  upgrade to STJ CRTP 02/2015  . Non-ischemic cardiomyopathy ([place])    a. felt to be 2/2 RV pacing  . Obstructive sleep apnea    compliant with CPAP  . Pulmonary nodules    noted on CT of shoulder 03/2016, up to 29mm in size on CT chest 04/2016. repeat 1 year due to h/o melanoma  . Syncope 2011   a. 2011 -negative workup except LBBB. thought to be vasovagal  . Tricuspid regurgitation     Past Surgical History:  Procedure Laterality Date  . EP  IMPLANTABLE DEVICE N/A 03/01/2015   STJ CRTP upgrade by Dr Rayann Heman  . FINGER TENDON REPAIR     R 2nd and 3rd finger  . INGUINAL HERNIA REPAIR     bilateral  . KNEE ARTHROSCOPY  02/2011   L knee for meniscal tear. [name]  . PERMANENT PACEMAKER INSERTION N/A 07/01/2014   MDT ADDRL1 pacemaker implanted for Mobitz II Dr Rayann Heman  . UVULECTOMY     laser treatment for sleep apnea (NOT UP3)  . VASECTOMY      Social History   Socioeconomic History  . Marital status: Married    Spouse name: Not on file  . Number of children: 4  . Years of education: Not on file  . Highest education level: Not on file  Occupational History  . Occupation: retired  Scientific laboratory technician  . Financial resource strain: Not on file  . Food insecurity:    Worry: Not on file    Inability: Not on file   . Transportation needs:    Medical: Not on file    Non-medical: Not on file  Tobacco Use  . Smoking status: Never Smoker  . Smokeless tobacco: Never Used  Substance and Sexual Activity  . Alcohol use: Yes    Comment: 3-4 drinks per month or less  . Drug use: No  . Sexual activity: Not on file  Lifestyle  . Physical activity:    Days per week: Not on file    Minutes per session: Not on file  . Stress: Not on file  Relationships  . Social connections:    Talks on phone: Not on file    Gets together: Not on file    Attends religious service: Not on file    Active member of club or organization: Not on file    Attends meetings of clubs or organizations: Not on file    Relationship status: Not on file  . Intimate partner violence:    Fear of current or ex partner: Not on file    Emotionally abused: Not on file    Physically abused: Not on file    Forced sexual activity: Not on file  Other Topics Concern  . Not on file  Social History Narrative   Lives with wife, 1 dog.  Daughter, Junie Panning, in [place], [place] (Amy) in [place], son in [place], son in [place].10 grandchildren   Retired school principal   Sold his house and moved to a townhome near [place]. Has a mountain chalet.   [facility] ([facility] even [facility]).    Family History  Problem Relation Age of Onset  . Dementia Mother   . Cancer Mother        ?type, was told it contributed to death, per pt  . Melanoma Mother 57  . Stroke Father 89       cerebral aneurysm  . Diabetes Sister        borderline, obese  . Diabetes Maternal Grandmother   . Heart disease Maternal Grandfather   . Heart attack Maternal Grandfather   . Dementia Paternal Aunt   . Parkinson's disease Maternal Aunt   . Hypertension Neg Hx     Outpatient Encounter Medications as of 12/07/2018  Medication Sig  . aspirin 81 MG tablet Take 81 mg by mouth every other day.  Marland Kitchen atorvastatin (LIPITOR) 40 MG tablet Take 40 mg by mouth daily.   Marland Kitchen b complex vitamins capsule Take 1 capsule by mouth daily.  . carvedilol (  COREG) 6.25 MG tablet TAKE 1 TABLET(6.25 MG) BY MOUTH TWICE DAILY WITH A MEAL  . CINNAMON PO Take 2,000 mg by mouth 3 (three) times daily.   . Coenzyme Q10 (COQ10) 200 MG CAPS Take 1 capsule by mouth 2 (two) times daily.   . fish oil-omega-3 fatty acids 1000 MG capsule Take 1 g by mouth 3 (three) times daily.   Marland Kitchen GABAPENTIN PO Take 1 tablet by mouth 3 (three) times daily. Pt unsure of strenght  . lisinopril (PRINIVIL,ZESTRIL) 20 MG tablet Take 20 mg by mouth daily.  . metFORMIN (GLUCOPHAGE) 500 MG tablet Take 500 mg by mouth daily with breakfast.   . Multiple Vitamins-Minerals (MENS 50+ MULTI VITAMIN/MIN PO) Take 1 tablet by mouth daily.  . TURMERIC PO Take 1 tablet by mouth daily.  . vitamin C (ASCORBIC ACID) 500 MG tablet Take 500 mg by mouth daily.  Marland Kitchen zinc gluconate 50 MG tablet Take 50 mg by mouth daily.  . [DISCONTINUED] Magnesium 500 MG TABS Take 500 mg by mouth daily.   . diphenhydrAMINE (BENADRYL) 50 MG tablet Take 50 mg by mouth at bedtime as needed for itching.   Facility-Administered Encounter Medications as of 12/07/2018  Medication  . influenza  inactive virus vaccine (FLUZONE/FLUARIX) injection 0.5 mL    Allergies  Allergen Reactions  . Horse-Derived Products Anaphylaxis    REACTION: Anaphlactic Reaction.  Can't take tetanus shot  . Pravastatin Other (See Comments)    Joint pain   . Adhesive [Tape] Itching and Rash     ROS: The patient denies anorexia, fever, weight changes, headaches,  vision loss (decreased from cataracts, stable), ear pain, hoarseness, chest pain, palpitations, dizziness, syncope, dyspnea on exertion, cough, swelling, nausea, vomiting, diarrhea, constipation, abdominal pain, melena, hematochezia, indigestion/heartburn, hematuria, incontinence, nocturia, weakened urine stream, dysuria, genital lesions, weakness, tremor, suspicious skin lesions, depression, anxiety, abnormal  bleeding/bruising, or enlarged lymph nodes Tingling in toes and fingers, improved with neurontin, per HPI; more bothersome in his fingers than in his toes. R shoulder pain improved since last injection in April; some soreness with overuse only.    Observations/Objective:  Ht 6' (1.829 m)   Wt 209 lb 7 oz (95 kg)   BMI 28.40 kg/m   Wt Readings from Last 3 Encounters:  09/05/18 214 lb 12.8 oz (97.4 kg)  06/28/18 210 lb (95.3 kg)  01/20/18 215 lb 6.4 oz (97.7 kg)   BP Readings from Last 3 Encounters:  09/05/18 116/60  06/28/18 108/70  01/20/18 110/60   Exam is limited, due to virtual/telephone nature of the visit. He is alert, oriented, in good spirits. Normal speech, mood, affect.  Assessment and Plan:  Medicare annual wellness visit, subsequent  Type 2 diabetes mellitus with diabetic neuropathy, without long-term current use of insulin ([place]) - controlled  Essential hypertension, benign - controlled per pts reported values at other providers offices  Diabetic peripheral neuropathy ([place]) - on treatment from [place], continue  Nonischemic cardiomyopathy ([place]) - stable  Pure hypercholesterolemia - lipids at goal on current regimen per 08/2018 labs  Pulmonary nodules - monitored yearly with [facility] at [facility] (noted incidentally).   Pacemaker - getting regular checks, doing well  Obstructive sleep apnea - reports compliance with nightly use of CPAP. Benefitting from therapy, continue  Thyroid nodule - incidentally noted on last CT (per pt, results n/a to me); scheduled for further eval next month  Discussed PSA screening (risks/benefits, has been very low/stable, fine to skip this year), recommended at least 30 minutes of  aerobic activity at least 5 days/week, weight-bearing exercise 2x/week; proper sunscreen use reviewed; healthy diet and alcohol recommendations (less than or equal to 2 drinks/day) reviewed; regular seatbelt use; changing batteries in smoke detectors, having carbon  monoxide detector. Immunization recommendations discussed--continue to get yearly high dose flu shots in the Fall. Shingrix recommended, risks/side effects reviewed. Allergic to Tetanus. Colonoscopy recommendations reviewed--due now for 2 year follow-up (due to poor prep)  SCHEDULE 1 YEAR cpe/AWV MAIL AVS   Follow Up Instructions:    I discussed the assessment and treatment plan with the patient. The patient was provided an opportunity to ask questions and all were answered. The patient agreed with the plan and demonstrated an understanding of the instructions.   The patient was advised to call back or seek an in-person evaluation if the symptoms worsen or if the condition fails to improve as anticipated.  I provided 50 minutes of non-face-to-face time during this encounter.   Vikki Ports, MD  Medicare Attestation I have personally reviewed: The patient's medical and social history Their use of alcohol, tobacco or illicit drugs Their current medications and supplements The patient's functional ability including ADLs,fall risks, home safety risks, cognitive, and hearing and visual impairment Diet and physical activities Evidence for depression or mood disorders  The patient's weight, height and BMI have been recorded in the chart.  I have made referrals, counseling, and provided education to the patient based on review of the above and I have provided the patient with a written personalized care plan for preventive services.

## 2018-12-07 ENCOUNTER — Other Ambulatory Visit: Payer: Self-pay

## 2018-12-07 ENCOUNTER — Ambulatory Visit (INDEPENDENT_AMBULATORY_CARE_PROVIDER_SITE_OTHER): Payer: Medicare HMO | Admitting: Family Medicine

## 2018-12-07 ENCOUNTER — Encounter: Payer: Self-pay | Admitting: Family Medicine

## 2018-12-07 VITALS — Ht 72.0 in | Wt 209.4 lb

## 2018-12-07 DIAGNOSIS — G4733 Obstructive sleep apnea (adult) (pediatric): Secondary | ICD-10-CM | POA: Diagnosis not present

## 2018-12-07 DIAGNOSIS — I428 Other cardiomyopathies: Secondary | ICD-10-CM | POA: Diagnosis not present

## 2018-12-07 DIAGNOSIS — R918 Other nonspecific abnormal finding of lung field: Secondary | ICD-10-CM

## 2018-12-07 DIAGNOSIS — Z950 Presence of cardiac pacemaker: Secondary | ICD-10-CM | POA: Diagnosis not present

## 2018-12-07 DIAGNOSIS — Z0000 Encounter for general adult medical examination without abnormal findings: Secondary | ICD-10-CM | POA: Diagnosis not present

## 2018-12-07 DIAGNOSIS — I10 Essential (primary) hypertension: Secondary | ICD-10-CM | POA: Diagnosis not present

## 2018-12-07 DIAGNOSIS — E041 Nontoxic single thyroid nodule: Secondary | ICD-10-CM

## 2018-12-07 DIAGNOSIS — E1142 Type 2 diabetes mellitus with diabetic polyneuropathy: Secondary | ICD-10-CM | POA: Diagnosis not present

## 2018-12-07 DIAGNOSIS — E7800 Pure hypercholesterolemia, unspecified: Secondary | ICD-10-CM

## 2018-12-07 DIAGNOSIS — E1140 Type 2 diabetes mellitus with diabetic neuropathy, unspecified: Secondary | ICD-10-CM | POA: Diagnosis not present

## 2018-12-08 NOTE — Progress Notes (Signed)
done

## 2018-12-12 ENCOUNTER — Telehealth: Payer: Self-pay | Admitting: *Deleted

## 2018-12-12 NOTE — Telephone Encounter (Signed)
noted 

## 2018-12-12 NOTE — Telephone Encounter (Signed)
Patient called ans his [place] doctor is Dr.Khaishoonn Hungary.

## 2018-12-13 ENCOUNTER — Ambulatory Visit (INDEPENDENT_AMBULATORY_CARE_PROVIDER_SITE_OTHER): Payer: No Typology Code available for payment source | Admitting: *Deleted

## 2018-12-13 DIAGNOSIS — I441 Atrioventricular block, second degree: Secondary | ICD-10-CM

## 2018-12-14 LAB — CUP PACEART REMOTE DEVICE CHECK
Battery Remaining Longevity: 76 mo
Battery Remaining Percentage: 89 %
Battery Voltage: 2.95 V
Brady Statistic AP VP Percent: 24 %
Brady Statistic AP VS Percent: 1 %
Brady Statistic AS VP Percent: 73 %
Brady Statistic AS VS Percent: 1.1 %
Brady Statistic RA Percent Paced: 24 %
Date Time Interrogation Session: 20200602055015
Implantable Lead Implant Date: 20151220
Implantable Lead Implant Date: 20151220
Implantable Lead Implant Date: 20160819
Implantable Lead Location: 753858
Implantable Lead Location: 753859
Implantable Lead Location: 753860
Implantable Lead Model: 5076
Implantable Lead Model: 5076
Implantable Pulse Generator Implant Date: 20160819
Lead Channel Impedance Value: 390 Ohm
Lead Channel Impedance Value: 410 Ohm
Lead Channel Impedance Value: 440 Ohm
Lead Channel Pacing Threshold Amplitude: 0.5 V
Lead Channel Pacing Threshold Amplitude: 0.625 V
Lead Channel Pacing Threshold Amplitude: 1 V
Lead Channel Pacing Threshold Pulse Width: 0.5 ms
Lead Channel Pacing Threshold Pulse Width: 0.5 ms
Lead Channel Pacing Threshold Pulse Width: 0.8 ms
Lead Channel Sensing Intrinsic Amplitude: 1.8 mV
Lead Channel Sensing Intrinsic Amplitude: 3.6 mV
Lead Channel Setting Pacing Amplitude: 1.5 V
Lead Channel Setting Pacing Amplitude: 2 V
Lead Channel Setting Pacing Amplitude: 2 V
Lead Channel Setting Pacing Pulse Width: 0.5 ms
Lead Channel Setting Pacing Pulse Width: 0.8 ms
Lead Channel Setting Sensing Sensitivity: 0.7 mV
Pulse Gen Model: 3262
Pulse Gen Serial Number: 3099689

## 2018-12-21 NOTE — Progress Notes (Signed)
Remote pacemaker transmission.   

## 2018-12-27 ENCOUNTER — Telehealth: Payer: Self-pay

## 2018-12-27 NOTE — Telephone Encounter (Signed)
Left message for patient to remind of missed remote transmission.  

## 2019-01-09 NOTE — Progress Notes (Signed)
No ICM remote transmission received for 12/26/2018 and next ICM transmission scheduled for 01/18/2019.

## 2019-01-18 ENCOUNTER — Ambulatory Visit (INDEPENDENT_AMBULATORY_CARE_PROVIDER_SITE_OTHER): Payer: No Typology Code available for payment source

## 2019-01-18 DIAGNOSIS — Z950 Presence of cardiac pacemaker: Secondary | ICD-10-CM | POA: Diagnosis not present

## 2019-01-18 DIAGNOSIS — I5042 Chronic combined systolic (congestive) and diastolic (congestive) heart failure: Secondary | ICD-10-CM

## 2019-01-18 NOTE — Progress Notes (Signed)
EPIC Encounter for ICM Monitoring  Patient Name: Alexander Curtis is a 73 y.o. male Date: 01/18/2019 Primary Care Physican: Rita Ohara, MD Primary Cardiologist:Ross Electrophysiologist: Allred Bi-V Pacing: 97% 7/8/2020Weight:207lbs   Transmission reviewed.  Patient asymptomatic. He has 2 thyroid nodules and being referred to [facility] thoracic impedanceabnormal suggesting possible fluid accumulation 01/16/2019.  Prescribed:No diuretic  Recommendations: Encouraged to limit salt intake and fluid intake.   Follow-up plan: ICM clinic phone appointment on7/17/2020 to recheck fluid levels.   Copy of ICM check sent to Dr.Allred.   3 month ICM trend: 01/18/2019    1 Year ICM trend:       [name], RN 01/18/2019 8:50 AM

## 2019-01-23 ENCOUNTER — Telehealth: Payer: Self-pay | Admitting: Family Medicine

## 2019-01-23 NOTE — Telephone Encounter (Signed)
I'm happy to refer, but we need the documentation to send with the referral.  Okay to refer to [place] once we get records

## 2019-01-23 NOTE — Telephone Encounter (Signed)
  Went to [place] on 7/6  [facility] nodule [facility]  found second lump  And referred to endo. He wants to go outside [place] for this and wants you to referl to endocrinologist

## 2019-01-23 NOTE — Telephone Encounter (Signed)
Spoke with patient and he will get the notes and get to me and I will do the referral.

## 2019-01-23 NOTE — Telephone Encounter (Signed)
Left message on patient's voicemail.

## 2019-01-27 ENCOUNTER — Ambulatory Visit (INDEPENDENT_AMBULATORY_CARE_PROVIDER_SITE_OTHER): Payer: Non-veteran care

## 2019-01-27 DIAGNOSIS — I5042 Chronic combined systolic (congestive) and diastolic (congestive) heart failure: Secondary | ICD-10-CM

## 2019-01-27 DIAGNOSIS — Z950 Presence of cardiac pacemaker: Secondary | ICD-10-CM

## 2019-01-27 NOTE — Progress Notes (Signed)
EPIC Encounter for ICM Monitoring  Patient Name: Alexander Curtis is a 73 y.o. male Date: 01/27/2019 Primary Care Physican: Rita Ohara, MD Primary Cardiologist:Ross Electrophysiologist: Allred Bi-V Pacing: 97% 7/8/2020Weight:207lbs   Transmission reviewed.  Patient asymptomatic.He has 2 thyroid nodules and being referred to [facility] thoracic impedance returned to normal since last remote transmission.  Prescribed:No diuretic  Recommendations: Encouraged to limit salt intake and fluid intake.   Follow-up plan: ICM clinic phone appointment on8/04/2019.  Copy of ICM check sent to Dr.Allred.   3 month ICM trend: 01/27/2019    1 Year ICM trend:       [name], RN 01/27/2019 4:28 PM

## 2019-02-20 ENCOUNTER — Ambulatory Visit (INDEPENDENT_AMBULATORY_CARE_PROVIDER_SITE_OTHER): Payer: No Typology Code available for payment source

## 2019-02-20 DIAGNOSIS — I5042 Chronic combined systolic (congestive) and diastolic (congestive) heart failure: Secondary | ICD-10-CM

## 2019-02-20 DIAGNOSIS — Z950 Presence of cardiac pacemaker: Secondary | ICD-10-CM | POA: Diagnosis not present

## 2019-02-22 NOTE — Progress Notes (Signed)
EPIC Encounter for ICM Monitoring  Patient Name: Alexander Curtis is a 73 y.o. male Date: 02/22/2019 Primary Care Physican: Rita Ohara, MD Primary Cardiologist:Ross Electrophysiologist: Allred Bi-V Pacing: 97% 8/12/2020Weight:210lbs   Transmission reviewed. Patient asymptomatic but has gained a couple of pounds.He is following up at [facility] on 2 thyroid nodules. [place] and not staying on track eating a low salt diet.   [place] thoracic impedance normal but was suggestive of possible fluid accumulation from 7/28 - 8/2 and 8/4 to 8/10  Prescribed:No diuretic  Recommendations: Encouraged to limit salt intake and fluid intake.   Follow-up plan: ICM clinic phone appointment on9/14/2020.  Copy of ICM check sent to Dr.Allred.  3 month ICM trend: 02/20/2019    1 Year ICM trend:       [name], RN 02/22/2019 10:08 AM

## 2019-03-14 ENCOUNTER — Ambulatory Visit (INDEPENDENT_AMBULATORY_CARE_PROVIDER_SITE_OTHER): Payer: Medicare HMO | Admitting: *Deleted

## 2019-03-14 DIAGNOSIS — I441 Atrioventricular block, second degree: Secondary | ICD-10-CM

## 2019-03-14 DIAGNOSIS — I428 Other cardiomyopathies: Secondary | ICD-10-CM

## 2019-03-14 LAB — CUP PACEART REMOTE DEVICE CHECK
Battery Remaining Longevity: 77 mo
Battery Remaining Percentage: 89 %
Battery Voltage: 2.95 V
Brady Statistic AP VP Percent: 25 %
Brady Statistic AP VS Percent: 1 %
Brady Statistic AS VP Percent: 73 %
Brady Statistic AS VS Percent: 1.2 %
Brady Statistic RA Percent Paced: 25 %
Date Time Interrogation Session: 20200901100916
Implantable Lead Implant Date: 20151220
Implantable Lead Implant Date: 20151220
Implantable Lead Implant Date: 20160819
Implantable Lead Location: 753858
Implantable Lead Location: 753859
Implantable Lead Location: 753860
Implantable Lead Model: 5076
Implantable Lead Model: 5076
Implantable Pulse Generator Implant Date: 20160819
Lead Channel Impedance Value: 390 Ohm
Lead Channel Impedance Value: 410 Ohm
Lead Channel Impedance Value: 430 Ohm
Lead Channel Pacing Threshold Amplitude: 0.5 V
Lead Channel Pacing Threshold Amplitude: 0.75 V
Lead Channel Pacing Threshold Amplitude: 1 V
Lead Channel Pacing Threshold Pulse Width: 0.5 ms
Lead Channel Pacing Threshold Pulse Width: 0.5 ms
Lead Channel Pacing Threshold Pulse Width: 0.8 ms
Lead Channel Sensing Intrinsic Amplitude: 1.5 mV
Lead Channel Sensing Intrinsic Amplitude: 3.9 mV
Lead Channel Setting Pacing Amplitude: 1.5 V
Lead Channel Setting Pacing Amplitude: 2 V
Lead Channel Setting Pacing Amplitude: 2 V
Lead Channel Setting Pacing Pulse Width: 0.5 ms
Lead Channel Setting Pacing Pulse Width: 0.8 ms
Lead Channel Setting Sensing Sensitivity: 0.7 mV
Pulse Gen Model: 3262
Pulse Gen Serial Number: 3099689

## 2019-03-28 ENCOUNTER — Telehealth: Payer: Self-pay

## 2019-03-28 DIAGNOSIS — K649 Unspecified hemorrhoids: Secondary | ICD-10-CM | POA: Diagnosis not present

## 2019-03-28 DIAGNOSIS — K625 Hemorrhage of anus and rectum: Secondary | ICD-10-CM | POA: Diagnosis not present

## 2019-03-28 NOTE — Telephone Encounter (Signed)
Left message for patient to remind of missed remote transmission.  

## 2019-03-29 NOTE — Progress Notes (Signed)
No ICM remote transmission received for 03/27/2019 and next ICM transmission scheduled for 04/17/2019.

## 2019-03-30 NOTE — Progress Notes (Signed)
Remote pacemaker transmission.   

## 2019-04-05 ENCOUNTER — Other Ambulatory Visit (INDEPENDENT_AMBULATORY_CARE_PROVIDER_SITE_OTHER): Payer: Medicare HMO

## 2019-04-05 ENCOUNTER — Other Ambulatory Visit: Payer: Self-pay

## 2019-04-05 DIAGNOSIS — Z23 Encounter for immunization: Secondary | ICD-10-CM

## 2019-04-17 ENCOUNTER — Ambulatory Visit (INDEPENDENT_AMBULATORY_CARE_PROVIDER_SITE_OTHER): Payer: No Typology Code available for payment source

## 2019-04-17 DIAGNOSIS — Z950 Presence of cardiac pacemaker: Secondary | ICD-10-CM

## 2019-04-17 DIAGNOSIS — I5042 Chronic combined systolic (congestive) and diastolic (congestive) heart failure: Secondary | ICD-10-CM | POA: Diagnosis not present

## 2019-04-18 LAB — CUP PACEART REMOTE DEVICE CHECK
Battery Remaining Longevity: 76 mo
Battery Remaining Percentage: 89 %
Battery Voltage: 2.95 V
Brady Statistic AP VP Percent: 25 %
Brady Statistic AP VS Percent: 1 %
Brady Statistic AS VP Percent: 72 %
Brady Statistic AS VS Percent: 1.2 %
Brady Statistic RA Percent Paced: 25 %
Date Time Interrogation Session: 20201005060017
Implantable Lead Implant Date: 20151220
Implantable Lead Implant Date: 20151220
Implantable Lead Implant Date: 20160819
Implantable Lead Location: 753858
Implantable Lead Location: 753859
Implantable Lead Location: 753860
Implantable Lead Model: 5076
Implantable Lead Model: 5076
Implantable Pulse Generator Implant Date: 20160819
Lead Channel Impedance Value: 380 Ohm
Lead Channel Impedance Value: 390 Ohm
Lead Channel Impedance Value: 410 Ohm
Lead Channel Pacing Threshold Amplitude: 0.5 V
Lead Channel Pacing Threshold Amplitude: 0.625 V
Lead Channel Pacing Threshold Amplitude: 1 V
Lead Channel Pacing Threshold Pulse Width: 0.5 ms
Lead Channel Pacing Threshold Pulse Width: 0.5 ms
Lead Channel Pacing Threshold Pulse Width: 0.8 ms
Lead Channel Sensing Intrinsic Amplitude: 3 mV
Lead Channel Sensing Intrinsic Amplitude: 3.4 mV
Lead Channel Setting Pacing Amplitude: 1.5 V
Lead Channel Setting Pacing Amplitude: 2 V
Lead Channel Setting Pacing Amplitude: 2 V
Lead Channel Setting Pacing Pulse Width: 0.5 ms
Lead Channel Setting Pacing Pulse Width: 0.8 ms
Lead Channel Setting Sensing Sensitivity: 0.7 mV
Pulse Gen Model: 3262
Pulse Gen Serial Number: 3099689

## 2019-04-19 NOTE — Progress Notes (Signed)
EPIC Encounter for ICM Monitoring  Patient Name: Alexander Curtis is a 73 y.o. male Date: 04/19/2019 Primary Care Physican: Rita Ohara, MD Primary Cardiologist:Ross Electrophysiologist: Allred Bi-V Pacing: 97% 10/7/2020Weight:205lbs   Spoke with patient.  He is doing well and has been working on the [employer].  [facility] thoracic impedancenormal but was suggestive of possible fluid accumulation from 9/23-9/27.  Prescribed:No diuretic  Recommendations: No changes and encouraged to call if experiencing any fluid symptoms.  Follow-up plan: ICM clinic phone appointment on 05/22/2019.   91 day device clinic remote transmission 06/13/2019.     Copy of ICM check sent to Dr. Rayann Heman.   3 month ICM trend: 04/17/2019    1 Year ICM trend:       [name], RN 04/19/2019 12:56 PM

## 2019-05-22 ENCOUNTER — Ambulatory Visit (INDEPENDENT_AMBULATORY_CARE_PROVIDER_SITE_OTHER): Payer: No Typology Code available for payment source

## 2019-05-22 DIAGNOSIS — Z950 Presence of cardiac pacemaker: Secondary | ICD-10-CM | POA: Diagnosis not present

## 2019-05-22 DIAGNOSIS — I5042 Chronic combined systolic (congestive) and diastolic (congestive) heart failure: Secondary | ICD-10-CM | POA: Diagnosis not present

## 2019-05-26 NOTE — Progress Notes (Signed)
EPIC Encounter for ICM Monitoring  Patient Name: Alexander Curtis is a 73 y.o. male Date: 05/26/2019 Primary Care Physican: Rita Ohara, MD Primary Cardiologist:Ross Electrophysiologist: Allred Bi-V Pacing: 97% 10/7/2020Weight:205lbs   Spoke with patient.  He said he is doing fine.   [facility] thoracic impedancenormal.  Prescribed:No diuretic  Recommendations: No changes and encouraged to call if experiencing any fluid symptoms.  Follow-up plan: ICM clinic phone appointment on 07/31/2019.   91 day device clinic remote transmission 06/13/2019.  Office appt 06/26/2019 with Dillon Bjork, PA.    Copy of ICM check sent to Dr. Rayann Heman.   3 month ICM trend: 05/24/2019    1 Year ICM trend:       [name], RN 05/26/2019 9:31 AM

## 2019-06-13 ENCOUNTER — Ambulatory Visit (INDEPENDENT_AMBULATORY_CARE_PROVIDER_SITE_OTHER): Payer: No Typology Code available for payment source | Admitting: *Deleted

## 2019-06-13 DIAGNOSIS — R001 Bradycardia, unspecified: Secondary | ICD-10-CM

## 2019-06-13 LAB — CUP PACEART REMOTE DEVICE CHECK
Battery Remaining Longevity: 68 mo
Battery Remaining Percentage: 80 %
Battery Voltage: 2.93 V
Brady Statistic AP VP Percent: 25 %
Brady Statistic AP VS Percent: 1 %
Brady Statistic AS VP Percent: 72 %
Brady Statistic AS VS Percent: 1.2 %
Brady Statistic RA Percent Paced: 25 %
Date Time Interrogation Session: 20201201020015
Implantable Lead Implant Date: 20151220
Implantable Lead Implant Date: 20151220
Implantable Lead Implant Date: 20160819
Implantable Lead Location: 753858
Implantable Lead Location: 753859
Implantable Lead Location: 753860
Implantable Lead Model: 5076
Implantable Lead Model: 5076
Implantable Pulse Generator Implant Date: 20160819
Lead Channel Impedance Value: 380 Ohm
Lead Channel Impedance Value: 410 Ohm
Lead Channel Impedance Value: 410 Ohm
Lead Channel Pacing Threshold Amplitude: 0.5 V
Lead Channel Pacing Threshold Amplitude: 0.75 V
Lead Channel Pacing Threshold Amplitude: 1.125 V
Lead Channel Pacing Threshold Pulse Width: 0.5 ms
Lead Channel Pacing Threshold Pulse Width: 0.5 ms
Lead Channel Pacing Threshold Pulse Width: 0.8 ms
Lead Channel Sensing Intrinsic Amplitude: 1.8 mV
Lead Channel Sensing Intrinsic Amplitude: 3.1 mV
Lead Channel Setting Pacing Amplitude: 1.5 V
Lead Channel Setting Pacing Amplitude: 2 V
Lead Channel Setting Pacing Amplitude: 2.125
Lead Channel Setting Pacing Pulse Width: 0.5 ms
Lead Channel Setting Pacing Pulse Width: 0.8 ms
Lead Channel Setting Sensing Sensitivity: 0.7 mV
Pulse Gen Model: 3262
Pulse Gen Serial Number: 3099689

## 2019-06-22 ENCOUNTER — Telehealth: Payer: Self-pay | Admitting: Family Medicine

## 2019-06-22 NOTE — Telephone Encounter (Signed)
Patient informed, he will stop by (call from parking lot) and pick up CD.

## 2019-06-22 NOTE — Telephone Encounter (Signed)
Patient dropped off copy of chest CT report and a CT (which I cannot view--no CD port in my computer).  He left a note stating that he was told the earliest appointment for pulmonary was January 8th.  There were no notes from the [place] with the CT.    CT impression stated there was a 6-21mm ground glass nodule slowly increasing in density in medial RLL, now with a possible punctate solid component anteriorly.  Recommend follow-up noncontrast chest CT in 3 months. (LLL opacities that were present 05/17/18 resolved, and other scattered, small discrete pulmonary nodules were unchanged from 2017).  I don't know if [facility], or if they called on their own, not sure which pulmonary group they are referring to. Not sure if they don't feel comfortable with the recommendation to recheck in 3 mos, and sought out pulm appt themselves, or if it was recommended/referred. The CT was performed 06/07/2019.  I personally do not have a problem with waiting until 1/8 to see pulm.  Please check with patient (phone numbers for pt and wife are on the post-it note on the report in the red folder), and also let them know they can keep the CD (and supply to pulm), as I cannot view this.

## 2019-06-23 DIAGNOSIS — Z1159 Encounter for screening for other viral diseases: Secondary | ICD-10-CM | POA: Diagnosis not present

## 2019-06-24 NOTE — Progress Notes (Addendum)
Cardiology Office Note Date:  06/24/2019  [name]:  Alexander Curtis, Alexander Curtis [birth year], MRN [id number] PCP:  Rita Ohara, MD  Cardiologist:  Dr. Harrington Challenger Electrophysiologist: Dr. Rayann Heman    Chief Complaint:   annual EP visit  History of Present Illness: Alexander Curtis is a 73 y.o. male with history of DM, HTN, OSA w/CPAP, LBBB, NICM, chronic CHF (mixed), advanced heart block w/CRT-P.  He comes in today to be seen for Dr. Rayann Heman, last seen by him in Nov 2018.  At that time, doing well, noted only short and rare disorganized atrial arrhythmia, no sustained AF and urged CPAP compliance.  No changes to his tx or programming were made at this visit.  I saw him dec 2019, he was feeling very well, no CP, palpitations or SOB.  He reported very good exertional capacity, but realized his age as well.  No dizziness, near syncope or syncope.  He denied symptoms of PND or orthopnea.  No changes were made to his medicines or programming  He follows with ICM clinic.  He saw Dr. Harrington Challenger Feb this year, he was doing well, no changes were made.  He reports feeling well.  Denies any kind of exertional intolerances.  Mentions he tends to work outside much more when at his property in the [place], this summer resurfaced his driveway without exertional/cardiac symptoms.  He also push mows (propelled mower) his yard.  He describes himself as very active.   He mentions in May he had been on a roller coaster, felt fine, got off and walked to his car, and when he reached out to open the door, he got a central CP that caught his breath.  It took a few minutes to resolve.  He has not had it again despite physically demanding activities No dizzy spells, no near syncope or syncope. No palpitations  He [facility] He tells me he had pulmonary nodules  And "ground glass something or other" and is seeing a pulmonologist Jan 8. He is due for his colonoscopy  (screening),  scheduled for later this week   Device information: SJM CRT-P, initial device implanted 07/01/14 >> LV lead/CRT upgrade 03/01/15  Past Medical History:  Diagnosis Date  . Arthritis of shoulder    right (CT done 11/2018)  . Atrial enlargement, left   . Diabetes mellitus, type 2 ([place])   . HLD (hyperlipidemia)   . Hypertension 05/2013  . LBBB (left bundle branch block)   . Melanoma ([place]) 1968   R arm  . Mitral regurgitation   . Mobitz type 2 second degree heart block    a. s/p Medtronic Adapta L model ADDRL 1 (serial number [id number]) pacemaker. 07/02/14 b.  upgrade to STJ CRTP 02/2015  . Non-ischemic cardiomyopathy ([place])    a. felt to be 2/2 RV pacing  . Obstructive sleep apnea    compliant with CPAP  . Pulmonary nodules    noted on CT of shoulder 03/2016, up to 83mm in size on CT chest 04/2016. repeat 1 year due to h/o melanoma  . Syncope 2011   a. 2011 -negative workup except LBBB. thought to be vasovagal  . Tricuspid regurgitation     Past Surgical History:  Procedure Laterality Date  . EP IMPLANTABLE DEVICE N/A 03/01/2015   STJ CRTP upgrade by Dr Rayann Heman  . FINGER TENDON REPAIR     R 2nd and 3rd finger  . INGUINAL HERNIA  REPAIR     bilateral  . KNEE ARTHROSCOPY  02/2011   L knee for meniscal tear. [name]  . PERMANENT PACEMAKER INSERTION N/A 07/01/2014   MDT ADDRL1 pacemaker implanted for Mobitz II Dr Rayann Heman  . UVULECTOMY     laser treatment for sleep apnea (NOT UP3)  . VASECTOMY      Current Outpatient Medications  Medication Sig Dispense Refill  . aspirin 81 MG tablet Take 81 mg by mouth every other day.    Marland Kitchen atorvastatin (LIPITOR) 40 MG tablet Take 40 mg by mouth daily.    Marland Kitchen b complex vitamins capsule Take 1 capsule by mouth daily.    . carvedilol (COREG) 6.25 MG tablet TAKE 1 TABLET(6.25 MG) BY MOUTH TWICE DAILY WITH A MEAL 180 tablet 1  . CINNAMON PO Take 2,000 mg by mouth 3 (three) times daily.     . Coenzyme Q10 (COQ10) 200 MG CAPS Take 1 capsule by  mouth 2 (two) times daily.     . diphenhydrAMINE (BENADRYL) 50 MG tablet Take 50 mg by mouth at bedtime as needed for itching.    . fish oil-omega-3 fatty acids 1000 MG capsule Take 1 g by mouth 3 (three) times daily.     Marland Kitchen GABAPENTIN PO Take 1 tablet by mouth 3 (three) times daily. Pt unsure of strenght    . lisinopril (PRINIVIL,ZESTRIL) 20 MG tablet Take 20 mg by mouth daily.    . metFORMIN (GLUCOPHAGE) 500 MG tablet Take 500 mg by mouth daily with breakfast.     . Multiple Vitamins-Minerals (MENS 50+ MULTI VITAMIN/MIN PO) Take 1 tablet by mouth daily.    . TURMERIC PO Take 1 tablet by mouth daily.    . vitamin C (ASCORBIC ACID) 500 MG tablet Take 500 mg by mouth daily.    Marland Kitchen zinc gluconate 50 MG tablet Take 50 mg by mouth daily.     No current facility-administered medications for this visit.   Facility-Administered Medications Ordered in Other Visits  Medication Dose Route Frequency Provider Last Rate Last Admin  . influenza  inactive virus vaccine (FLUZONE/FLUARIX) injection 0.5 mL  0.5 mL Intramuscular Once Rita Ohara, MD        Allergies:   Horse-derived products, Pravastatin, and Adhesive [tape]   Social History:  The patient  reports that he has never smoked. He has never used smokeless tobacco. He reports current alcohol use. He reports that he does not use drugs.   Family History:  The patient's family history includes Cancer in his mother; Dementia in his mother and paternal aunt; Diabetes in his maternal grandmother and sister; Heart attack in his maternal grandfather; Heart disease in his maternal grandfather; Melanoma (age of onset: 31) in his mother; Parkinson's disease in his maternal aunt; Stroke (age of onset: 48) in his father; Stroke (age of onset: 6) in his sister.  ROS:  Please see the history of present illness.    All other systems are reviewed and otherwise negative.   PHYSICAL EXAM:  VS:  There were no vitals taken for this visit. BMI: There is no height or  weight on file to calculate BMI. Well nourished, well developed, in no acute distress  HEENT: normocephalic, atraumatic  Neck: no JVD, carotid bruits or masses Cardiac:  RRR; no significant murmurs, no rubs, or gallops Lungs: CTA b/l, no wheezing, rhonchi or rales  Abd: soft, nontender MS: no deformity or atrophy Ext: no edema  Skin: warm and dry, no rash Neuro:  No  gross deficits appreciated Psych: euthymic mood, full affect  PPM site is stable, no tethering or discomfort   EKG:  Done today and reviewed by myself SR, V paced, no significant changes  PPM interrogation done today and reviewed by myself:  Battery and lead measurements are good 3 AMS episodes, ONE is available for review, this AFib, duration 9hrs:2min in Aug 2020 HVR episodes look 1:1 CorVue just under/at threshold 97% BP    08/16/15: TTE Study Conclusions - Left ventricle: The cavity size was mildly dilated. Wall   thickness was normal. Systolic function was mildly reduced. The   estimated ejection fraction was in the range of 45% to 50%.   Diffuse hypokinesis. Doppler parameters are consistent with   abnormal left ventricular relaxation (grade 1 diastolic   dysfunction). - Aortic valve: There was trivial regurgitation. - Mitral valve: There was mild regurgitation directed centrally. - Left atrium: The atrium was mildly dilated. - Pulmonary arteries: Systolic pressure was mildly increased. PA   peak pressure: 32 mm Hg (S). Impressions: - When compared to prior, there is mild improvement in EF (from   40%)  10/29/14 LVEF 35-40% 06/30/14: LVEF 55-60%  Recent Labs: No results found for requested labs within last 8760 hours.  No results found for requested labs within last 8760 hours.   CrCl cannot be calculated (Patient's most recent lab result is older than the maximum 21 days allowed.).   Wt Readings from Last 3 Encounters:  12/07/18 209 lb 7 oz (95 kg)  09/05/18 214 lb 12.8 oz (97.4 kg)  06/28/18  210 lb (95.3 kg)     Other studies reviewed: Additional studies/records reviewed today include: summarized above  ASSESSMENT AND PLAN:  1. H/o advanced heart block, PPM >> CRT-P      Intact function, no programming changes made      97% BiVe pacing  2. NICM 3. Chronic CHF (systolic), NYHA I-II     No symptoms or exam findings to suggest vlume OL     CorVue at/just under threshold     On BB, ACE       He has been recently found with pulmonary nodules, ?ground glass pending pulmonary evaluation He is asked to request [facility] share with [place] his consult/findings    4. HTN     No changes today     He reports typically better   5. Paroxysmal Afib     He has had historically rare disorganized atrial arrhythmia, no sustained AF     In Aug he had one episode of nearly 10 hours of AFib     Burden remains low <1%     His CHA2DS2Vasc score is 4  Given duration of his episode I would be inclined to start anticoagulation I discussed with him AFib, his risk score and embolic stroke risk He is agreeable to start [facility] though would like Drs Harrington Challenger and Allred to weigh in and not until after his colonoscopy later this week  I will send my note to both and ask their input.  6. CP     Suspect musculoskeletal after being on a roller coaster, and occurred when reaching     None otherwise with ver labor intensive activities.     Instructed to notify if recurrent       Disposition: I will communicate with the patient once Dr. Rayann Heman and Harrington Challenger get back to me.  ADDEND: 07/18/2019: late entry, I spoke briefly with Dr. Harrington Challenger a couple days after the patient's  visit, she had gotten the message, wanted to review the chart, perhaps wait his pulmonary evaluation and get back to me.  I will follow up this week.  Current medicines are reviewed at length with the patient today.  The patient did not have any concerns regarding medicines.  Venetia Night, PA-C 06/24/2019 8:11 AM     [facility] [address] [place] [address] [phone] (office)  [phone] (fax)

## 2019-06-26 ENCOUNTER — Ambulatory Visit (INDEPENDENT_AMBULATORY_CARE_PROVIDER_SITE_OTHER): Payer: No Typology Code available for payment source | Admitting: Physician Assistant

## 2019-06-26 ENCOUNTER — Other Ambulatory Visit: Payer: Self-pay

## 2019-06-26 VITALS — BP 140/72 | HR 64 | Ht 72.0 in | Wt 222.0 lb

## 2019-06-26 DIAGNOSIS — I441 Atrioventricular block, second degree: Secondary | ICD-10-CM | POA: Diagnosis not present

## 2019-06-26 DIAGNOSIS — Z950 Presence of cardiac pacemaker: Secondary | ICD-10-CM

## 2019-06-26 DIAGNOSIS — I480 Paroxysmal atrial fibrillation: Secondary | ICD-10-CM

## 2019-06-26 DIAGNOSIS — I10 Essential (primary) hypertension: Secondary | ICD-10-CM | POA: Diagnosis not present

## 2019-06-26 DIAGNOSIS — I428 Other cardiomyopathies: Secondary | ICD-10-CM | POA: Diagnosis not present

## 2019-06-26 NOTE — Patient Instructions (Signed)
Medication Instructions:   Your physician recommends that you continue on your current medications as directed. Please refer to the Current Medication list given to you today.   *If you need a refill on your cardiac medications before your next appointment, please call your pharmacy*  Lab Work: NONE ORDERED  TODAY  * If you have labs (blood work) drawn today and your tests are completely normal, you will receive your results only by: Marland Kitchen MyChart Message (if you have MyChart) OR . A paper copy in the mail If you have any lab test that is abnormal or we need to change your treatment, we will call you to review the results.  Testing/Procedures: NONE ORDERED  TODAY   Follow-Up: At [facility], you and your health needs are our priority.  As part of our continuing mission to provide you with exceptional heart care, we have created designated Provider Care Teams.  These Care Teams include your primary Cardiologist (physician) and Advanced Practice Providers (APPs -  Physician Assistants and Nurse Practitioners) who all work together to provide you with the care you need, when you need it.  Your next appointment:   1 month(s)  The format for your next appointment:   In Person  Provider:  Tommye Standard, PA-C   Other Instructions

## 2019-06-28 ENCOUNTER — Encounter: Payer: No Typology Code available for payment source | Admitting: Physician Assistant

## 2019-06-28 ENCOUNTER — Telehealth: Payer: Self-pay | Admitting: *Deleted

## 2019-06-28 DIAGNOSIS — D123 Benign neoplasm of transverse colon: Secondary | ICD-10-CM | POA: Diagnosis not present

## 2019-06-28 DIAGNOSIS — K635 Polyp of colon: Secondary | ICD-10-CM | POA: Diagnosis not present

## 2019-06-28 DIAGNOSIS — K5730 Diverticulosis of large intestine without perforation or abscess without bleeding: Secondary | ICD-10-CM | POA: Diagnosis not present

## 2019-06-28 DIAGNOSIS — K648 Other hemorrhoids: Secondary | ICD-10-CM | POA: Diagnosis not present

## 2019-06-28 DIAGNOSIS — K5520 Angiodysplasia of colon without hemorrhage: Secondary | ICD-10-CM | POA: Diagnosis not present

## 2019-06-28 DIAGNOSIS — K625 Hemorrhage of anus and rectum: Secondary | ICD-10-CM | POA: Diagnosis not present

## 2019-06-28 DIAGNOSIS — K644 Residual hemorrhoidal skin tags: Secondary | ICD-10-CM | POA: Diagnosis not present

## 2019-06-28 DIAGNOSIS — K5289 Other specified noninfective gastroenteritis and colitis: Secondary | ICD-10-CM | POA: Diagnosis not present

## 2019-06-28 LAB — HM COLONOSCOPY

## 2019-06-28 NOTE — Telephone Encounter (Signed)
Yes, okay to refer to pulm.  Have him bring his CD with him to visit.  Hopefully the report was scanned.

## 2019-06-28 NOTE — Telephone Encounter (Signed)
Patient called back in reference to CT that he sent last week. They were really looking for a referral to pulm for another opinion. Would you be able to refer? He will still see [place] doctor but they would like to see both.

## 2019-06-29 ENCOUNTER — Other Ambulatory Visit: Payer: Self-pay | Admitting: *Deleted

## 2019-06-29 DIAGNOSIS — R918 Other nonspecific abnormal finding of lung field: Secondary | ICD-10-CM

## 2019-06-30 DIAGNOSIS — K635 Polyp of colon: Secondary | ICD-10-CM | POA: Diagnosis not present

## 2019-06-30 DIAGNOSIS — D123 Benign neoplasm of transverse colon: Secondary | ICD-10-CM | POA: Diagnosis not present

## 2019-06-30 DIAGNOSIS — K5289 Other specified noninfective gastroenteritis and colitis: Secondary | ICD-10-CM | POA: Diagnosis not present

## 2019-07-08 NOTE — Progress Notes (Signed)
PPM remote 

## 2019-07-11 ENCOUNTER — Encounter: Payer: Self-pay | Admitting: *Deleted

## 2019-07-12 ENCOUNTER — Encounter: Payer: Self-pay | Admitting: Family Medicine

## 2019-07-19 NOTE — Progress Notes (Signed)
Cardiology Office Note Date:  07/19/2019  [name]:  Alexander Curtis, Alexander Curtis 03/15/1946, MRN [id number] PCP:  Rita Ohara, MD  Cardiologist:  Dr. Harrington Challenger Electrophysiologist: Dr. Rayann Heman    Chief Complaint:   f/u visit  History of Present Illness: Alexander Curtis is a 74 y.o. male with history of DM, HTN, OSA w/CPAP, LBBB, NICM, chronic CHF (mixed), advanced heart block w/CRT-P.  He comes in today to be seen for Dr. Rayann Heman, last seen by him in Nov 2018.  At that time, doing well, noted only short and rare disorganized atrial arrhythmia, no sustained AF and urged CPAP compliance.  No changes to his tx or programming were made at this visit.  I saw him dec 2019, he was feeling very well, no CP, palpitations or SOB.  He reported very good exertional capacity, but realized his age as well.  No dizziness, near syncope or syncope.  He denied symptoms of PND or orthopnea.  No changes were made to his medicines or programming  He follows with ICM clinic.  He saw Dr. Harrington Challenger Feb this year, he was doing well, no changes were made.  I saw him Dec 2020, he reporte feeling well.  Denies any kind of exertional intolerances.  Mentioned he tends to work outside much more when at his property in the [place], in the summer had resurfaced his driveway without exertional/cardiac symptoms.  He also push mows (propelled mower) his yard.  He described himself as very active.   He mentioned in May he had been on a roller coaster, felt fine, got off and walked to his car, and when he reached out to open the door, he got a central CP that caught his breath.  It took a few minutes to resolve.  He had not had it again despite physically demanding activities No dizzy spells, no near syncope or syncope. No palpitations He reported his PMD and [facility] including lipid management He had been found with pulmonary nodules  And "ground glass something or other" and was pending a pulmonary evaluation with the [facility] 07/20/2018 He  was scheduled for his colonoscopy  (screening), later in the week He was found to have AFib via his PPM interrogation of nearly 10 hours, I was inclined to start a/c with CHA2DS2Vasc score of 4, though we would wait for his colonoscopy and he was agreeable but wanted Dr. Harrington Challenger (and/or Dr. Rayann Heman) to weigh in. I sent note to both, had an opportunity to discuss briefly with Dr. Harrington Challenger, she planned to review the chart and comment.    He feels well, no recurrent CP, remains very active physically, has been working on his property in the [place] without CP, palpitations or SOB   Device information: SJM CRT-P, initial device implanted 07/01/14 >> LV lead/CRT upgrade 03/01/15  Past Medical History:  Diagnosis Date  . Arthritis of shoulder    right (CT done 11/2018)  . Atrial enlargement, left   . Bradycardia 06/30/2014  . Diabetes mellitus, type 2 ([place])   . Diabetic peripheral neuropathy ([place]) 01/24/2017   [facility] and [facility] gabapentin  . HLD (hyperlipidemia)   . Hypertension 05/2013  . LBBB (left bundle branch block)   . Melanoma ([place]) 1968   R arm  . Mitral regurgitation   . Mobitz type 2 second degree heart block    a. s/p Medtronic Adapta L model ADDRL 1 (serial number [id number]) pacemaker. 07/02/14 b.  upgrade to STJ  CRTP 02/2015  . Non-ischemic cardiomyopathy ([place])    a. felt to be 2/2 RV pacing  . Obstructive sleep apnea    compliant with CPAP  . Pacemaker    a. Medtronic Adapta L model ADDRL 1 (serial number [id number]) pacemaker.   . Pulmonary nodules    noted on CT of shoulder 03/2016, up to 35mm in size on CT chest 04/2016. repeat 1 year due to h/o melanoma  . Syncope 2011   a. 2011 -negative workup except LBBB. thought to be vasovagal  . Tricuspid regurgitation     Past Surgical History:  Procedure Laterality Date  . EP IMPLANTABLE DEVICE N/A 03/01/2015   STJ CRTP upgrade by Dr Rayann Heman  . FINGER TENDON REPAIR     R 2nd and 3rd finger  . INGUINAL HERNIA  REPAIR     bilateral  . KNEE ARTHROSCOPY  02/2011   L knee for meniscal tear. [name]  . PERMANENT PACEMAKER INSERTION N/A 07/01/2014   MDT ADDRL1 pacemaker implanted for Mobitz II Dr Rayann Heman  . UVULECTOMY     laser treatment for sleep apnea (NOT UP3)  . VASECTOMY      Current Outpatient Medications  Medication Sig Dispense Refill  . aspirin 81 MG tablet Take 81 mg by mouth every other day.    Marland Kitchen atorvastatin (LIPITOR) 40 MG tablet Take 40 mg by mouth daily.    Marland Kitchen b complex vitamins capsule Take 1 capsule by mouth daily.    . carvedilol (COREG) 6.25 MG tablet TAKE 1 TABLET(6.25 MG) BY MOUTH TWICE DAILY WITH A MEAL 180 tablet 1  . CINNAMON PO Take 2,000 mg by mouth 3 (three) times daily.     . Coenzyme Q10 (COQ10) 200 MG CAPS Take 1 capsule by mouth 2 (two) times daily.     . diphenhydrAMINE (BENADRYL) 50 MG tablet Take 50 mg by mouth at bedtime as needed for itching.    . fish oil-omega-3 fatty acids 1000 MG capsule Take 1 g by mouth 3 (three) times daily.     Marland Kitchen GABAPENTIN PO Take 1 tablet by mouth 3 (three) times daily. Pt unsure of strenght    . lisinopril (PRINIVIL,ZESTRIL) 20 MG tablet Take 20 mg by mouth daily.    . Magnesium 250 MG TABS Take 250 mg by mouth daily.    . metFORMIN (GLUCOPHAGE) 500 MG tablet Take 500 mg by mouth daily with breakfast.     . Multiple Vitamins-Minerals (MENS 50+ MULTI VITAMIN/MIN PO) Take 1 tablet by mouth daily.    . TURMERIC PO Take 1 tablet by mouth daily.    . vitamin C (ASCORBIC ACID) 500 MG tablet Take 500 mg by mouth daily.    Marland Kitchen zinc gluconate 50 MG tablet Take 50 mg by mouth daily.     No current facility-administered medications for this visit.   Facility-Administered Medications Ordered in Other Visits  Medication Dose Route Frequency Provider Last Rate Last Admin  . influenza  inactive virus vaccine (FLUZONE/FLUARIX) injection 0.5 mL  0.5 mL Intramuscular Once Rita Ohara, MD        Allergies:   Horse-derived products, Pravastatin, and  Adhesive [tape]   Social History:  The patient  reports that he has never smoked. He has never used smokeless tobacco. He reports current alcohol use. He reports that he does not use drugs.   Family History:  The patient's family history includes Cancer in his mother; Dementia in his mother and paternal aunt; Diabetes in  his maternal grandmother and sister; Heart attack in his maternal grandfather; Heart disease in his maternal grandfather; Melanoma (age of onset: 71) in his mother; Parkinson's disease in his maternal aunt; Stroke (age of onset: 32) in his father; Stroke (age of onset: 46) in his sister.  ROS:  Please see the history of present illness.    All other systems are reviewed and otherwise negative.   PHYSICAL EXAM:  VS:  There were no vitals taken for this visit. BMI: There is no height or weight on file to calculate BMI. Well nourished, well developed, in no acute distress  HEENT: normocephalic, atraumatic  Neck: no JVD, carotid bruits or masses Cardiac:  RRR; no significant murmurs, no rubs, or gallops Lungs: CTA b/l, no wheezing, rhonchi or rales  Abd: soft, nontender MS: no deformity or atrophy Ext: no edema  Skin: warm and dry, no rash Neuro:  No gross deficits appreciated Psych: euthymic mood, full affect  PPM site is stable, no tethering or discomfort   EKG:  Not done today  PPM interrogation done today and reviewed by myself:  Battery and lead measurements are good No further AF, no arrhythmias CorVue is above threshold and trending upwards    08/16/15: TTE Study Conclusions - Left ventricle: The cavity size was mildly dilated. Wall   thickness was normal. Systolic function was mildly reduced. The   estimated ejection fraction was in the range of 45% to 50%.   Diffuse hypokinesis. Doppler parameters are consistent with   abnormal left ventricular relaxation (grade 1 diastolic   dysfunction). - Aortic valve: There was trivial regurgitation. - Mitral valve:  There was mild regurgitation directed centrally. - Left atrium: The atrium was mildly dilated. - Pulmonary arteries: Systolic pressure was mildly increased. PA   peak pressure: 32 mm Hg (S). Impressions: - When compared to prior, there is mild improvement in EF (from   40%)  10/29/14 LVEF 35-40% 06/30/14: LVEF 55-60%  Recent Labs: No results found for requested labs within last 8760 hours.  No results found for requested labs within last 8760 hours.   CrCl cannot be calculated (Patient's most recent lab result is older than the maximum 21 days allowed.).   Wt Readings from Last 3 Encounters:  06/26/19 222 lb (100.7 kg)  12/07/18 209 lb 7 oz (95 kg)  09/05/18 214 lb 12.8 oz (97.4 kg)     Other studies reviewed: Additional studies/records reviewed today include: summarized above  ASSESSMENT AND PLAN:  1. H/o advanced heart block, PPM >> CRT-P     Intact function, no programming changes made     98% BiVe pacing  2. NICM 3. Chronic CHF (systolic), NYHA I     LVEF 45-50% by last echo     No symptoms or exam findings to suggest volume OL     CorVue above threshold and trending upwards      On BB, ACE       4. HTN     No changes today     Better today   5. Paroxysmal Afib     He has had historically rare disorganized atrial arrhythmia, no sustained AF     In Aug he had one episode of nearly 10 hours of AFib     None further     His CHA2DS2Vasc score is 4     Dr. Harrington Challenger in agreement to start [facility]     We revisited rational for a/c and potential bleeding/risks, he remains agreeable  He  had his colonoscopy polyp was removed (reported to be benign to him), he has had some intermittent hemorrhoidal bleeding (as he does at baseline), and is pending a repeat hemorrhoid surgery, waiting to see about scheduling He had recent acute onset of some dental pain, sees his dentist tomorrow, worried about an old root canal and possible need for extraction He also sees pulmonology  tomorrow  He will follow up with GI to get a timeline on his hemorrhoid surgery, and communicate with [place] that information as well as any dental, pulmonary issues, procedures recommended/required. Once we are clear about these things, will plan to get him started on Xarelto 20mg  daily and stop his ASA  We will otherwise plan a virtual visit in a few months to follow up on him being new to a/c, and concerns, issues. Continue Q65mo remotes       Disposition:  As above   Current medicines are reviewed at length with the patient today.  The patient did not have any concerns regarding medicines.  Venetia Night, PA-C 07/19/2019 5:28 AM     [place]  [place] [address] [phone] (office)  [phone] (fax)

## 2019-07-20 ENCOUNTER — Telehealth: Payer: Self-pay | Admitting: *Deleted

## 2019-07-20 ENCOUNTER — Other Ambulatory Visit: Payer: Self-pay

## 2019-07-20 ENCOUNTER — Ambulatory Visit (INDEPENDENT_AMBULATORY_CARE_PROVIDER_SITE_OTHER): Payer: No Typology Code available for payment source | Admitting: Physician Assistant

## 2019-07-20 VITALS — BP 122/60 | HR 62 | Ht 72.0 in | Wt 218.0 lb

## 2019-07-20 DIAGNOSIS — I480 Paroxysmal atrial fibrillation: Secondary | ICD-10-CM

## 2019-07-20 DIAGNOSIS — I5022 Chronic systolic (congestive) heart failure: Secondary | ICD-10-CM | POA: Diagnosis not present

## 2019-07-20 DIAGNOSIS — Z950 Presence of cardiac pacemaker: Secondary | ICD-10-CM

## 2019-07-20 DIAGNOSIS — I428 Other cardiomyopathies: Secondary | ICD-10-CM | POA: Diagnosis not present

## 2019-07-20 NOTE — Telephone Encounter (Signed)
Virtual Visit Pre-Appointment Phone Call  , I am calling you today to discuss your upcoming appointment. We are currently trying to limit exposure to the virus that causes COVID-19 by seeing patients at home rather than in the office."  1. "What is the BEST phone number to call the day of the visit?" - include this in appointment notes  2. "Do you have or have access to (through a family member/friend) a smartphone with video capability that we can use for your visit?" a. If yes - list this number in appt notes as "cell" (if different from BEST phone #) and list the appointment type as a VIDEO visit in appointment notes b. If no - list the appointment type as a PHONE visit in appointment notes  3. Confirm consent - "In the setting of the current Covid19 crisis, you are scheduled for a (phone or video) visit with your provider on (date) at (time).  Just as we do with many in-office visits, in order for you to participate in this visit, we must obtain consent.  If you'd like, I can send this to your mychart (if signed up) or email for you to review.  Otherwise, I can obtain your verbal consent now.  All virtual visits are billed to your insurance company just like a normal visit would be.  By agreeing to a virtual visit, we'd like you to understand that the technology does not allow for your provider to perform an examination, and thus may limit your provider's ability to fully assess your condition. If your provider identifies any concerns that need to be evaluated in person, we will make arrangements to do so.  Finally, though the technology is pretty good, we cannot assure that it will always work on either your or our end, and in the setting of a video visit, we may have to convert it to a phone-only visit.  In either situation, we cannot ensure that we have a secure connection.  Are you willing to proceed?" STAFF: Did the patient verbally acknowledge consent to telehealth visit? Document YES/NO  here: YES   4. Advise patient to be prepared - "Two hours prior to your appointment, go ahead and check your blood pressure, pulse, oxygen saturation, and your weight (if you have the equipment to check those) and write them all down. When your visit starts, your provider will ask you for this information. If you have an Apple Watch or Kardia device, please plan to have heart rate information ready on the day of your appointment. Please have a pen and paper handy nearby the day of the visit as well."  5. Give patient instructions for MyChart download to smartphone OR Doximity/Doxy.me as below if video visit (depending on what platform provider is using)  6. Inform patient they will receive a phone call 15 minutes prior to their appointment time (may be from unknown caller ID) so they should be prepared to answer    TELEPHONE CALL NOTE  Alexander Curtis has been deemed a candidate for a follow-up tele-health visit to limit community exposure during the Covid-19 pandemic. I spoke with the patient via phone to ensure availability of phone/video source, confirm preferred email & phone number, and discuss instructions and expectations.  I reminded Alexander Curtis to be prepared with any vital sign and/or heart rhythm information that could potentially be obtained via home monitoring, at the time of his visit. I reminded Alexander Curtis to expect a phone call prior  to his visit.  Alexander Curtis, [place] 07/20/2019 10:14 AM   INSTRUCTIONS FOR DOWNLOADING THE MYCHART APP TO SMARTPHONE  - The patient must first make sure to have activated MyChart and know their login information - If Apple, go to [employer] and type in MyChart in the search bar and download the app. If Android, ask patient to go to [employer] and type in [place] in the search bar and download the app. The app is free but as with any other app downloads, their phone may require them to verify saved payment information or Apple/Android  password.  - The patient will need to then log into the app with their MyChart username and password, and select [facility] as their healthcare provider to link the account. When it is time for your visit, go to the MyChart app, find appointments, and click Begin Video Visit. Be sure to Select Allow for your device to access the Microphone and Camera for your visit. You will then be connected, and your provider will be with you shortly.  **If they have any issues connecting, or need assistance please contact MyChart service desk [phone])**  **If using a computer, in order to ensure the best quality for their visit they will need to use either of the following Internet Browsers: [employer], or Google Chrome**  IF USING DOXIMITY or DOXY.ME - The patient will receive a link just prior to their visit by text.     FULL LENGTH CONSENT FOR TELE-HEALTH VISIT   I hereby voluntarily request, consent and authorize [facility] and its employed or contracted physicians, physician assistants, nurse practitioners or other licensed health care professionals (the Practitioner), to provide me with telemedicine health care services (the "Services") as deemed necessary by the treating Practitioner. I acknowledge and consent to receive the Services by the Practitioner via telemedicine. I understand that the telemedicine visit will involve communicating with the Practitioner through live audiovisual communication technology and the disclosure of certain medical information by electronic transmission. I acknowledge that I have been given the opportunity to request an in-person assessment or other available alternative prior to the telemedicine visit and am voluntarily participating in the telemedicine visit.  I understand that I have the right to withhold or withdraw my consent to the use of telemedicine in the course of my care at any time, without affecting my right to future care or treatment,  and that the Practitioner or I may terminate the telemedicine visit at any time. I understand that I have the right to inspect all information obtained and/or recorded in the course of the telemedicine visit and may receive copies of available information for a reasonable fee.  I understand that some of the potential risks of receiving the Services via telemedicine include:  Marland Kitchen Delay or interruption in medical evaluation due to technological equipment failure or disruption; . Information transmitted may not be sufficient (e.g. poor resolution of images) to allow for appropriate medical decision making by the Practitioner; and/or  . In rare instances, security protocols could fail, causing a breach of personal health information.  Furthermore, I acknowledge that it is my responsibility to provide information about my medical history, conditions and care that is complete and accurate to the best of my ability. I acknowledge that Practitioner's advice, recommendations, and/or decision may be based on factors not within their control, such as incomplete or inaccurate data provided by me or distortions of diagnostic images or specimens that may result from electronic transmissions.  I understand that the practice of medicine is not an exact science and that Practitioner makes no warranties or guarantees regarding treatment outcomes. I acknowledge that I will receive a copy of this consent concurrently upon execution via email to the email address I last provided but may also request a printed copy by calling the office of [facility].    I understand that my insurance will be billed for this visit.   I have read or had this consent read to me. . I understand the contents of this consent, which adequately explains the benefits and risks of the Services being provided via telemedicine.  . I have been provided ample opportunity to ask questions regarding this consent and the Services and have had my questions  answered to my satisfaction. . I give my informed consent for the services to be provided through the use of telemedicine in my medical care  By participating in this telemedicine visit I agree to the above.

## 2019-07-20 NOTE — Patient Instructions (Signed)
Medication Instructions:   Your physician recommends that you continue on your current medications as directed. Please refer to the Current Medication list given to you today.  *If you need a refill on your cardiac medications before your next appointment, please call your pharmacy*  Lab Work: Your physician recommends that you continue on your current medications as directed. Please refer to the Current Medication list given to you today.   If you have labs (blood work) drawn today and your tests are completely normal, you will receive your results only by: Marland Kitchen MyChart Message (if you have MyChart) OR . A paper copy in the mail If you have any lab test that is abnormal or we need to change your treatment, we will call you to review the results.  Testing/Procedures: NONE ORDERED  TODAY    Follow-Up: At [facility], you and your health needs are our priority.  As part of our continuing mission to provide you with exceptional heart care, we have created designated Provider Care Teams.  These Care Teams include your primary Cardiologist (physician) and Advanced Practice Providers (APPs -  Physician Assistants and Nurse Practitioners) who all work together to provide you with the care you need, when you need it.  Your next appointment:   3 month(s)  The format for your next appointment:   In Person  Provider:   You may see Tommye Standard, PA-C    Other Instructions

## 2019-07-31 ENCOUNTER — Ambulatory Visit (INDEPENDENT_AMBULATORY_CARE_PROVIDER_SITE_OTHER): Payer: No Typology Code available for payment source

## 2019-07-31 DIAGNOSIS — I5022 Chronic systolic (congestive) heart failure: Secondary | ICD-10-CM | POA: Diagnosis not present

## 2019-07-31 DIAGNOSIS — Z950 Presence of cardiac pacemaker: Secondary | ICD-10-CM | POA: Diagnosis not present

## 2019-08-02 ENCOUNTER — Ambulatory Visit: Payer: Medicare Other | Attending: Family Medicine

## 2019-08-02 DIAGNOSIS — Z23 Encounter for immunization: Secondary | ICD-10-CM | POA: Insufficient documentation

## 2019-08-02 NOTE — Progress Notes (Signed)
   Covid-19 Vaccination Clinic  Name:  Alexander Curtis    MRN: [id number] DOB: 1946-02-05  08/02/2019  Mr. Spindel was observed post Covid-19 immunization for 30 minutes based on pre-vaccination screening without incidence. He was provided with Vaccine Information Sheet and instruction to access the V-Safe system.   Mr. Matsen was instructed to call 911 with any severe reactions post vaccine: Marland Kitchen Difficulty breathing  . Swelling of your face and throat  . A fast heartbeat  . A bad rash all over your body  . Dizziness and weakness    Immunizations Administered    Name Date Dose VIS Date Route   Pfizer COVID-19 Vaccine 08/02/2019 12:09 PM 0.3 mL 06/23/2019 Intramuscular   Manufacturer: [place]   Lot: [id number]   [place]: [id number]

## 2019-08-04 NOTE — Progress Notes (Signed)
EPIC Encounter for ICM Monitoring  Patient Name: ENDRIT GOODBAR is a 74 y.o. male Date: 08/04/2019 Primary Care Physican: Rita Ohara, MD Primary Cardiologist:Ross Electrophysiologist: Allred Bi-V Pacing: 97% 07/20/2019 Weight:218lbs (office weight)   Spoke with patient and he is doing well.  He has taken first COVID vaccine 2 days ago without any side effects.   Corvue thoracic impedancenormal.  Prescribed:No diuretic  Recommendations:  No changes and encouraged to call if experiencing any fluid symptoms.  Follow-up plan: ICM clinic phone appointment on 09/04/2019.   91 day device clinic remote transmission 09/12/2019.  Office appt 09/08/2019 with Dr Harrington Challenger and 09/19/2019 with Dillon Bjork, PA.  Copy of ICM check sent to Dr. Rayann Heman.   3 month ICM trend: 07/31/2019    1 Year ICM trend:       [name], RN 08/04/2019 8:31 AM

## 2019-08-07 DIAGNOSIS — K648 Other hemorrhoids: Secondary | ICD-10-CM | POA: Diagnosis not present

## 2019-08-22 ENCOUNTER — Ambulatory Visit: Payer: Medicare Other | Attending: Internal Medicine

## 2019-08-22 DIAGNOSIS — Z23 Encounter for immunization: Secondary | ICD-10-CM | POA: Insufficient documentation

## 2019-08-22 NOTE — Progress Notes (Signed)
   Covid-19 Vaccination Clinic  Name:  Alexander Curtis    MRN: [id number] DOB: Oct 23, 1945  08/22/2019  [name] was observed post Covid-19 immunization for 15 minutes without incidence. He was provided with Vaccine Information Sheet and instruction to access the V-Safe system.   [name] was instructed to call 911 with any severe reactions post vaccine: Marland Kitchen Difficulty breathing  . Swelling of your face and throat  . A fast heartbeat  . A bad rash all over your body  . Dizziness and weakness    Immunizations Administered    Name Date Dose VIS Date Route   Pfizer COVID-19 Vaccine 08/22/2019  1:44 PM 0.3 mL 06/23/2019 Intramuscular   Manufacturer: [place]   Lot: [id number]   [place]: [id number]

## 2019-09-04 ENCOUNTER — Ambulatory Visit (INDEPENDENT_AMBULATORY_CARE_PROVIDER_SITE_OTHER): Payer: No Typology Code available for payment source

## 2019-09-04 DIAGNOSIS — Z950 Presence of cardiac pacemaker: Secondary | ICD-10-CM | POA: Diagnosis not present

## 2019-09-04 DIAGNOSIS — I5022 Chronic systolic (congestive) heart failure: Secondary | ICD-10-CM | POA: Diagnosis not present

## 2019-09-06 NOTE — Progress Notes (Signed)
Cardiology Office Note   Date:  09/08/2019   ID:  Alexander Curtis, Alexander Curtis 15-Apr-1946, MRN [id number]  PCP:  Rita Ohara, MD  Cardiologist:   Dorris Carnes, MD   Pt presents for follow up ofNICM      History of Present Illness: Alexander Curtis is a 74 y.o. male with a history of high degree AV block   S/P PPM 2011  Echo I n 2016 LVEF 35 to 40%  NICM) Upgraded to BiV pacer.  Echo repeated in 2017 and LVEF 45 to 50%    The pt is followed by J Allred for device   He was last seen by Emogene Morgan in Dec 2020 (had one episodes of CP)  It was noted he had 10 hours of Afib in Aug (after device interrogation)  Anticoag held due to planned colonoscopy  Pt had also had some BRBPR.   Alos found to have ground glass on CT     In Jan he was seen again by Emogene Morgan and was doing well  Had colonscopy  Adenoma seen   Pt also reports hemorrhoid  Since the he denies CP   Breathing is OK   No palpitations  No edema   He remains very active  Still having some BRBPR    Trying to increase fiber and fluid  He is reluctant to start on anticoagulation      Current Meds  Medication Sig  . aspirin 81 MG tablet Take 81 mg by mouth every other day.  Marland Kitchen atorvastatin (LIPITOR) 40 MG tablet Take 40 mg by mouth daily.  Marland Kitchen b complex vitamins capsule Take 1 capsule by mouth daily.  . carvedilol (COREG) 6.25 MG tablet TAKE 1 TABLET(6.25 MG) BY MOUTH TWICE DAILY WITH A MEAL  . CINNAMON PO Take 2,000 mg by mouth 3 (three) times daily.   . Coenzyme Q10 (COQ10) 200 MG CAPS Take 1 capsule by mouth 2 (two) times daily.   . diphenhydrAMINE (BENADRYL) 50 MG tablet Take 50 mg by mouth at bedtime as needed for itching.  . fish oil-omega-3 fatty acids 1000 MG capsule Take 1 g by mouth 3 (three) times daily.   Marland Kitchen GABAPENTIN PO Take 1 tablet by mouth 3 (three) times daily. Pt unsure of strenght  . lisinopril (PRINIVIL,ZESTRIL) 20 MG tablet Take 20 mg by mouth daily.  . Magnesium 250 MG TABS Take 250 mg by mouth daily.  . metFORMIN (GLUCOPHAGE)  500 MG tablet Take 500 mg by mouth daily with breakfast.   . Multiple Vitamins-Minerals (MENS 50+ MULTI VITAMIN/MIN PO) Take 1 tablet by mouth daily.  . TURMERIC PO Take 1 tablet by mouth daily.  . vitamin C (ASCORBIC ACID) 500 MG tablet Take 500 mg by mouth daily.  Marland Kitchen zinc gluconate 50 MG tablet Take 50 mg by mouth daily.     Allergies:   Horse-derived products, Pravastatin, and Adhesive [tape]   Past Medical History:  Diagnosis Date  . Arthritis of shoulder    right (CT done 11/2018)  . Atrial enlargement, left   . Bradycardia 06/30/2014  . Diabetes mellitus, type 2 ([place])   . Diabetic peripheral neuropathy ([place]) 01/24/2017   [facility]  . HLD (hyperlipidemia)   . Hypertension 05/2013  . LBBB (left bundle branch block)   . Melanoma ([place]) 1968   R arm  . Mitral regurgitation   . Mobitz type 2 second degree heart block    a. s/p Medtronic Adapta  L model ADDRL 1 (serial number [id number]) pacemaker. 07/02/14 b.  upgrade to STJ CRTP 02/2015  . Non-ischemic cardiomyopathy ([place])    a. felt to be 2/2 RV pacing  . Obstructive sleep apnea    compliant with CPAP  . Pacemaker    a. Medtronic Adapta L model ADDRL 1 (serial number [id number]) pacemaker.   . Pulmonary nodules    noted on CT of shoulder 03/2016, up to 44mm in size on CT chest 04/2016. repeat 1 year due to h/o melanoma  . Syncope 2011   a. 2011 -negative workup except LBBB. thought to be vasovagal  . Tricuspid regurgitation     Past Surgical History:  Procedure Laterality Date  . EP IMPLANTABLE DEVICE N/A 03/01/2015   STJ CRTP upgrade by Dr Rayann Heman  . FINGER TENDON REPAIR     R 2nd and 3rd finger  . INGUINAL HERNIA REPAIR     bilateral  . KNEE ARTHROSCOPY  02/2011   L knee for meniscal tear. [name]  . PERMANENT PACEMAKER INSERTION N/A 07/01/2014   MDT ADDRL1 pacemaker implanted for Mobitz II Dr Rayann Heman  . UVULECTOMY     laser treatment for sleep apnea (NOT UP3)  . VASECTOMY        Social History:  The patient  reports that he has never smoked. He has never used smokeless tobacco. He reports current alcohol use. He reports that he does not use drugs.   Family History:  The patient's family history includes Cancer in his mother; Dementia in his mother and paternal aunt; Diabetes in his maternal grandmother and sister; Heart attack in his maternal grandfather; Heart disease in his maternal grandfather; Melanoma (age of onset: 75) in his mother; Parkinson's disease in his maternal aunt; Stroke (age of onset: 53) in his father; Stroke (age of onset: 34) in his sister.    ROS:  Please see the history of present illness. All other systems are reviewed and  Negative to the above problem except as noted.    PHYSICAL EXAM: VS:  BP 132/70   Pulse 72   Ht 6' (1.829 m)   Wt 220 lb 3.2 oz (99.9 kg)   SpO2 96%   BMI 29.86 kg/m   GEN: Well nourished, well developed, in no acute distress  HEENT: normal  Neck: JVP normal  No carotid bruits  Cardiac: RRR; no murmurs, rubs, or gallops,no edema  Respiratory:  clear to auscultation bilaterally, normal work of breathing GI: soft, nontender, nondistended, + BS  No hepatomegaly  MS: no deformity Moving all extremities   Skin: warm and dry, no rash Neuro:  Strength and sensation are intact Psych: euthymic mood, full affect   EKG:  EKG is not ordered today.   Lipid Panel    Component Value Date/Time   CHOL 127 09/02/2015 0001   TRIG 65 09/02/2015 0001   HDL 40 09/02/2015 0001   CHOLHDL 3.2 09/02/2015 0001   VLDL 13 09/02/2015 0001   LDLCALC 74 09/02/2015 0001      Wt Readings from Last 3 Encounters:  09/08/19 220 lb 3.2 oz (99.9 kg)  07/20/19 218 lb (98.9 kg)  06/26/19 222 lb (100.7 kg)      ASSESSMENT AND PLAN:  1  Chronic systolic CHF Volume status remains good   Keep on current regimen  2  AV block  Initially s/p PPM  Then upgrade to BiV PPM  Follows with J Allred  3   PAF  One  episode in Aug   WIll  review with EP for other recurrencecs   He is still with some BRBPR  Will also discuss with GI      4   HL  Continue statin  LDL from [place] [facility]) in June 2018 was 43   Continue    F/U in 1 year     Current medicines are reviewed at length with the patient today.  The patient does not have concerns regarding medicines.  Signed, Dorris Carnes, MD  09/08/2019 9:55 AM    [facility], [place], [place]  [address] Phone: [phone]; Fax: [phone]

## 2019-09-08 ENCOUNTER — Encounter: Payer: Self-pay | Admitting: Internal Medicine

## 2019-09-08 ENCOUNTER — Ambulatory Visit (INDEPENDENT_AMBULATORY_CARE_PROVIDER_SITE_OTHER): Payer: No Typology Code available for payment source | Admitting: Internal Medicine

## 2019-09-08 ENCOUNTER — Other Ambulatory Visit: Payer: Self-pay

## 2019-09-08 VITALS — BP 132/70 | HR 72 | Ht 72.0 in | Wt 220.2 lb

## 2019-09-08 DIAGNOSIS — I480 Paroxysmal atrial fibrillation: Secondary | ICD-10-CM

## 2019-09-08 DIAGNOSIS — I428 Other cardiomyopathies: Secondary | ICD-10-CM

## 2019-09-08 DIAGNOSIS — E782 Mixed hyperlipidemia: Secondary | ICD-10-CM

## 2019-09-08 NOTE — Patient Instructions (Signed)
Medication Instructions:  No changes *If you need a refill on your cardiac medications before your next appointment, please call your pharmacy*   Lab Work: At PCP--take prescription with you.  Testing/Procedures: None today  Follow-Up: At [facility], you and your health needs are our priority.  As part of our continuing mission to provide you with exceptional heart care, we have created designated Provider Care Teams.  These Care Teams include your primary Cardiologist (physician) and Advanced Practice Providers (APPs -  Physician Assistants and Nurse Practitioners) who all work together to provide you with the care you need, when you need it.  We recommend signing up for the patient portal called "MyChart".  Sign up information is provided on this After Visit Summary.  MyChart is used to connect with patients for Virtual Visits (Telemedicine).  Patients are able to view lab/test results, encounter notes, upcoming appointments, etc.  Non-urgent messages can be sent to your provider as well.   To learn more about what you can do with MyChart, go to [email or web address].    Your next appointment:   9 month(s)  The format for your next appointment:   Either In Person or Virtual  Provider:   You may see Dorris Carnes, MD or one of the following Advanced Practice Providers on your designated Care Team:    Richardson Dopp, PA-C  Vin Novato, [place]  Daune Perch, [place]

## 2019-09-08 NOTE — Progress Notes (Signed)
EPIC Encounter for ICM Monitoring  Patient Name: Alexander Curtis is a 74 y.o. male Date: 09/08/2019 Primary Care Physican: Rita Ohara, MD Primary Cardiologist:Ross Electrophysiologist: Allred Bi-V Pacing: 98% 09/08/2019 Weight:220lbs (office weight)   Spoke with patient and he is doing well.  He reports he had office visit this morning with Dr Harrington Challenger and she wants to continue to monitor for any Afib episodes.  He not on anticoagulant at this time.  Corvue thoracic impedancesuggesting possible fluid accumulation since 2/24.  Prescribed:No diuretic  Recommendations:  Recommendation to limit salt intake to 2000 mg daily and fluid intake to 64 oz daily.  Encouraged to call if experiencing any fluid symptoms.   Follow-up plan: ICM clinic phone appointment on 09/12/2019 to recheck fluid levels. 91 day device clinic remote transmission 09/12/2019. Office appt 09/08/2019 with Dr Harrington Challenger and 3/9/2021with Janeice Robinson.  Copy of ICM check sent to Dr.Allred.   3 month ICM trend: 09/08/2019    1 Year ICM trend:       [name], RN 09/08/2019 12:11 PM

## 2019-09-12 ENCOUNTER — Telehealth: Payer: Self-pay

## 2019-09-12 NOTE — Telephone Encounter (Signed)
Left message for patient to remind of missed remote transmission.  

## 2019-09-15 ENCOUNTER — Telehealth: Payer: Self-pay | Admitting: Physician Assistant

## 2019-09-15 NOTE — Telephone Encounter (Signed)
New message  Patient would like an explanation of what his virtual appt with Tommye Standard would be about 09/19/19. Please call to discuss.

## 2019-09-16 NOTE — Progress Notes (Deleted)
{Choose 1 Note Type (Video or Telephone):[phone]}   The patient was identified using 2 identifiers.  Date:  09/16/2019   ID:  [name], DOB 1945-07-27, MRN [id number]  {Patient Location:[phone]::"Home"} {Provider Location:[phone]::"Home"}  PCP:  Rita Ohara, MD  Cardiologist:  Dorris Carnes, MD  Electrophysiologist:  Dr. Rayann Heman  Evaluation Performed:  {Choose Visit [id number] Visit"}  Chief Complaint:  *** planned follow up  History of Present Illness:    SAVANNAH PANICK is a 74 y.o. male with history of DM, HTN, OSA w/CPAP, LBBB, NICM, chronic CHF (mixed), advanced heart block w/CRT-P.  He comes in today to be seen for Dr. Rayann Heman, last seen by him in Nov 2018.  At that time, doing well, noted only short and rare disorganized atrial arrhythmia, no sustained AF and urged CPAP compliance.  No changes to his tx or programming were made at this visit.  I saw him dec 2019, he was feeling very well, no CP, palpitations or SOB.  He reported very good exertional capacity, but realized his age as well.  No dizziness, near syncope or syncope.  He denied symptoms of PND or orthopnea.  No changes were made to his medicines or programming  He follows with ICM clinic.  He saw Dr. Harrington Challenger Feb this year, he was doing well, no changes were made.  I saw him Dec 2020, he reported feeling well.  Denied any kind of exertional intolerances.  Mentioned he tended to work outside much more when at his property in the [place], in the summer had resurfaced his driveway without exertional/cardiac symptoms.  He also push mows (propelled mower) his yard.  He described himself as very active.   He mentioned in May he had been on a roller coaster, felt fine, got off and walked to his car, and when he reached out to open the door, he got a central CP that caught his breath.  It took a few minutes to resolve.  He had not had it again despite physically demanding activities No dizzy spells,  no near syncope or syncope. No palpitations He reported [facility] including [facility] He had been found with pulmonary nodules  And "ground glass something or other" and was pending a pulmonary evaluation with the [facility] 07/20/2018 He was scheduled for his colonoscopy  (screening), later in the week He was found to have AFib via his PPM interrogation of nearly 10 hours, I was inclined to start a/c with CHA2DS2Vasc score of 4, though we would wait for his colonoscopy and he was agreeable but wanted Dr. Harrington Challenger (and/or Dr. Rayann Heman) to weigh in. I sent note to both, had an opportunity to discuss briefly with Dr. Harrington Challenger, she planned to review the chart and comment.    At his follow up in Jan 2021, he felt well, no recurrent CP, remained very active physically, [place], palpitations or SOB. Pacer check had not had further AF.  In discussion with Dr. Harrington Challenger, favored starting [facility] He was pending a number of things pending, a possible hemorrhoidal surgery, was also pending potential dental procedure/extraction, and was to see pulmonology the next day.  Once these things were settled, planned to let us know and get started on Xarelto.  He saw Dr. Harrington Challenger in Feb 2021, was still having BRBPR and was reluctant to start a/c and was to discuss with GI.  *** start a/c?   *** symptoms ***  any more AF? *** bleeding *** pulmonary?   Device information: SJM CRT-P, initial device implanted 07/01/14 >> LV lead/CRT upgrade 03/01/15  AFib Hx Noted 10hrs of AFib via his PPM Dec 2020 AAD hx None to date   Past Medical History:  Diagnosis Date  . Arthritis of shoulder    right (CT done 11/2018)  . Atrial enlargement, left   . Bradycardia 06/30/2014  . Diabetes mellitus, type 2 ([place])   . Diabetic peripheral neuropathy ([place]) 01/24/2017   [facility] and [facility]  . HLD (hyperlipidemia)   . Hypertension 05/2013  . LBBB (left  bundle branch block)   . Melanoma ([place]) 1968   R arm  . Mitral regurgitation   . Mobitz type 2 second degree heart block    a. s/p Medtronic Adapta L model ADDRL 1 (serial number NWE [id number]) pacemaker. 07/02/14 b.  upgrade to STJ CRTP 02/2015  . Non-ischemic cardiomyopathy ([place])    a. felt to be 2/2 RV pacing  . Obstructive sleep apnea    compliant with CPAP  . Pacemaker    a. Medtronic Adapta L model ADDRL 1 (serial number NWE [id number]) pacemaker.   . Pulmonary nodules    noted on CT of shoulder 03/2016, up to 74mm in size on CT chest 04/2016. repeat 1 year due to h/o melanoma  . Syncope 2011   a. 2011 -negative workup except LBBB. thought to be vasovagal  . Tricuspid regurgitation    Past Surgical History:  Procedure Laterality Date  . EP IMPLANTABLE DEVICE N/A 03/01/2015   STJ CRTP upgrade by Dr Rayann Heman  . FINGER TENDON REPAIR     R 2nd and 3rd finger  . INGUINAL HERNIA REPAIR     bilateral  . KNEE ARTHROSCOPY  02/2011   L knee for meniscal tear. [name]  . PERMANENT PACEMAKER INSERTION N/A 07/01/2014   MDT ADDRL1 pacemaker implanted for Mobitz II Dr Rayann Heman  . UVULECTOMY     laser treatment for sleep apnea (NOT UP3)  . VASECTOMY       No outpatient medications have been marked as taking for the 09/19/19 encounter (Appointment) with Baldwin Jamaica, PA-C.     Allergies:   Horse-derived products, Pravastatin, and Adhesive [tape]   Social History   Tobacco Use  . Smoking status: Never Smoker  . Smokeless tobacco: Never Used  Substance Use Topics  . Alcohol use: Yes    Comment: 3-4 drinks per month or less  . Drug use: No     Family Hx: The patient's family history includes Cancer in his mother; Dementia in his mother and paternal aunt; Diabetes in his maternal grandmother and sister; Heart attack in his maternal grandfather; Heart disease in his maternal grandfather; Melanoma (age of onset: 12) in his mother; Parkinson's disease in his maternal aunt; Stroke (age  of onset: 2) in his father; Stroke (age of onset: 73) in his sister. There is no history of Hypertension.  ROS:   Please see the history of present illness.    All other systems reviewed and are negative.   Prior CV studies:   The following studies were reviewed today:  08/16/15: TTE Study Conclusions - Left ventricle: The cavity size was mildly dilated. Wall thickness was normal. Systolic function was mildly reduced. The estimated ejection fraction was in the range of 45% to 50%. Diffuse hypokinesis. Doppler parameters are consistent with abnormal left ventricular relaxation (grade 1 diastolic dysfunction). - Aortic valve: There was  trivial regurgitation. - Mitral valve: There was mild regurgitation directed centrally. - Left atrium: The atrium was mildly dilated. - Pulmonary arteries: Systolic pressure was mildly increased. PA peak pressure: 32 mm Hg (S). Impressions: - When compared to prior, there is mild improvement in EF (from 40%)  10/29/14 LVEF 35-40% 06/30/14: LVEF 55-60%  Labs/Other Tests and Data Reviewed:    EKG:  {EKG/Telemetry Strips Reviewed:[phone]}  Recent Labs: No results found for requested labs within last 8760 hours.   Recent Lipid Panel Lab Results  Component Value Date/Time   CHOL 127 09/02/2015 12:01 AM   TRIG 65 09/02/2015 12:01 AM   HDL 40 09/02/2015 12:01 AM   CHOLHDL 3.2 09/02/2015 12:01 AM   LDLCALC 74 09/02/2015 12:01 AM    Wt Readings from Last 3 Encounters:  09/08/19 220 lb 3.2 oz (99.9 kg)  07/20/19 218 lb (98.9 kg)  06/26/19 222 lb (100.7 kg)     Objective:    Vital Signs:  There were no vitals taken for this visit.   {HeartCare Virtual Exam (Optional):[id number]::"VITAL SIGNS:  reviewed"}  ASSESSMENT & PLAN:    1. H/o advanced heart block, PPM >> CRT-P     Intact function,     *** 98% BiVe pacing at his last in  Clinic interrogation  2. NICM 3. Chronic CHF (systolic), NYHA I     LVEF 45-50% by last  echo     *** No symptoms to suggest volume OL     ***      On BB, ACE       4. HTN     *** No changes today       5. Paroxysmal Afib     He has had historically rare disorganized atrial arrhythmia, no sustained AF     In Aug 2020 he had one episode of nearly 10 hours of AFib     We have discussed rational for anticoagulation, initially agreeable though at his last visit with Dr. Harrington Challenger, was less inclined to get started and was held off, seems with ongoing trouble with hemorrhoidal bleeding particularly     ***     COVID-19 Education: The signs and symptoms of COVID-19 were discussed with the patient and how to seek care for testing (follow up with PCP or arrange E-visit).  ***The importance of social distancing was discussed today.  Time:   Today, I have spent *** minutes with the patient with telehealth technology discussing the above problems.     Medication Adjustments/Labs and Tests Ordered: Current medicines are reviewed at length with the patient today.  Concerns regarding medicines are outlined above.   Tests Ordered: No orders of the defined types were placed in this encounter.   Medication Changes: No orders of the defined types were placed in this encounter.   Follow Up:  {F/U Format:[phone]} {follow up:15908}  Signed, Baldwin Jamaica, PA-C  09/16/2019 6:58 PM    [facility]

## 2019-09-19 ENCOUNTER — Telehealth: Payer: No Typology Code available for payment source | Admitting: Physician Assistant

## 2019-09-19 NOTE — Progress Notes (Signed)
No ICM remote transmission received for 09/11/2019 and next ICM transmission scheduled for 10/16/2019.   

## 2019-09-25 LAB — HEMOGLOBIN A1C: Hemoglobin A1C: 6.5

## 2019-10-09 ENCOUNTER — Encounter: Payer: Self-pay | Admitting: *Deleted

## 2019-10-16 ENCOUNTER — Ambulatory Visit (INDEPENDENT_AMBULATORY_CARE_PROVIDER_SITE_OTHER): Payer: No Typology Code available for payment source

## 2019-10-16 DIAGNOSIS — Z950 Presence of cardiac pacemaker: Secondary | ICD-10-CM

## 2019-10-16 DIAGNOSIS — I5022 Chronic systolic (congestive) heart failure: Secondary | ICD-10-CM

## 2019-10-17 ENCOUNTER — Encounter: Payer: Self-pay | Admitting: Family Medicine

## 2019-10-17 DIAGNOSIS — Z950 Presence of cardiac pacemaker: Secondary | ICD-10-CM

## 2019-10-17 DIAGNOSIS — I5022 Chronic systolic (congestive) heart failure: Secondary | ICD-10-CM

## 2019-10-17 NOTE — Progress Notes (Signed)
EPIC Encounter for ICM Monitoring  Patient Name: Alexander Curtis is a 74 y.o. male Date: 10/17/2019 Primary Care Physican: Rita Ohara, MD Primary Cardiologist:Ross Electrophysiologist: Allred Bi-V Pacing: 98% 4/6/2021Weight:216lbs   Spoke with patient and reports feeling well at this time.  Denies fluid symptoms.    Corvue thoracic impedancenormal but was suggesting possible fluid accumulation from 3/31 to 4/4.  Prescribed:No diuretic  Recommendations: No changes and encouraged to call if experiencing any fluid symptoms.  Follow-up plan: ICM clinic phone appointment on5/04/2020. 91 day device clinic remote transmission6/07/2019.  Copy of ICM check sent to Dr.Allred.   3 month ICM trend: 10/16/2019    1 Year ICM trend:       [name], RN 10/17/2019 11:58 AM

## 2019-11-20 ENCOUNTER — Ambulatory Visit (INDEPENDENT_AMBULATORY_CARE_PROVIDER_SITE_OTHER): Payer: No Typology Code available for payment source

## 2019-11-20 DIAGNOSIS — I5022 Chronic systolic (congestive) heart failure: Secondary | ICD-10-CM

## 2019-11-20 DIAGNOSIS — Z950 Presence of cardiac pacemaker: Secondary | ICD-10-CM | POA: Diagnosis not present

## 2019-11-22 ENCOUNTER — Telehealth: Payer: Self-pay

## 2019-11-22 NOTE — Progress Notes (Signed)
EPIC Encounter for ICM Monitoring  Patient Name: Alexander Curtis is a 74 y.o. male Date: 11/22/2019 Primary Care Physican: Rita Ohara, MD Primary Cardiologist:Ross Electrophysiologist: Allred Bi-V Pacing: 98% 4/6/2021Weight:216lbs   Attempted call to patient and unable to reach.  Left detailed message per DPR regarding transmission. Transmission reviewed.   Corvue thoracic impedancenormal.  Prescribed:No diuretic  Recommendations: Left voice mail with ICM number and encouraged to call if experiencing any fluid symptoms.  Follow-up plan: ICM clinic phone appointment on6/14/2021. 91 day device clinic remote transmission6/07/2019.  Copy of ICM check sent to Dr.Allred.  3 month ICM trend: 11/20/2019    1 Year ICM trend:       [name], RN 11/22/2019 9:41 AM

## 2019-11-22 NOTE — Telephone Encounter (Signed)
Remote ICM transmission received.  Attempted call to patient regarding ICM remote transmission and left detailed message per DPR.  Advised to return call for any fluid symptoms or questions. Next ICM remote transmission scheduled 12/25/2019.     

## 2019-12-12 ENCOUNTER — Ambulatory Visit (INDEPENDENT_AMBULATORY_CARE_PROVIDER_SITE_OTHER): Payer: No Typology Code available for payment source | Admitting: *Deleted

## 2019-12-12 DIAGNOSIS — I441 Atrioventricular block, second degree: Secondary | ICD-10-CM | POA: Diagnosis not present

## 2019-12-18 LAB — CUP PACEART REMOTE DEVICE CHECK
Battery Remaining Longevity: 59 mo
Battery Remaining Percentage: 71 %
Battery Voltage: 2.92 V
Brady Statistic AP VP Percent: 26 %
Brady Statistic AP VS Percent: 1 %
Brady Statistic AS VP Percent: 72 %
Brady Statistic AS VS Percent: 1.1 %
Brady Statistic RA Percent Paced: 27 %
Date Time Interrogation Session: 20210604015004
Implantable Lead Implant Date: 20151220
Implantable Lead Implant Date: 20151220
Implantable Lead Implant Date: 20160819
Implantable Lead Location: 753858
Implantable Lead Location: 753859
Implantable Lead Location: 753860
Implantable Lead Model: 5076
Implantable Lead Model: 5076
Implantable Pulse Generator Implant Date: 20160819
Lead Channel Impedance Value: 360 Ohm
Lead Channel Impedance Value: 410 Ohm
Lead Channel Impedance Value: 410 Ohm
Lead Channel Pacing Threshold Amplitude: 0.5 V
Lead Channel Pacing Threshold Amplitude: 0.75 V
Lead Channel Pacing Threshold Amplitude: 1.125 V
Lead Channel Pacing Threshold Pulse Width: 0.5 ms
Lead Channel Pacing Threshold Pulse Width: 0.5 ms
Lead Channel Pacing Threshold Pulse Width: 0.8 ms
Lead Channel Sensing Intrinsic Amplitude: 1.3 mV
Lead Channel Sensing Intrinsic Amplitude: 3.3 mV
Lead Channel Setting Pacing Amplitude: 1.5 V
Lead Channel Setting Pacing Amplitude: 2 V
Lead Channel Setting Pacing Amplitude: 2.125
Lead Channel Setting Pacing Pulse Width: 0.5 ms
Lead Channel Setting Pacing Pulse Width: 0.8 ms
Lead Channel Setting Sensing Sensitivity: 0.7 mV
Pulse Gen Model: 3262
Pulse Gen Serial Number: 3099689

## 2019-12-18 NOTE — Progress Notes (Signed)
Remote pacemaker transmission.   

## 2019-12-20 ENCOUNTER — Ambulatory Visit: Payer: Medicare HMO | Admitting: Family Medicine

## 2019-12-22 ENCOUNTER — Other Ambulatory Visit: Payer: Self-pay

## 2019-12-22 ENCOUNTER — Ambulatory Visit (INDEPENDENT_AMBULATORY_CARE_PROVIDER_SITE_OTHER): Payer: No Typology Code available for payment source | Admitting: Physician Assistant

## 2019-12-22 ENCOUNTER — Telehealth: Payer: Self-pay

## 2019-12-22 ENCOUNTER — Encounter: Payer: Self-pay | Admitting: Physician Assistant

## 2019-12-22 VITALS — BP 102/62 | HR 69 | Ht 72.0 in | Wt 216.0 lb

## 2019-12-22 DIAGNOSIS — I480 Paroxysmal atrial fibrillation: Secondary | ICD-10-CM

## 2019-12-22 DIAGNOSIS — I428 Other cardiomyopathies: Secondary | ICD-10-CM

## 2019-12-22 DIAGNOSIS — Z950 Presence of cardiac pacemaker: Secondary | ICD-10-CM

## 2019-12-22 NOTE — Telephone Encounter (Signed)
I spoke to the patient and made him aware that he may come in now for appointment.  He verbalized understanding and is on the way.

## 2019-12-22 NOTE — Progress Notes (Signed)
Cardiology Office Note Date:  12/22/2019  [name]:  Alexander, Curtis October 09, 1945, MRN [id number] PCP:  Rita Ohara, MD  Cardiologist:  Dr. Harrington Challenger Electrophysiologist: Dr. Rayann Heman    Chief Complaint:   f/u visit  History of Present Illness: Alexander Curtis is a 74 y.o. male with history of DM, HTN, OSA w/CPAP, LBBB, NICM, chronic CHF (mixed), advanced heart block w/CRT-P.  He comes in today to be seen for Dr. Rayann Heman, last seen by him in Nov 2018.  At that time, doing well, noted only short and rare disorganized atrial arrhythmia, no sustained AF and urged CPAP compliance.  No changes to his tx or programming were made at this visit.  I saw him dec 2019, he was feeling very well, no CP, palpitations or SOB.  He reported very good exertional capacity, but realized his age as well.  No dizziness, near syncope or syncope.  He denied symptoms of PND or orthopnea.  No changes were made to his medicines or programming  He follows with ICM clinic.  He saw Dr. Harrington Challenger Feb this year, he was doing well, no changes were made.  I saw him Dec 2020, he reported feeling well.  Denies any kind of exertional intolerances.  Mentioned he tends to work outside much more when at his property in the [place], in the summer had resurfaced his driveway without exertional/cardiac symptoms.  He also push mows (propelled mower) his yard.  He described himself as very active.   He mentioned in May he had been on a roller coaster, felt fine, got off and walked to his car, and when he reached out to open the door, he got a central CP that caught his breath.  It took a few minutes to resolve.  He had not had it again despite physically demanding activities No dizzy spells, no near syncope or syncope. No palpitations He reported his PMD [facility] including [facility] He had been found with pulmonary nodules  And "ground glass something or other" and was pending a pulmonary evaluation with the [facility]  07/20/2018 He was scheduled for his colonoscopy  (screening), later in the week   He was found to have AFib via his PPM interrogation of nearly 10 hours, I was inclined to start a/c with CHA2DS2Vasc score of 4, though we would wait for his colonoscopy and he was agreeable but wanted Dr. Harrington Challenger (and/or Dr. Rayann Heman) to weigh in. I sent note to both, had an opportunity to discuss briefly with Dr. Harrington Challenger, she planned to review the chart and comment.    Jan 2021, I had f/u He feels well, no recurrent CP, remains very active physically, [place], palpitations or SOB Dr. Harrington Challenger also favored starting a/c, though the pt was again pending a possible repeat hemorrhoidal surgery, a tooth extraction, and pulmonary evaluation.  He was going to keep updated on these and any procedures with plans to start a/c.  Feb 2021 He saw Dr. Harrington Challenger.  He was still having recurrent BRBPR and hesitant to start a/c, planned to follow AF burden and f/u with GI  TODAY He continues to feel quite well.  [place] particularly with no Symptoms or exertional intolerances.  No CP, palpitations, SOB or DOE, no near syncope or syncope. He had f/u CT chest and reports being told pulm nodules were stable with plans to repeat in 78mo, if  continued stability will move surveillance CTs to annally (this he follows at [place]). He reports the rectal bleeding ultimately felt to be inflammation and recommended to increase his fiber and water some and this has helped quite a bit with minimal blood infrequently now with BM. He had his tooth taken care of as well.  He had labs at the [place] a couple days ago, sees his PMD next week  Device information: SJM CRT-P, initial device implanted 07/01/14 >> LV lead/CRT upgrade 03/01/15  Past Medical History:  Diagnosis Date  . Arthritis of shoulder    right (CT done 11/2018)  . Atrial enlargement, left   . Bradycardia 06/30/2014  .  Diabetes mellitus, type 2 ([place])   . Diabetic peripheral neuropathy ([place]) 01/24/2017   [facility] and [facility] gabapentin  . HLD (hyperlipidemia)   . Hypertension 05/2013  . LBBB (left bundle branch block)   . Melanoma ([place]) 1968   R arm  . Mitral regurgitation   . Mobitz type 2 second degree heart block    a. s/p Medtronic Adapta L model ADDRL 1 (serial number NWE [id number]) pacemaker. 07/02/14 b.  upgrade to STJ CRTP 02/2015  . Non-ischemic cardiomyopathy ([place])    a. felt to be 2/2 RV pacing  . Obstructive sleep apnea    compliant with CPAP  . Pacemaker    a. Medtronic Adapta L model ADDRL 1 (serial number NWE [id number]) pacemaker.   . Pulmonary nodules    noted on CT of shoulder 03/2016, up to 89mm in size on CT chest 04/2016. repeat 1 year due to h/o melanoma  . Syncope 2011   a. 2011 -negative workup except LBBB. thought to be vasovagal  . Tricuspid regurgitation     Past Surgical History:  Procedure Laterality Date  . EP IMPLANTABLE DEVICE N/A 03/01/2015   STJ CRTP upgrade by Dr Rayann Heman  . FINGER TENDON REPAIR     R 2nd and 3rd finger  . INGUINAL HERNIA REPAIR     bilateral  . KNEE ARTHROSCOPY  02/2011   L knee for meniscal tear. [name]  . PERMANENT PACEMAKER INSERTION N/A 07/01/2014   MDT ADDRL1 pacemaker implanted for Mobitz II Dr Rayann Heman  . UVULECTOMY     laser treatment for sleep apnea (NOT UP3)  . VASECTOMY      Current Outpatient Medications  Medication Sig Dispense Refill  . aspirin 81 MG tablet Take 81 mg by mouth every other day.    Marland Kitchen atorvastatin (LIPITOR) 40 MG tablet Take 40 mg by mouth daily.    Marland Kitchen b complex vitamins capsule Take 1 capsule by mouth daily.    . carvedilol (COREG) 6.25 MG tablet TAKE 1 TABLET(6.25 MG) BY MOUTH TWICE DAILY WITH A MEAL 180 tablet 1  . CINNAMON PO Take 2,000 mg by mouth 3 (three) times daily.     . Coenzyme Q10 (COQ10) 200 MG CAPS Take 1 capsule by mouth 2 (two) times daily.     . diphenhydrAMINE (BENADRYL) 50 MG  tablet Take 50 mg by mouth at bedtime as needed for itching.    . fish oil-omega-3 fatty acids 1000 MG capsule Take 1 g by mouth 3 (three) times daily.     Marland Kitchen GABAPENTIN PO Take 1 tablet by mouth 3 (three) times daily. Pt unsure of strenght    . lisinopril (PRINIVIL,ZESTRIL) 20 MG tablet Take 20 mg by mouth daily.    . Magnesium 250 MG TABS Take 250 mg by mouth  daily.    . metFORMIN (GLUCOPHAGE) 500 MG tablet Take 500 mg by mouth daily with breakfast.     . Multiple Vitamins-Minerals (MENS 50+ MULTI VITAMIN/MIN PO) Take 1 tablet by mouth daily.    . TURMERIC PO Take 1 tablet by mouth daily.    . vitamin C (ASCORBIC ACID) 500 MG tablet Take 500 mg by mouth daily.    Marland Kitchen zinc gluconate 50 MG tablet Take 50 mg by mouth daily.     No current facility-administered medications for this visit.   Facility-Administered Medications Ordered in Other Visits  Medication Dose Route Frequency Provider Last Rate Last Admin  . influenza  inactive virus vaccine (FLUZONE/FLUARIX) injection 0.5 mL  0.5 mL Intramuscular Once Rita Ohara, MD        Allergies:   Horse-derived products, Pravastatin, and Adhesive [tape]   Social History:  The patient  reports that he has never smoked. He has never used smokeless tobacco. He reports current alcohol use. He reports that he does not use drugs.   Family History:  The patient's family history includes Cancer in his mother; Dementia in his mother and paternal aunt; Diabetes in his maternal grandmother and sister; Heart attack in his maternal grandfather; Heart disease in his maternal grandfather; Melanoma (age of onset: 72) in his mother; Parkinson's disease in his maternal aunt; Stroke (age of onset: 33) in his father; Stroke (age of onset: 27) in his sister.  ROS:  Please see the history of present illness.    All other systems are reviewed and otherwise negative.   PHYSICAL EXAM:  VS:  There were no vitals taken for this visit. BMI: There is no height or weight on file  to calculate BMI. Well nourished, well developed, in no acute distress  HEENT: normocephalic, atraumatic  Neck: no JVD, carotid bruits or masses Cardiac:  RRR; no significant murmurs, no rubs, or gallops Lungs: CTA b/l, no wheezing, rhonchi or rales  Abd: soft, nontender MS: no deformity or atrophy Ext: no edema  Skin: warm and dry, no rash Neuro:  No gross deficits appreciated Psych: euthymic mood, full affect  PPM site is stable, no tethering or discomfort   EKG:  Not done today   PPM interrogation done today and reviewed by myself:  AS/BP Battery and lead measurements are good/stable No arrhythmias 98%BP    08/16/15: TTE Study Conclusions - Left ventricle: The cavity size was mildly dilated. Wall   thickness was normal. Systolic function was mildly reduced. The   estimated ejection fraction was in the range of 45% to 50%.   Diffuse hypokinesis. Doppler parameters are consistent with   abnormal left ventricular relaxation (grade 1 diastolic   dysfunction). - Aortic valve: There was trivial regurgitation. - Mitral valve: There was mild regurgitation directed centrally. - Left atrium: The atrium was mildly dilated. - Pulmonary arteries: Systolic pressure was mildly increased. PA   peak pressure: 32 mm Hg (S). Impressions: - When compared to prior, there is mild improvement in EF (from   40%)  10/29/14 LVEF 35-40% 06/30/14: LVEF 55-60%  Recent Labs: No results found for requested labs within last 8760 hours.  No results found for requested labs within last 8760 hours.   CrCl cannot be calculated (Patient's most recent lab result is older than the maximum 21 days allowed.).   Wt Readings from Last 3 Encounters:  09/08/19 220 lb 3.2 oz (99.9 kg)  07/20/19 218 lb (98.9 kg)  06/26/19 222 lb (100.7 kg)  Other studies reviewed: Additional studies/records reviewed today include: summarized above  ASSESSMENT AND PLAN:  1. H/o advanced heart block, PPM >> CRT-P      Intact function, no programming changes made     98% BiVe pacing  2. NICM 3. Chronic CHF (systolic), NYHA I     LVEF 45-50% by last echo     No symptoms or exam findings to suggest volume OL     CorVue looks OK      On BB, ACE      C/w Dr. Harrington Challenger      4. HTN     No changes today        5. Paroxysmal Afib     He has had historically rare disorganized atrial arrhythmia     In Aug 2020 he had one episode of nearly 10 hours of AFib     His CHA2DS2Vasc score is 4     After discussion with Dr. Harrington Challenger, prefers to monitor and revisit a/c if burden increases     0% since Jan 2021     AF alerts are on   Disposition:  Remotes Q 3 mo and EP in 1 year, sooner if needed.  C/w Dr. Harrington Challenger as usual otherwise   Current medicines are reviewed at length with the patient today.  The patient did not have any concerns regarding medicines.  Alexander Night, PA-C 12/22/2019 5:33 AM     [address] [place] [place] [id number] [phone] (office)  [phone] (fax)

## 2019-12-22 NOTE — Patient Instructions (Addendum)
Medication Instructions:  none *If you need a refill on your cardiac medications before your next appointment, please call your pharmacy*   Lab Work: none If you have labs (blood work) drawn today and your tests are completely normal, you will receive your results only by:  [place] (if you have MyChart) OR  A paper copy in the mail If you have any lab test that is abnormal or we need to change your treatment, we will call you to review the results.   Testing/Procedures: none   Follow-Up: At [facility], you and your health needs are our priority.  As part of our continuing mission to provide you with exceptional heart care, we have created designated Provider Care Teams.  These Care Teams include your primary Cardiologist (physician) and Advanced Practice Providers (APPs -  Physician Assistants and Nurse Practitioners) who all work together to provide you with the care you need, when you need it.  We recommend signing up for the patient portal called "MyChart".  Sign up information is provided on this After Visit Summary.  MyChart is used to connect with patients for Virtual Visits (Telemedicine).  Patients are able to view lab/test results, encounter notes, upcoming appointments, etc.  Non-urgent messages can be sent to your provider as well.   To learn more about what you can do with MyChart, go to [email or web address].    Your next appointment:   1 year  The format for your next appointment:   Either In Person or Virtual  Provider:   Dr Rayann Heman   Other Instructions Remote monitoring is used to monitor your Pacemaker from home. This monitoring reduces the number of office visits required to check your device to one time per year. It allows [place] to keep an eye on the functioning of your device to ensure it is working properly. You are scheduled for a device check from home on 12/25/19. You may send your transmission at any time that day. If you have a wireless device,  the transmission will be sent automatically. After your physician reviews your transmission, you will receive a postcard with your next transmission date.

## 2019-12-25 ENCOUNTER — Ambulatory Visit (INDEPENDENT_AMBULATORY_CARE_PROVIDER_SITE_OTHER): Payer: No Typology Code available for payment source

## 2019-12-25 DIAGNOSIS — Z950 Presence of cardiac pacemaker: Secondary | ICD-10-CM | POA: Diagnosis not present

## 2019-12-25 DIAGNOSIS — I5022 Chronic systolic (congestive) heart failure: Secondary | ICD-10-CM

## 2019-12-26 NOTE — Progress Notes (Signed)
Chief Complaint  Patient presents with  . Medicare Wellness    fasting AWV, no CPE-had annual exam last Thurs at [place], they did rectal and prostate exam-they did not do labwork, only UA. No new concerns. Had diabetic eye exam in 2020 through [place] and had diabetic foot exam last Thursday.   Alexander Curtis is a 74 y.o. male who presents for Medicare Annual Wellness Visit, and f/u on chronic problems. He is followed by the [place] (and gets medications from them), forwards his labwork here, and is seen here just once yearly.  He was found to have AFib via his PPM interrogation (02/2019).  There was concern about starting anti-coagulation given that he need tooth extracted and was having hemorrhoidal bleeding.  Cardiology was going to look at afib burden to determine if anticoagulation should be started. He was seen by cardiology in February and earlier this month. No significant recurrent afib has been noted per notes.  Heart block--s/p pacemaker.  Followed by Dr. Harrington Challenger and Dr. Rayann Heman and doing well. No longer is having any fatigue or dyspnea on exertion.  CHF/cardiomyopathy:stable/controlled. No longer on any diuretics.He denies edema, dyspnea, chest pain.  Hyperlipidemia: Patient has been taking lipitor '40mg'$ regularly, as well as fish oil.He is trying to follow a lowfat, low cholesterol diet.  At goal per last labs at the [place], in 09/2019:  TC 113 LDL 54, HDL 47 TG 60.  Diabetes--he was originallyprescribedmetforminback in October 2015, but didn't actually start taking it until summer 2018, when he developed neuropathy in his feet. He denies side effects.  A1c was 6.5 at [place] in 09/2019. Denies polydipsia, polyuria, hypoglycemia; diabetic eye exam isdone at [facility], didn't have it done last year (due to [place], and also he states he was told he didn't need to return for 2 years). Neuropathy in hands/feet--improved since on gabapentin (has pins and needles, more in his hands than his feet, often positional, at  night).  He previously had abnormal monofilament exam at [facility], and has since seen podiatrist.  Hypertension: [facility] BP [facility] the other day.  Recalls 101/69 the other day.  Reports compliance with taking lisinopril '20mg'$ daily, and carvedilol BID.No headaches,dizziness, chest pain, cough or otherside effects of medication.   Hearing loss: has hearing aids, plans to get a new one through [place] soon.  They fall out a lot related to wearing mask.  H/o skin cancers. Under the care of dermatologist.  Pulmonary nodules--these are monitored by CT at the [facility] and have remained stable. Last chest CT 09/2019 stable GG nodule RLL, sl increased. recheck 6 mos  Sleep apnea--[name] compliant withusing CPAP and tolerating well without problems. "Can't sleep without it!"  He denies unrefreshed sleep or daytime somnolence.  R shoulder pain:  Has seen [name]. He had CT 11/25/2018: IMPRESSION: 1. Mild glenohumeral osteoarthritis. 2. Mild to moderate acromioclavicular osteoarthritis. He is s/p PT and injections.  Has pain with overuse only, rarely  Immunization History  Administered Date(s) Administered  . Fluad Quad(high Dose 65+) 04/05/2019  . Influenza Split 05/02/2012  . Influenza, High Dose Seasonal PF 04/12/2014, 05/07/2016, 09/14/2017  . Influenza-Unspecified 03/28/2015  . PFIZER SARS-COV-2 Vaccination 08/02/2019, 08/22/2019  . Pneumococcal Conjugate-13 08/14/2015  . Pneumococcal Polysaccharide-23 03/28/2012  . Zoster 12/31/2011  . Zoster Recombinat (Shingrix) 12/21/2019   allergic to tetanus  Last colonoscopy:06/2019 at [facility], 1 small tubular adenoma.  5 year f/u recommended. (prior was 10/2016-- showed internal hemorrhoids, diverticulosis, nonbleeding angio ectasia; 2 yr follow-up was  recommended due to poor prep). Wasn't recommended to have hemorrhoids treated; bleeding resolved with increased fiber intake. Last PSA  Lab Results  Component Value Date   PSA1 0.4  09/14/2017   PSA 0.5 08/17/2016   PSA 0.48 09/02/2015   PSA 0.52 07/16/2014   Dentist: twice yearly Ophtho:yearly at the [facility], missed last year due to COVID Exercise: very active with [place] (projects, yardwork, mowing with propelled mower). Walks dog (80 lb boxer)2 miles once a day, and 2 smaller walks daily, 20 minutes each. Hepatitis C screen done at [facility] 01/2016  Other doctors caring for patient include: Cardiology: Dr. Harrington Challenger, Dr. Rayann Heman Ophtho: at [facility] PJ:KDTOI--ZT. [place] [facility]--Dr.Khaishoonn Lonia Chimera Dentist:Dr. Truman Hayward Dermatologist: at [facility], every 6 months(Dr. Verner Chol) Ortho: [name], Dr. Veverly Fells Podiatrist: Dr. Su Hoff, saw Dr. Joya Gaskins at [facility]    Depression screen: Negative Fall screen: one--tripped once (got caught in a hole/depressed area with water meter), no injury Functional status screen--notable for only slightly blurry vision from cataracts (mild), decreased hearing (has bilateral hearing aids, hard to wear with  Mask).Very slight issue with memory. See full screen in epic. Mini-Cog: normal  End of Life Discussion: Patient has a living will and medical power of attorney  PMH, PSH, SH and FH were reviewed and updated  Outpatient Encounter Medications as of 12/27/2019  Medication Sig  . aspirin 81 MG tablet Take 81 mg by mouth every other day.  Marland Kitchen atorvastatin (LIPITOR) 40 MG tablet Take 40 mg by mouth daily.  . carvedilol (COREG) 6.25 MG tablet TAKE 1 TABLET(6.25 MG) BY MOUTH TWICE DAILY WITH A MEAL  . CINNAMON PO Take 2,000 mg by mouth 3 (three) times daily.   . Coenzyme Q10 (COQ10) 200 MG CAPS Take 1 capsule by mouth 2 (two) times daily.   Marland Kitchen FIBER SELECT GUMMIES PO Take 1 each by mouth daily.  . fish oil-omega-3 fatty acids 1000 MG capsule Take 1 g by mouth 3 (three) times daily.   Marland Kitchen gabapentin (NEURONTIN) 100 MG capsule Take 100 mg by mouth 2 (two) times daily.  Marland Kitchen lisinopril (PRINIVIL,ZESTRIL) 20 MG tablet Take 20 mg by mouth daily.  . metFORMIN  (GLUCOPHAGE) 500 MG tablet Take 500 mg by mouth daily with breakfast.   . Multiple Vitamins-Minerals (MENS 50+ MULTI VITAMIN/MIN PO) Take 1 tablet by mouth daily.  . TURMERIC PO Take 1 tablet by mouth daily.  . [DISCONTINUED] b complex vitamins capsule Take 1 capsule by mouth daily.  . [DISCONTINUED] vitamin C (ASCORBIC ACID) 500 MG tablet Take 500 mg by mouth daily.  . diphenhydrAMINE (BENADRYL) 50 MG tablet Take 50 mg by mouth at bedtime as needed for itching. (Patient not taking: Reported on 12/27/2019)  . [DISCONTINUED] GABAPENTIN PO Take 1 tablet by mouth 3 (three) times daily. Pt unsure of strenght  . [DISCONTINUED] Magnesium 250 MG TABS Take 250 mg by mouth daily.  . [DISCONTINUED] zinc gluconate 50 MG tablet Take 50 mg by mouth daily.   Facility-Administered Encounter Medications as of 12/27/2019  Medication  . influenza  inactive virus vaccine (FLUZONE/FLUARIX) injection 0.5 mL   Allergies  Allergen Reactions  . Horse-Derived Products Anaphylaxis    REACTION: Anaphlactic Reaction.  Can't take tetanus shot  . Pravastatin Other (See Comments)    Joint pain   . Adhesive [Tape] Itching and Rash   ROS:The patient denies anorexia, fever, weight changes, headaches, vision loss (decreased from cataracts, stable), ear pain, hoarseness, chest pain, palpitations, dizziness, syncope, dyspnea on exertion, cough, swelling, nausea, vomiting,  diarrhea, constipation, abdominal pain, melena, hematochezia, (rectal bleeding resolved with increased fiber intake), no indigestion/heartburn, hematuria, incontinence, nocturia, weakened urine stream, dysuria, genital lesions, weakness, tremor, suspicious skin lesions, depression, anxiety, abnormal bleeding/bruising, or enlarged lymph nodes Tingling in toes and fingers, improved with neurontin, mainly just some tingling in hands at night now, positional R shoulder pain improved  Small right-sided hernia hasn't changed, nontender   PHYSICAL EXAM:  BP  128/70   Pulse 72   Temp (!) 97.1 F (36.2 C) (Tympanic)   Ht 6\' 1"  (1.854 m)   Wt 215 lb (97.5 kg)   BMI 28.37 kg/m   Wt Readings from Last 3 Encounters:  12/27/19 215 lb (97.5 kg)  12/22/19 216 lb (98 kg)  09/08/19 220 lb 3.2 oz (99.9 kg)    General Appearance:  Alert, cooperative, no distress, appears stated age. Elected not to change into gown, since full PE done by PCP at the 09/10/19  Head:  Normocephalic, without obvious abnormality, atraumatic  Eyes:  PERRL, conjunctiva/corneas clear, EOM's intact, fundi benign  Ears:  Normal TM's and EAC's  Nose:   Not examined, wearing mask due to COVID-19 pandemic  Throat:   Not examined, wearing mask due to COVID-19 pandemic  Neck: Supple, no lymphadenopathy; thyroid: no enlargement/ tenderness/nodules; no carotid bruit or JVD  Back:  Spine nontender, no curvature, ROM normal, no CVAtenderness  Lungs:  Clear to auscultation bilaterally without wheezes, rales or ronchi; respirations unlabored  Chest Wall:  No tenderness or deformity. Pacemaker in LU chest.  Heart:  Regular rate and rhythm, S1 and S2 normal, no murmur, rub or gallop  Breast Exam:  No chest wall tenderness, masses or gynecomastia  Abdomen:  Soft, non-tender, nondistended, normoactive bowel sounds, no masses, no hepatosplenomegaly  Genitalia:   Deferred to doctor at the [place]  Rectal:  Deferred to doctor at the [place]  Extremities: No clubbing, cyanosis or edema; dry, calloused feet.  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 R great toenail has some thickening/discoloration distally. Nails are long. Some callouses bilaterally  Pulses: 2+ and symmetric all extremities  Skin: Skin color, turgor normal, no rashes or lesions.    Lymph nodes: Cervical, supraclavicular, and axillary nodes normal  Neurologic: CNII-XII intact, normal strength, sensation and gait; reflexes 2+ and symmetric throughout   Psych: Normal mood, affect, hygiene and grooming   09/2019 [facility] labs A1c 6.5, plt 130, rest of CBC, c-met nl, TC 113 LDL 54, HDL 47 TG 60. Low plt--similar to prior, stable. Urine microalb also done at [facility]   ASSESSMENT/PLAN:  Medicare annual wellness visit, subsequent  Type 2 diabetes mellitus with diabetic neuropathy, without long-term current use of insulin (HCC) - controlled on metformin, prescribed and monitored at [facility]. Discussed the fact that he is taking once daily, no XR  Diabetic peripheral neuropathy (HCC) - improved on gabapentin  Nonischemic cardiomyopathy (HCC) - doing well, without any flare  Pulmonary nodules - stable per last CT, slightly enlarged, 6 month repeat CT to be done at [facility]  Obstructive sleep apnea - compliant with CPAP, and feels much better  Pacemaker - getting monthly checks  Pure hypercholesterolemia - lipids controlled on current statin, per labs 09/2019  Essential hypertension, benign - controlled  Diabetes mellitus with coincident hypertension (HCC)  Thrombocytopenia (HCC) - mild, stable  Screening for prostate cancer - discussed that screening not rec after age 79, last check - Plan: PSA  [facility] with [facility] in [facility] for  2nd Shingrix vaccine  Call only if PSA abnormal  Discussed PSA screening (risks/benefits, has been very low/stable, this will be last check, unless increases significantly), recommended at least 30 minutes of aerobic activity at least 5 days/week, weight-bearing exercise 2x/week; proper sunscreen use reviewed; healthy diet and alcohol recommendations (less than or equal to 2 drinks/day) reviewed; regular seatbelt use; changing batteries in smoke detectors, having carbon monoxide detector. Immunization recommendations discussed--continue to get yearly high dose flu shots in the Fall. Reminded to get 2nd Shingrix in 2 months.Allergic to Tetanus. Colonoscopy recommendations reviewed--UTD, repeat  06/2024.  MOST form reviewed. Full Code, Full care F/u 1 year, sooner prn    Medicare Attestation I have personally reviewed: The patient's medical and social history Their use of alcohol, tobacco or illicit drugs Their current medications and supplements The patient's functional ability including ADLs,fall risks, home safety risks, cognitive, and hearing and visual impairment Diet and physical activities Evidence for depression or mood disorders  The patient's weight, height, BMI have been recorded in the chart.  I have made referrals, counseling, and provided education to the patient based on review of the above and I have provided the patient with a written personalized care plan for preventive services.

## 2019-12-26 NOTE — Patient Instructions (Addendum)
  HEALTH MAINTENANCE RECOMMENDATIONS:  It is recommended that you get at least 30 minutes of aerobic exercise at least 5 days/week (for weight loss, you may need as much as 60-90 minutes). This can be any activity that gets your heart rate up. This can be divided in 10-15 minute intervals if needed, but try and build up your endurance at least once a week.  Weight bearing exercise is also recommended twice weekly.  Eat a healthy diet with lots of vegetables, fruits and fiber.  "Colorful" foods have a lot of vitamins (ie green vegetables, tomatoes, red peppers, etc).  Limit sweet tea, regular sodas and alcoholic beverages, all of which has a lot of calories and sugar.  Up to 2 alcoholic drinks daily may be beneficial for men (unless trying to lose weight, watch sugars).  Drink a lot of water.  Sunscreen of at least SPF 30 should be used on all sun-exposed parts of the skin when outside between the hours of 10 am and 4 pm (not just when at beach or pool, but even with exercise, golf, tennis, and yard work!)  Use a sunscreen that says "broad spectrum" so it covers both UVA and UVB rays, and make sure to reapply every 1-2 hours.  Remember to change the batteries in your smoke detectors when changing your clock times in the spring and fall. Carbon monoxide detectors are recommended for your home.  Use your seat belt every time you are in a car, and please drive safely and not be distracted with cell phones and texting while driving.    Mr. Alexander Curtis , Thank you for taking time to come for your Medicare Wellness Visit. I appreciate your ongoing commitment to your health goals. Please review the following plan we discussed and let me know if I can assist you in the future.    This is a list of the screening recommended for you and due dates:  Health Maintenance  Topic Date Due  . Eye exam for diabetics  10/14/2018  . Complete foot exam   09/02/2019  . Flu Shot  02/11/2020  . Hemoglobin A1C  03/27/2020    . Colon Cancer Screening  06/27/2024  . COVID-19 Vaccine  Completed  .  Hepatitis C: One time screening is recommended by Center for Disease Control  (CDC) for  adults born from [birth year] through 1966.   Completed  . Pneumonia vaccines  Completed   Continue yearly flu shots in the Fall  Remember to get the 2nd dose of Shingrix (between 2 and 6 months from the first).  Yearly diabetic eye exams are recommended.  I need copies of the reports/visits to complete your [facility] record, and then it will reflect when you are truly due next.  If it has been over a year, please be sure to schedule your yearly eye exam.

## 2019-12-27 ENCOUNTER — Other Ambulatory Visit: Payer: Self-pay

## 2019-12-27 ENCOUNTER — Ambulatory Visit (INDEPENDENT_AMBULATORY_CARE_PROVIDER_SITE_OTHER): Payer: Medicare HMO | Admitting: Family Medicine

## 2019-12-27 ENCOUNTER — Encounter: Payer: Self-pay | Admitting: Family Medicine

## 2019-12-27 VITALS — BP 128/70 | HR 72 | Temp 97.1°F | Ht 73.0 in | Wt 215.0 lb

## 2019-12-27 DIAGNOSIS — I428 Other cardiomyopathies: Secondary | ICD-10-CM | POA: Diagnosis not present

## 2019-12-27 DIAGNOSIS — Z0000 Encounter for general adult medical examination without abnormal findings: Secondary | ICD-10-CM

## 2019-12-27 DIAGNOSIS — E7800 Pure hypercholesterolemia, unspecified: Secondary | ICD-10-CM | POA: Diagnosis not present

## 2019-12-27 DIAGNOSIS — E1140 Type 2 diabetes mellitus with diabetic neuropathy, unspecified: Secondary | ICD-10-CM | POA: Diagnosis not present

## 2019-12-27 DIAGNOSIS — G4733 Obstructive sleep apnea (adult) (pediatric): Secondary | ICD-10-CM

## 2019-12-27 DIAGNOSIS — Z125 Encounter for screening for malignant neoplasm of prostate: Secondary | ICD-10-CM | POA: Diagnosis not present

## 2019-12-27 DIAGNOSIS — R918 Other nonspecific abnormal finding of lung field: Secondary | ICD-10-CM | POA: Diagnosis not present

## 2019-12-27 DIAGNOSIS — Z950 Presence of cardiac pacemaker: Secondary | ICD-10-CM

## 2019-12-27 DIAGNOSIS — D696 Thrombocytopenia, unspecified: Secondary | ICD-10-CM

## 2019-12-27 DIAGNOSIS — I10 Essential (primary) hypertension: Secondary | ICD-10-CM | POA: Diagnosis not present

## 2019-12-27 DIAGNOSIS — E119 Type 2 diabetes mellitus without complications: Secondary | ICD-10-CM

## 2019-12-27 DIAGNOSIS — E1142 Type 2 diabetes mellitus with diabetic polyneuropathy: Secondary | ICD-10-CM | POA: Diagnosis not present

## 2019-12-28 LAB — PSA: Prostate Specific Ag, Serum: 0.4 ng/mL (ref 0.0–4.0)

## 2019-12-29 NOTE — Progress Notes (Signed)
EPIC Encounter for ICM Monitoring  Patient Name: Alexander Curtis is a 74 y.o. male Date: 12/29/2019 Primary Care Physican: Rita Ohara, MD Primary Cardiologist:Ross Electrophysiologist: Allred Bi-V Pacing: 97% 6/11/2021Weight:216lbs   Spoke with wife, per DPR.  She reports pt is doing well and no fluid symptoms.   Corvue thoracic impedancenormal.  Prescribed:No diuretic  Recommendations: No changes and encouraged to call if experiencing any fluid symptoms.  Follow-up plan: ICM clinic phone appointment on7/19/2021. 91 day device clinic remote transmission9/01/2020.  Copy of ICM check sent to Dr.Allred.   3 month ICM trend: 12/27/2019    1 Year ICM trend:       [name], RN 12/29/2019 1:37 PM

## 2020-01-29 ENCOUNTER — Ambulatory Visit (INDEPENDENT_AMBULATORY_CARE_PROVIDER_SITE_OTHER): Payer: No Typology Code available for payment source

## 2020-01-29 DIAGNOSIS — I5022 Chronic systolic (congestive) heart failure: Secondary | ICD-10-CM | POA: Diagnosis not present

## 2020-01-29 DIAGNOSIS — Z950 Presence of cardiac pacemaker: Secondary | ICD-10-CM | POA: Diagnosis not present

## 2020-02-02 NOTE — Progress Notes (Signed)
EPIC Encounter for ICM Monitoring  Patient Name: Alexander Curtis is a 74 y.o. male Date: 02/02/2020 Primary Care Physican: Rita Ohara, MD Primary Cardiologist:Ross Electrophysiologist: Allred Bi-V Pacing: 98% 6/11/2021Weight:216lbs   Spoke with wife and reports feeling pt well at this time.  Denies fluid symptoms.    Corvue thoracic impedancenormal.  Prescribed:No diuretic  Recommendations: No changes and encouraged to call if experiencing any fluid symptoms.  Follow-up plan: ICM clinic phone appointment on8/23/2021. 91 day device clinic remote transmission9/01/2020.  EP/Cardiology Office Visits: Recalls 06/04/2020 with Dr. Harrington Challenger and 12/21/2020 with D Allred.    Copy of ICM check sent to Dr. Rayann Heman.   3 month ICM trend: 02/02/2020    1 Year ICM trend:       [name], RN 02/02/2020 1:24 PM

## 2020-03-04 ENCOUNTER — Ambulatory Visit (INDEPENDENT_AMBULATORY_CARE_PROVIDER_SITE_OTHER): Payer: No Typology Code available for payment source

## 2020-03-04 DIAGNOSIS — I5022 Chronic systolic (congestive) heart failure: Secondary | ICD-10-CM | POA: Diagnosis not present

## 2020-03-04 DIAGNOSIS — Z950 Presence of cardiac pacemaker: Secondary | ICD-10-CM

## 2020-03-06 NOTE — Progress Notes (Signed)
EPIC Encounter for ICM Monitoring  Patient Name: Alexander Curtis is a 74 y.o. male Date: 03/06/2020 Primary Care Physican: Rita Ohara, MD Primary Cardiologist:Ross Electrophysiologist: Allred Bi-V Pacing: 98% 6/11/2021Weight:216lbs   Spoke with wife and reports feeling pt well at this time.  Denies fluid symptoms.    Corvue thoracic impedancenormal but was suggesting possible fluid accumulation from 7/27-8/12.  Prescribed:No diuretic  Recommendations: No changes and encouraged to call if experiencing any fluid symptoms.  Follow-up plan: ICM clinic phone appointment on9/27/2021. 91 day device clinic remote transmission9/01/2020.  EP/Cardiology Office Visits: Recalls 06/04/2020 with Dr. Harrington Challenger and 12/21/2020 with D Allred.    Copy of ICM check sent to Dr. Rayann Heman.    3 month ICM trend: 03/04/2020    1 Year ICM trend:       [name], RN 03/06/2020 12:37 PM

## 2020-03-08 NOTE — Progress Notes (Signed)
Spoke with patient.  Transmission reviewed and he is unsure why he had the fluid during decreased impedance.  He was on vacation in early July and weight increased to 217 lbs but he has been urinating a lot since taking Metformin for the last 2 months and weight has decreased to 205 lbs.  He is feeling fine at this time.  No changes and encouraged him to call if experiencing any fluid symptoms.

## 2020-03-19 ENCOUNTER — Ambulatory Visit (INDEPENDENT_AMBULATORY_CARE_PROVIDER_SITE_OTHER): Payer: No Typology Code available for payment source | Admitting: *Deleted

## 2020-03-19 DIAGNOSIS — I441 Atrioventricular block, second degree: Secondary | ICD-10-CM

## 2020-03-22 LAB — CUP PACEART REMOTE DEVICE CHECK
Battery Remaining Longevity: 53 mo
Battery Remaining Percentage: 63 %
Battery Voltage: 2.9 V
Brady Statistic AP VP Percent: 25 %
Brady Statistic AP VS Percent: 1 %
Brady Statistic AS VP Percent: 73 %
Brady Statistic AS VS Percent: 1.1 %
Brady Statistic RA Percent Paced: 25 %
Date Time Interrogation Session: 20210909010735
Implantable Lead Implant Date: 20151220
Implantable Lead Implant Date: 20151220
Implantable Lead Implant Date: 20160819
Implantable Lead Location: 753858
Implantable Lead Location: 753859
Implantable Lead Location: 753860
Implantable Lead Model: 5076
Implantable Lead Model: 5076
Implantable Pulse Generator Implant Date: 20160819
Lead Channel Impedance Value: 390 Ohm
Lead Channel Impedance Value: 430 Ohm
Lead Channel Impedance Value: 430 Ohm
Lead Channel Pacing Threshold Amplitude: 0.5 V
Lead Channel Pacing Threshold Amplitude: 0.625 V
Lead Channel Pacing Threshold Amplitude: 1 V
Lead Channel Pacing Threshold Pulse Width: 0.5 ms
Lead Channel Pacing Threshold Pulse Width: 0.5 ms
Lead Channel Pacing Threshold Pulse Width: 0.8 ms
Lead Channel Sensing Intrinsic Amplitude: 1.3 mV
Lead Channel Sensing Intrinsic Amplitude: 3.6 mV
Lead Channel Setting Pacing Amplitude: 1.5 V
Lead Channel Setting Pacing Amplitude: 2 V
Lead Channel Setting Pacing Amplitude: 2 V
Lead Channel Setting Pacing Pulse Width: 0.5 ms
Lead Channel Setting Pacing Pulse Width: 0.8 ms
Lead Channel Setting Sensing Sensitivity: 0.7 mV
Pulse Gen Model: 3262
Pulse Gen Serial Number: 3099689

## 2020-03-22 NOTE — Progress Notes (Signed)
Remote pacemaker transmission.   

## 2020-04-10 NOTE — Progress Notes (Signed)
No ICM remote transmission received for 04/08/2020 and next ICM transmission scheduled for 05/13/2020.

## 2020-04-17 DIAGNOSIS — R69 Illness, unspecified: Secondary | ICD-10-CM | POA: Diagnosis not present

## 2020-05-09 DIAGNOSIS — Z20822 Contact with and (suspected) exposure to covid-19: Secondary | ICD-10-CM | POA: Diagnosis not present

## 2020-05-13 ENCOUNTER — Ambulatory Visit (INDEPENDENT_AMBULATORY_CARE_PROVIDER_SITE_OTHER): Payer: No Typology Code available for payment source

## 2020-05-13 DIAGNOSIS — Z950 Presence of cardiac pacemaker: Secondary | ICD-10-CM | POA: Diagnosis not present

## 2020-05-13 DIAGNOSIS — I5022 Chronic systolic (congestive) heart failure: Secondary | ICD-10-CM

## 2020-05-17 NOTE — Progress Notes (Signed)
EPIC Encounter for ICM Monitoring  Patient Name: Alexander Curtis is a 74 y.o. male Date: 05/17/2020 Primary Care Physican: Rita Ohara, MD Primary Cardiologist:Ross Electrophysiologist: Allred Bi-V Pacing: 98% 6/11/2021Weight:216lbs   Transmission reviewed.  Corvue thoracic impedancenormal.  Prescribed:No diuretic  Recommendations: No changes.  Follow-up plan: ICM clinic phone appointment on12/02/2020. 91 day device clinic remote transmission12/01/2020.  EP/Cardiology Office Visits:06/10/2020 with Dr.Ross.  Recall for 12/21/2020 with Dr Rayann Heman.   Copy of ICM check sent to Dr.Allred.    3 month ICM trend: 05/13/2020    1 Year ICM trend:       [name], RN 05/17/2020 5:06 PM

## 2020-06-05 ENCOUNTER — Telehealth: Payer: Self-pay | Admitting: Physician Assistant

## 2020-06-05 NOTE — Telephone Encounter (Signed)
Reviewed results from 11/1 /2021 transmission and ICM notes with patient. Confirmed that he ia aware of call scheduled for 06/19/20.

## 2020-06-05 NOTE — Telephone Encounter (Signed)
Patient calling to speak with Margarita Grizzle to follow up.

## 2020-06-10 ENCOUNTER — Ambulatory Visit: Payer: No Typology Code available for payment source | Admitting: Internal Medicine

## 2020-06-18 ENCOUNTER — Ambulatory Visit (INDEPENDENT_AMBULATORY_CARE_PROVIDER_SITE_OTHER): Payer: No Typology Code available for payment source

## 2020-06-18 DIAGNOSIS — I441 Atrioventricular block, second degree: Secondary | ICD-10-CM | POA: Diagnosis not present

## 2020-06-19 ENCOUNTER — Ambulatory Visit (INDEPENDENT_AMBULATORY_CARE_PROVIDER_SITE_OTHER): Payer: No Typology Code available for payment source

## 2020-06-19 DIAGNOSIS — I5022 Chronic systolic (congestive) heart failure: Secondary | ICD-10-CM

## 2020-06-19 DIAGNOSIS — Z950 Presence of cardiac pacemaker: Secondary | ICD-10-CM | POA: Diagnosis not present

## 2020-06-21 ENCOUNTER — Telehealth: Payer: Self-pay

## 2020-06-21 LAB — CUP PACEART REMOTE DEVICE CHECK
Battery Remaining Longevity: 46 mo
Battery Remaining Percentage: 56 %
Battery Voltage: 2.89 V
Brady Statistic AP VP Percent: 25 %
Brady Statistic AP VS Percent: 1 %
Brady Statistic AS VP Percent: 73 %
Brady Statistic AS VS Percent: 1.1 %
Brady Statistic RA Percent Paced: 25 %
Date Time Interrogation Session: 20211209045340
Implantable Lead Implant Date: 20151220
Implantable Lead Implant Date: 20151220
Implantable Lead Implant Date: 20160819
Implantable Lead Location: 753858
Implantable Lead Location: 753859
Implantable Lead Location: 753860
Implantable Lead Model: 5076
Implantable Lead Model: 5076
Implantable Pulse Generator Implant Date: 20160819
Lead Channel Impedance Value: 400 Ohm
Lead Channel Impedance Value: 430 Ohm
Lead Channel Impedance Value: 440 Ohm
Lead Channel Pacing Threshold Amplitude: 0.5 V
Lead Channel Pacing Threshold Amplitude: 0.625 V
Lead Channel Pacing Threshold Amplitude: 1 V
Lead Channel Pacing Threshold Pulse Width: 0.5 ms
Lead Channel Pacing Threshold Pulse Width: 0.5 ms
Lead Channel Pacing Threshold Pulse Width: 0.8 ms
Lead Channel Sensing Intrinsic Amplitude: 1.5 mV
Lead Channel Sensing Intrinsic Amplitude: 3.8 mV
Lead Channel Setting Pacing Amplitude: 1.5 V
Lead Channel Setting Pacing Amplitude: 2 V
Lead Channel Setting Pacing Amplitude: 2 V
Lead Channel Setting Pacing Pulse Width: 0.5 ms
Lead Channel Setting Pacing Pulse Width: 0.8 ms
Lead Channel Setting Sensing Sensitivity: 0.7 mV
Pulse Gen Model: 3262
Pulse Gen Serial Number: 3099689

## 2020-06-21 NOTE — Progress Notes (Signed)
EPIC Encounter for ICM Monitoring  Patient Name: Alexander Curtis is a 74 y.o. male Date: 06/21/2020 Primary Care Physican: Rita Ohara, MD Primary Cardiologist:Ross Electrophysiologist: Allred Bi-V Pacing: 98% 6/11/2021Weight:216lbs   Attempted call to patient and unable to reach.  Left detailed message per DPR regarding transmission. Transmission reviewed.   Corvue thoracic impedancenormal.  Prescribed:No diuretic  Recommendations: Left voice mail with ICM number and encouraged to call if experiencing any fluid symptoms.  Follow-up plan: ICM clinic phone appointment on1/04/2021. 91 day device clinic remote transmission3/02/2021.  EP/Cardiology Office Visits:08/06/2020 with Dr.Ross.  Recall for 12/21/2020 with Dr Rayann Heman.   Copy of ICM check sent to Dr.Allred.  3 month ICM trend: 06/20/2020    1 Year ICM trend:       [name], RN 06/21/2020 1:08 PM

## 2020-06-21 NOTE — Telephone Encounter (Signed)
Remote ICM transmission received.  Attempted call to patient regarding ICM remote transmission and left detailed message per DPR.  Advised to return call for any fluid symptoms or questions. Next ICM remote transmission scheduled 07/22/2020.

## 2020-06-24 ENCOUNTER — Telehealth: Payer: Self-pay

## 2020-06-24 NOTE — Telephone Encounter (Signed)
Returned patient call as requested by voice mail message.  Patient describes "having a heart burn feeling in chest area" but only on left side for 1 week when he moves his left arm to turn on car signal or bending over. He only feels this twinge of something but has difficulty describing the symptoms. He has never been dx with heartburn and feels it may be related to his device and not heart burn. Explained sometimes device can cause diaphragmatic stimulation.  Advised I will send message to device clinic and have them call to schedule a device clinic appointment to check if he may be having  diaphragmatic stimulation.  He said that would be fine.    Message sent to device clinic triage requesting call to patient to set up device clinic office appointment.

## 2020-06-25 NOTE — Telephone Encounter (Signed)
Forwarding to scheduling to assist patient with appt.

## 2020-07-01 NOTE — Progress Notes (Signed)
Remote pacemaker transmission.   

## 2020-07-07 NOTE — Progress Notes (Signed)
Cardiology Office Note Date:  07/07/2020  [name]:  Alexander, Curtis [birth year], MRN [id number] PCP:  Rita Ohara, MD  Cardiologist:  Dr. Harrington Challenger Electrophysiologist: Dr. Rayann Heman    Chief Complaint:   CP  History of Present Illness: Alexander Curtis is a 74 y.o. male with history of DM, HTN, OSA w/CPAP, LBBB, NICM, chronic CHF (mixed), advanced heart block w/CRT-P.  He comes in today to be seen for Dr. Rayann Heman, last seen by him in Nov 2018.  At that time, doing well, noted only short and rare disorganized atrial arrhythmia, no sustained AF and urged CPAP compliance.  No changes to his tx or programming were made at this visit.  I saw him dec 2019, he was feeling very well, no CP, palpitations or SOB.  He reported very good exertional capacity, but realized his age as well.  No dizziness, near syncope or syncope.  He denied symptoms of PND or orthopnea.  No changes were made to his medicines or programming  He follows with Dr. Harrington Challenger and  ICM clinic.  Dec 2020 He was found to have AFib via his PPM interrogation of nearly 10 hours, I was inclined to start a/c with CHA2DS2Vasc score of 4, though we would wait for his colonoscopy and he was agreeable but wanted Dr. Harrington Challenger (and/or Dr. Rayann Heman) to weigh in. I sent note to both, had an opportunity to discuss briefly with Dr. Harrington Challenger, she planned to review the chart and comment.    Jan 2021, I had f/u He feels well, no recurrent CP, remains very active physically, [place] without [place], palpitations or SOB Dr. Harrington Challenger also favored starting a/c, though the pt was again pending a possible repeat hemorrhoidal surgery, a tooth extraction, and pulmonary evaluation.  He was going to keep updated on these and any procedures with plans to start a/c.  Feb 2021 He saw Dr. Harrington Challenger.  He was still having recurrent BRBPR and hesitant to start a/c, planned to follow AF burden and f/u with GI  I saw him June 2021 He continues to feel  quite well.  [place] very hard [place] his [place] properties particularly with no Symptoms or exertional intolerances.  No CP, palpitations, SOB or DOE, no near syncope or syncope. He had f/u CT chest and reports being told pulm nodules were stable with plans to repeat in 58mo, if continued stability will move surveillance CTs to annally (this he follows at [place]). He reports the rectal bleeding ultimately felt to be inflammation and recommended to increase his fiber and water some and this has helped quite a bit with minimal blood infrequently now with BM. He had his tooth taken care of as well. He had labs at the [place] a couple days ago, sees his PMD next week NO AF was noted, continued surveillance planned No changes were made.  Pt called 06/24/20 with CP, RN fielding the call wondered about diaphrag stim, and device clinic had the patient placed on my schedule.  TODAY He is feeling well. Says a couple weeks ago for about a week he was getting an unusual CP.  Most predictably would happen when driving and reaching with his L arm to put the blinker on.  though not every time he would raise his l arm. It was not otherwise positional or exertional.  Was L chest and fairly fleeting, no associated symptoms. He would get it randomly at other times as well, bu not as  reliably with that motion.  Not tender to touch. None in about a week now.  Feels quite well remains very active with no exertional intolerances, no dizzy spells, no near syncope or syncope. No CP, SOB or DOE He will only occasionally see hemorrhoidal bleeding remains on fiber and good hydration or this. Just back from a [place] Cruise t[place] and surrounding area, had a great time, did a lot of activities, feels very good.  He [facility], reportedly all his labs were good, he will be able to have a copy available for his visit with dr. Harrington Challenger next month.   Device information: SJM CRT-P, initial device implanted 07/01/14 >> LV  lead/CRT upgrade 03/01/15  Past Medical History:  Diagnosis Date  . Arthritis of shoulder    right (CT done 11/2018)  . Atrial enlargement, left   . Bradycardia 06/30/2014  . Diabetes mellitus, type 2 ([place])   . Diabetic peripheral neuropathy ([place]) 01/24/2017   [facility], and prescribed gabapentin  . HLD (hyperlipidemia)   . Hypertension 05/2013  . LBBB (left bundle branch block)   . Melanoma ([place]) 1968   R arm  . Mitral regurgitation   . Mobitz type 2 second degree heart block    a. s/p Medtronic Adapta L model ADDRL 1 (serial number [id number]) pacemaker. 07/02/14 b.  upgrade to STJ CRTP 02/2015  . Non-ischemic cardiomyopathy ([place])    a. felt to be 2/2 RV pacing  . Obstructive sleep apnea    compliant with CPAP  . Pacemaker    a. Medtronic Adapta L model ADDRL 1 (serial number [id number]) pacemaker.   . Pulmonary nodules    noted on CT of shoulder 03/2016, up to 82mm in size on CT chest 04/2016. repeat 1 year due to h/o melanoma  . Syncope 2011   a. 2011 -negative workup except LBBB. thought to be vasovagal  . Tricuspid regurgitation     Past Surgical History:  Procedure Laterality Date  . EP IMPLANTABLE DEVICE N/A 03/01/2015   STJ CRTP upgrade by Dr Rayann Heman  . FINGER TENDON REPAIR     R 2nd and 3rd finger  . INGUINAL HERNIA REPAIR     bilateral  . KNEE ARTHROSCOPY  02/2011   L knee for meniscal tear. [name]  . PERMANENT PACEMAKER INSERTION N/A 07/01/2014   MDT ADDRL1 pacemaker implanted for Mobitz II Dr Rayann Heman  . UVULECTOMY     laser treatment for sleep apnea (NOT UP3)  . VASECTOMY      Current Outpatient Medications  Medication Sig Dispense Refill  . aspirin 81 MG tablet Take 81 mg by mouth every other day.    Marland Kitchen atorvastatin (LIPITOR) 40 MG tablet Take 40 mg by mouth daily.    . carvedilol (COREG) 6.25 MG tablet TAKE 1 TABLET(6.25 MG) BY MOUTH TWICE DAILY WITH A MEAL 180 tablet 1  . CINNAMON PO Take 2,000 mg by mouth 3 (three) times daily.     .  Coenzyme Q10 (COQ10) 200 MG CAPS Take 1 capsule by mouth 2 (two) times daily.     . diphenhydrAMINE (BENADRYL) 50 MG tablet Take 50 mg by mouth at bedtime as needed for itching. (Patient not taking: Reported on 12/27/2019)    . FIBER SELECT GUMMIES PO Take 1 each by mouth daily.    . fish oil-omega-3 fatty acids 1000 MG capsule Take 1 g by mouth 3 (three) times daily.     Marland Kitchen  gabapentin (NEURONTIN) 100 MG capsule Take 100 mg by mouth 2 (two) times daily.    Marland Kitchen lisinopril (PRINIVIL,ZESTRIL) 20 MG tablet Take 20 mg by mouth daily.    . metFORMIN (GLUCOPHAGE) 500 MG tablet Take 500 mg by mouth daily with breakfast.     . Multiple Vitamins-Minerals (MENS 50+ MULTI VITAMIN/MIN PO) Take 1 tablet by mouth daily.    . TURMERIC PO Take 1 tablet by mouth daily.     No current facility-administered medications for this visit.   Facility-Administered Medications Ordered in Other Visits  Medication Dose Route Frequency Provider Last Rate Last Admin  . influenza  inactive virus vaccine (FLUZONE/FLUARIX) injection 0.5 mL  0.5 mL Intramuscular Once Rita Ohara, MD        Allergies:   Horse-derived products, Pravastatin, and Adhesive [tape]   Social History:  The patient  reports that he has never smoked. He has never used smokeless tobacco. He reports current alcohol use. He reports that he does not use drugs.   Family History:  The patient's family history includes Cancer in his mother; Dementia in his mother and paternal aunt; Diabetes in his maternal grandmother and sister; Heart attack in his maternal grandfather; Heart disease in his maternal grandfather; Melanoma (age of onset: 15) in his mother; Parkinson's disease in his maternal aunt; Stroke (age of onset: 22) in his father; Stroke (age of onset: 67) in his sister.  ROS:  Please see the history of present illness.    All other systems are reviewed and otherwise negative.   PHYSICAL EXAM:  VS:  There were no vitals taken for this visit. BMI: There is  no height or weight on file to calculate BMI. Well nourished, well developed, in no acute distress  HEENT: normocephalic, atraumatic  Neck: no JVD, carotid bruits or masses Cardiac:  RRR; no significant murmurs, no rubs, or gallops Lungs: CTA b/l, no wheezing, rhonchi or rales  Abd: soft, nontender MS: no deformity or atrophy Ext: no edema  Skin: warm and dry, no rash Neuro:  No gross deficits appreciated Psych: euthymic mood, full affect  PPM site is stable, no tethering or discomfort   EKG:  Done today and reviewed by myself SR, V paced, unchanged   PPM interrogation done today and reviewed by myself:  Battery and lead measurements are good One AMS a brief AT/AFlutter 8 seconds No noise reversions No noise noted today with pocket manipulation, isometrics or L arm movements, and no reproduction of his symptoms High voltage pacing done through all leads without producing his symptom    08/16/15: TTE Study Conclusions - Left ventricle: The cavity size was mildly dilated. Wall   thickness was normal. Systolic function was mildly reduced. The   estimated ejection fraction was in the range of 45% to 50%.   Diffuse hypokinesis. Doppler parameters are consistent with   abnormal left ventricular relaxation (grade 1 diastolic   dysfunction). - Aortic valve: There was trivial regurgitation. - Mitral valve: There was mild regurgitation directed centrally. - Left atrium: The atrium was mildly dilated. - Pulmonary arteries: Systolic pressure was mildly increased. PA   peak pressure: 32 mm Hg (S). Impressions: - When compared to prior, there is mild improvement in EF (from   40%)  10/29/14 LVEF 35-40% 06/30/14: LVEF 55-60%  Recent Labs: No results found for requested labs within last 8760 hours.  No results found for requested labs within last 8760 hours.   CrCl cannot be calculated (Patient's most recent lab result  is older than the maximum 21 days allowed.).   Wt Readings  from Last 3 Encounters:  12/27/19 215 lb (97.5 kg)  12/22/19 216 lb (98 kg)  09/08/19 220 lb 3.2 oz (99.9 kg)     Other studies reviewed: Additional studies/records reviewed today include: summarized above  ASSESSMENT AND PLAN:  1. H/o advanced heart block, PPM >> CRT-P     intact function, no programming changes made     >99% BiVe pacing  2. NICM 3. Chronic CHF (systolic), NYHA I     LVEF 45-50% by last echo     No symptoms or exam findings to suggest volume OL     CorVue was slightly below threhold though uptrending     On BB, ACE      C/w Dr. Harrington Challenger      4. HTN      No changes today        5. Paroxysmal Afib     He has had historically rare disorganized atrial arrhythmia     In Aug 2020 he had one episode of nearly 10 hours of AFib, pt reluctant to start a/c     His CHA2DS2Vasc score is 4     8 seconds of AT/AFluter     Continue to monitor  6. CP     Very atypical, sounds musculoskeletal     Unable to reproduce today via device     None in a week    Disposition:  continue remotes, sees Dr. Harrington Challenger next month, plan for EP in a year, sooner if needed   Current medicines are reviewed at length with the patient today.  The patient did not have any concerns regarding medicines.  Venetia Night, PA-C 07/07/2020 12:46 PM     [address] [place] [address] [phone] (office)  [phone] (fax)

## 2020-07-08 ENCOUNTER — Other Ambulatory Visit: Payer: Self-pay

## 2020-07-08 ENCOUNTER — Encounter: Payer: Self-pay | Admitting: Physician Assistant

## 2020-07-08 ENCOUNTER — Ambulatory Visit (INDEPENDENT_AMBULATORY_CARE_PROVIDER_SITE_OTHER): Payer: No Typology Code available for payment source | Admitting: Physician Assistant

## 2020-07-08 VITALS — BP 120/70 | HR 60 | Ht 73.0 in | Wt 218.0 lb

## 2020-07-08 DIAGNOSIS — I5022 Chronic systolic (congestive) heart failure: Secondary | ICD-10-CM

## 2020-07-08 DIAGNOSIS — I480 Paroxysmal atrial fibrillation: Secondary | ICD-10-CM

## 2020-07-08 DIAGNOSIS — I428 Other cardiomyopathies: Secondary | ICD-10-CM | POA: Diagnosis not present

## 2020-07-08 DIAGNOSIS — I10 Essential (primary) hypertension: Secondary | ICD-10-CM

## 2020-07-08 DIAGNOSIS — R0789 Other chest pain: Secondary | ICD-10-CM | POA: Diagnosis not present

## 2020-07-08 DIAGNOSIS — Z950 Presence of cardiac pacemaker: Secondary | ICD-10-CM | POA: Diagnosis not present

## 2020-07-08 NOTE — Patient Instructions (Addendum)
Medication Instructions:   Your physician recommends that you continue on your current medications as directed. Please refer to the Current Medication list given to you today.  *If you need a refill on your cardiac medications before your next appointment, please call your pharmacy*   Lab Work: NONE ORDERED  TODAY   If you have labs (blood work) drawn today and your tests are completely normal, you will receive your results only by: . MyChart Message (if you have MyChart) OR . A paper copy in the mail If you have any lab test that is abnormal or we need to change your treatment, we will call you to review the results.   Testing/Procedures: NONE ORDERED  TODAY    Follow-Up: At [facility], you and your health needs are our priority.  As part of our continuing mission to provide you with exceptional heart care, we have created designated Provider Care Teams.  These Care Teams include your primary Cardiologist (physician) and Advanced Practice Providers (APPs -  Physician Assistants and Nurse Practitioners) who all work together to provide you with the care you need, when you need it.  We recommend signing up for the patient portal called "MyChart".  Sign up information is provided on this After Visit Summary.  MyChart is used to connect with patients for Virtual Visits (Telemedicine).  Patients are able to view lab/test results, encounter notes, upcoming appointments, etc.  Non-urgent messages can be sent to your provider as well.   To learn more about what you can do with MyChart, go to [email or web address].    Your next appointment:   1 year(s)  The format for your next appointment:   In Person  Provider:   You may see Dr. Allred or one of the following Advanced Practice Providers on your designated Care Team:    Renee Ursuy, PA-C     Other Instructions   

## 2020-07-09 NOTE — Telephone Encounter (Signed)
Patient had office visit with Francis Dowse, PA on 07/08/2020 and no device clinic appointment needed.  Message sent to [facility] scheduler that no appointment is needed for device clinic.

## 2020-07-22 ENCOUNTER — Ambulatory Visit (INDEPENDENT_AMBULATORY_CARE_PROVIDER_SITE_OTHER): Payer: No Typology Code available for payment source

## 2020-07-22 DIAGNOSIS — Z950 Presence of cardiac pacemaker: Secondary | ICD-10-CM | POA: Diagnosis not present

## 2020-07-22 DIAGNOSIS — I5022 Chronic systolic (congestive) heart failure: Secondary | ICD-10-CM | POA: Diagnosis not present

## 2020-07-23 NOTE — Progress Notes (Signed)
EPIC Encounter for ICM Monitoring  Patient Name: Alexander Curtis is a 75 y.o. male Date: 07/23/2020 Primary Care Physican: Rita Ohara, MD Primary Cardiologist:Ross Electrophysiologist: Allred Bi-V Pacing: 98% 1/11/2022Weight:209lbs   Spoke with patient and reports feeling well at this time.  Denies fluid symptoms.    Corvue thoracic impedancenormal.   Prescribed:No diuretic  Recommendations: No changes and encouraged to call if experiencing any fluid symptoms.  Follow-up plan: ICM clinic phone appointment on2/15/2022. 91 day device clinic remote transmission3/02/2021.  EP/Cardiology Office Visits:08/20/2020 with Dr.Ross. Recall for 12/21/2020 with DrAllred.   Copy of ICM check sent to Dr.Allred.  3 month ICM trend: 07/22/2020.    1 Year ICM trend:       [name], RN 07/23/2020 10:29 AM

## 2020-08-06 ENCOUNTER — Ambulatory Visit: Payer: No Typology Code available for payment source | Admitting: Internal Medicine

## 2020-08-20 ENCOUNTER — Encounter: Payer: Self-pay | Admitting: Internal Medicine

## 2020-08-20 ENCOUNTER — Ambulatory Visit (INDEPENDENT_AMBULATORY_CARE_PROVIDER_SITE_OTHER): Payer: No Typology Code available for payment source | Admitting: Internal Medicine

## 2020-08-20 ENCOUNTER — Other Ambulatory Visit: Payer: Self-pay

## 2020-08-20 VITALS — BP 122/60 | HR 77 | Ht 73.0 in | Wt 221.8 lb

## 2020-08-20 DIAGNOSIS — I428 Other cardiomyopathies: Secondary | ICD-10-CM | POA: Diagnosis not present

## 2020-08-20 NOTE — Patient Instructions (Signed)
Medication Instructions:  No changes *If you need a refill on your cardiac medications before your next appointment, please call your pharmacy*   Lab Work: none If you have labs (blood work) drawn today and your tests are completely normal, you will receive your results only by: MyChart Message (if you have MyChart) OR A paper copy in the mail If you have any lab test that is abnormal or we need to change your treatment, we will call you to review the results.   Testing/Procedures: none   Follow-Up: At [facility], you and your health needs are our priority.  As part of our continuing mission to provide you with exceptional heart care, we have created designated Provider Care Teams.  These Care Teams include your primary Cardiologist (physician) and Advanced Practice Providers (APPs -  Physician Assistants and Nurse Practitioners) who all work together to provide you with the care you need, when you need it.  Your next appointment:   9 month(s)  The format for your next appointment:   In Person  Provider:   You may see Paula Ross, MD or one of the following Advanced Practice Providers on your designated Care Team:   Safi Weaver, PA-C Vin Bhagat, PA-C   Other Instructions   

## 2020-08-20 NOTE — Progress Notes (Signed)
Cardiology Office Note   Date:  08/20/2020   ID:  Alexander Curtis, Alexander Curtis 06-16-1946, MRN [id number]  PCP:  Rita Ohara, MD  Cardiologist:   Dorris Carnes, MD   Pt presents for follow up of NICM      History of Present Illness: Alexander Curtis is a 75 y.o. male with a history of high degree AV block   S/P PPM 2011  Echo I n 2016 LVEF 35 to 40%   Upgraded to BiV pacer.  Echo repeated in 2017 and LVEF 45 to 50%    The pt is also followed by EP   I saw the pt in clinci back in Feb 2021  Since seen he says his breathing has been good   He remains very active  Denies CP     WOuld like to lose a little wt   Breakfast:   Oatmeal Lunch:  Soup or cheese sandwich or tuna fish Dinner:  Chicken or steak and veg    Nonsugar drinks       Current Meds  Medication Sig   aspirin 81 MG tablet Take 81 mg by mouth every other day.   atorvastatin (LIPITOR) 40 MG tablet Take 40 mg by mouth daily.   carvedilol (COREG) 6.25 MG tablet TAKE 1 TABLET(6.25 MG) BY MOUTH TWICE DAILY WITH A MEAL   CINNAMON PO Take 2,000 mg by mouth 3 (three) times daily.    Coenzyme Q10 (COQ10) 200 MG CAPS Take 1 capsule by mouth 2 (two) times daily.    diphenhydrAMINE (BENADRYL) 50 MG tablet Take 50 mg by mouth at bedtime as needed for itching.   FIBER SELECT GUMMIES PO Take 1 each by mouth daily.   fish oil-omega-3 fatty acids 1000 MG capsule Take 1 g by mouth 3 (three) times daily.    gabapentin (NEURONTIN) 100 MG capsule Take 100 mg by mouth 2 (two) times daily.   lisinopril (PRINIVIL,ZESTRIL) 20 MG tablet Take 20 mg by mouth daily.   metFORMIN (GLUCOPHAGE) 500 MG tablet Take 500 mg by mouth daily with breakfast.   Multiple Vitamins-Minerals (MENS 50+ MULTI VITAMIN/MIN PO) Take 1 tablet by mouth daily.   TURMERIC PO Take 1 tablet by mouth daily.     Allergies:   Horse-derived products, Pravastatin, and Adhesive [tape]   Past Medical History:  Diagnosis Date   Arthritis of shoulder    right (CT done 11/2018)    Atrial enlargement, left    Bradycardia 06/30/2014   Diabetes mellitus, type 2 ([place])    Diabetic peripheral neuropathy ([place]) 01/24/2017   Diagnosed by [facility], and prescribed gabapentin   HLD (hyperlipidemia)    Hypertension 05/2013   LBBB (left bundle branch block)    Melanoma ([place]) 1968   R arm   Mitral regurgitation    Mobitz type 2 second degree heart block    a. s/p Medtronic Adapta L model ADDRL 1 (serial number [id number]) pacemaker. 07/02/14 b.  upgrade to STJ CRTP 02/2015   Non-ischemic cardiomyopathy ([place])    a. felt to be 2/2 RV pacing   Obstructive sleep apnea    compliant with CPAP   Pacemaker    a. Medtronic Adapta L model ADDRL 1 (serial number [id number]) pacemaker.    Pulmonary nodules    noted on CT of shoulder 03/2016, up to 22mm in size on CT chest 04/2016. repeat 1 year due to h/o melanoma   Syncope 2011   a. 2011 -negative workup except  LBBB. thought to be vasovagal   Tricuspid regurgitation     Past Surgical History:  Procedure Laterality Date   EP IMPLANTABLE DEVICE N/A 03/01/2015   STJ CRTP upgrade by Dr Rayann Heman   FINGER TENDON REPAIR     R 2nd and 3rd finger   INGUINAL HERNIA REPAIR     bilateral   KNEE ARTHROSCOPY  02/2011   L knee for meniscal tear. [name]   PERMANENT PACEMAKER INSERTION N/A 07/01/2014   MDT ADDRL1 pacemaker implanted for Mobitz II Dr Rayann Heman   UVULECTOMY     laser treatment for sleep apnea (NOT UP3)   VASECTOMY       Social History:  The patient  reports that he has never smoked. He has never used smokeless tobacco. He reports current alcohol use. He reports that he does not use drugs.   Family History:  The patient's family history includes Cancer in his mother; Dementia in his mother and paternal aunt; Diabetes in his maternal grandmother and sister; Heart attack in his maternal grandfather; Heart disease in his maternal grandfather; Melanoma (age of onset: 46) in his mother; Parkinson's disease in  his maternal aunt; Stroke (age of onset: 57) in his father; Stroke (age of onset: 66) in his sister.    ROS:  Please see the history of present illness. All other systems are reviewed and  Negative to the above problem except as noted.    PHYSICAL EXAM: VS:  BP 122/60    Pulse 77    Ht 6\' 1"  (1.854 m)    Wt 221 lb 12.8 oz (100.6 kg)    SpO2 94%    BMI 29.26 kg/m   GEN: Well nourished, well developed, in no acute distress  HEENT: normal  Neck: JVP normal  No carotid bruits  Cardiac: RRR; no murmurs.  NO LE edema  Respiratory:  clear to auscultation bilaterally, GI: soft, nontender, nondistended, + BS  No hepatomegaly  MS: no deformity Moving all extremities   Skin: warm and dry, no rash  EKG:  EKG is not ordered today.   Lipid Panel    Component Value Date/Time   CHOL 127 09/02/2015 0001   TRIG 65 09/02/2015 0001   HDL 40 09/02/2015 0001   CHOLHDL 3.2 09/02/2015 0001   VLDL 13 09/02/2015 0001   LDLCALC 74 09/02/2015 0001      Wt Readings from Last 3 Encounters:  08/20/20 221 lb 12.8 oz (100.6 kg)  07/08/20 218 lb (98.9 kg)  12/27/19 215 lb (97.5 kg)      ASSESSMENT AND PLAN:  1  Chronic systolic CHF Pt with BiV device   Mild LV dysfucntion on last echo   Volume looks good      2  AV block  Initially s/p PPM  Then upgrade to BiV PPM  Follows with J Allred  3   PAF  One episode in Aug   WIll review with EP for other recurrencecs   Denies palpitations   4   HL  Pt folllows at [place]   WIll have labs there     5  Diet   Discussed carbs, TRE     WIll be starting program at [place] ([facility])   F/U in 9 months   Current medicines are reviewed at length with the patient today.  The patient does not have concerns regarding medicines.  Signed, Dorris Carnes, MD  08/20/2020 10:03 AM    [address],  [place], [place]  [address] Phone: [phone]; Fax: [phone]

## 2020-08-21 ENCOUNTER — Telehealth: Payer: Self-pay | Admitting: Internal Medicine

## 2020-08-21 NOTE — Telephone Encounter (Signed)
Will forward note to [place] when it is complete.

## 2020-08-21 NOTE — Telephone Encounter (Signed)
Ronny Bacon from [facility] called to request the latest OV note from the patient's appointment with Dr. Harrington Challenger 08/20/20. Please fax note to [phone] Attn: Ronny Bacon

## 2020-08-27 ENCOUNTER — Ambulatory Visit (INDEPENDENT_AMBULATORY_CARE_PROVIDER_SITE_OTHER): Payer: No Typology Code available for payment source

## 2020-08-27 DIAGNOSIS — Z950 Presence of cardiac pacemaker: Secondary | ICD-10-CM

## 2020-08-27 DIAGNOSIS — I5022 Chronic systolic (congestive) heart failure: Secondary | ICD-10-CM

## 2020-08-27 NOTE — Telephone Encounter (Signed)
OV note faxed to [place] at [place] at [phone]

## 2020-09-03 NOTE — Progress Notes (Signed)
EPIC Encounter for ICM Monitoring  Patient Name: Alexander Curtis is a 75 y.o. male Date: 09/03/2020 Primary Care Physican: Rita Ohara, MD Primary Cardiologist:Ross Electrophysiologist: Allred Bi-V Pacing: 98% 08/20/2020 OfficeWeight:221lbs   Transmission reviewed.  Corvue thoracic impedancenormal.   Prescribed:No diuretic  Recommendations: No changes.  Follow-up plan: ICM clinic phone appointment on3/21/2022. 91 day device clinic remote transmission3/02/2021.  EP/Cardiology Office Visits: Recall 11/5/2022with Dr.Ross. Recall for 07/08/2021 with DrAllred.   Copy of ICM check sent to Dr.Allred.  3 month ICM trend: 08/27/2020.    1 Year ICM trend:       [name], RN 09/03/2020 3:35 PM

## 2020-09-17 ENCOUNTER — Ambulatory Visit (INDEPENDENT_AMBULATORY_CARE_PROVIDER_SITE_OTHER): Payer: No Typology Code available for payment source

## 2020-09-17 DIAGNOSIS — I441 Atrioventricular block, second degree: Secondary | ICD-10-CM | POA: Diagnosis not present

## 2020-09-19 LAB — CUP PACEART REMOTE DEVICE CHECK
Battery Remaining Longevity: 42 mo
Battery Remaining Percentage: 50 %
Battery Voltage: 2.87 V
Brady Statistic AP VP Percent: 31 %
Brady Statistic AP VS Percent: 1 %
Brady Statistic AS VP Percent: 67 %
Brady Statistic AS VS Percent: 1.1 %
Brady Statistic RA Percent Paced: 32 %
Date Time Interrogation Session: 20220309202130
Implantable Lead Implant Date: 20151220
Implantable Lead Implant Date: 20151220
Implantable Lead Implant Date: 20160819
Implantable Lead Location: 753858
Implantable Lead Location: 753859
Implantable Lead Location: 753860
Implantable Lead Model: 5076
Implantable Lead Model: 5076
Implantable Pulse Generator Implant Date: 20160819
Lead Channel Impedance Value: 390 Ohm
Lead Channel Impedance Value: 430 Ohm
Lead Channel Impedance Value: 440 Ohm
Lead Channel Pacing Threshold Amplitude: 0.5 V
Lead Channel Pacing Threshold Amplitude: 0.5 V
Lead Channel Pacing Threshold Amplitude: 1.125 V
Lead Channel Pacing Threshold Pulse Width: 0.5 ms
Lead Channel Pacing Threshold Pulse Width: 0.5 ms
Lead Channel Pacing Threshold Pulse Width: 0.8 ms
Lead Channel Sensing Intrinsic Amplitude: 1.3 mV
Lead Channel Sensing Intrinsic Amplitude: 3.5 mV
Lead Channel Setting Pacing Amplitude: 1.5 V
Lead Channel Setting Pacing Amplitude: 2 V
Lead Channel Setting Pacing Amplitude: 2.125
Lead Channel Setting Pacing Pulse Width: 0.5 ms
Lead Channel Setting Pacing Pulse Width: 0.8 ms
Lead Channel Setting Sensing Sensitivity: 0.7 mV
Pulse Gen Model: 3262
Pulse Gen Serial Number: 3099689

## 2020-09-20 ENCOUNTER — Telehealth: Payer: Self-pay

## 2020-09-20 NOTE — Telephone Encounter (Signed)
Attempted return call to patient per voice mail request regarding he received a call from Dr Jackalyn Lombard office asking him to send a remote transmission.  He asked if remote transmission was received.  Left message stating report was received and suggesting normal fluid levels.  Advised if he has any further questions to call back and next ICM remote transmission scheduled for 09/30/2020.

## 2020-09-26 NOTE — Progress Notes (Signed)
Remote pacemaker transmission.   

## 2020-09-30 ENCOUNTER — Ambulatory Visit (INDEPENDENT_AMBULATORY_CARE_PROVIDER_SITE_OTHER): Payer: No Typology Code available for payment source

## 2020-09-30 DIAGNOSIS — Z950 Presence of cardiac pacemaker: Secondary | ICD-10-CM

## 2020-09-30 DIAGNOSIS — I5022 Chronic systolic (congestive) heart failure: Secondary | ICD-10-CM | POA: Diagnosis not present

## 2020-10-02 NOTE — Progress Notes (Signed)
EPIC Encounter for ICM Monitoring  Patient Name: Alexander Curtis is a 75 y.o. male Date: 10/02/2020 Primary Care Physican: Rita Ohara, MD Primary Cardiologist:Ross Electrophysiologist: Allred Bi-V Pacing: 98% 08/20/2020 OfficeWeight:221lbs   Spoke with patient and reports feeling well at this time.  Denies fluid symptoms.  They will be going on [place] on 6/9 - 6/19.    Going to river cruise on 7[place]27[place].  Corvue thoracic impedancenormal.   Prescribed:No diuretic  Recommendations: No changes and encouraged to call if experiencing any fluid symptoms.  Follow-up plan: ICM clinic phone appointment on4/25/2022. 91 day device clinic remote transmission6/01/2021.  EP/Cardiology Office Visits: Recall 11/5/2022with Dr.Ross. Recall for 07/08/2021 with DrAllred.   Copy of ICM check sent to Dr.Allred.   3 month ICM trend: 09/30/2020.    1 Year ICM trend:       [name], RN 10/02/2020 8:22 AM

## 2020-11-04 ENCOUNTER — Ambulatory Visit (INDEPENDENT_AMBULATORY_CARE_PROVIDER_SITE_OTHER): Payer: No Typology Code available for payment source

## 2020-11-04 DIAGNOSIS — I5022 Chronic systolic (congestive) heart failure: Secondary | ICD-10-CM

## 2020-11-04 DIAGNOSIS — Z950 Presence of cardiac pacemaker: Secondary | ICD-10-CM | POA: Diagnosis not present

## 2020-11-06 NOTE — Progress Notes (Signed)
EPIC Encounter for ICM Monitoring  Patient Name: Alexander Curtis is a 75 y.o. male Date: 11/06/2020 Primary Care Physican: Rita Ohara, MD Primary Cardiologist:Ross Electrophysiologist: Allred Bi-V Pacing: 98% 4/27/2022OfficeWeight:221lbs   Spoke with patient and reports feeling well at this time.  Denies fluid symptoms.   They will be going on [place] on 6/9 - 6/19.  Going to river cruise on 7/27-[place].  Corvue thoracic impedancenormal.   Prescribed:No diuretic  Recommendations: No changes and encouraged to call if experiencing any fluid symptoms.  Follow-up plan: ICM clinic phone appointment on5/31/2022. 91 day device clinic remote transmission6/01/2021.  EP/Cardiology Office Visits: Recall 11/5/2022with Dr.Ross. Recall for12/27/2022 with DrAllred.   Copy of ICM check sent to Dr.Allred.   3 month ICM trend: 11/04/2020.    1 Year ICM trend:       [name], RN 11/06/2020 8:16 AM

## 2020-12-10 ENCOUNTER — Ambulatory Visit (INDEPENDENT_AMBULATORY_CARE_PROVIDER_SITE_OTHER): Payer: No Typology Code available for payment source

## 2020-12-10 DIAGNOSIS — Z950 Presence of cardiac pacemaker: Secondary | ICD-10-CM

## 2020-12-10 DIAGNOSIS — I5022 Chronic systolic (congestive) heart failure: Secondary | ICD-10-CM | POA: Diagnosis not present

## 2020-12-13 ENCOUNTER — Telehealth: Payer: Self-pay

## 2020-12-13 NOTE — Progress Notes (Signed)
Patient returned call.  He said he is doing well and scheduled for cruise last week.  His son and daughter in law has [place] but he has not been around them.  Transmission reviewed.

## 2020-12-13 NOTE — Progress Notes (Signed)
EPIC Encounter for ICM Monitoring  Patient Name: Alexander Curtis is a 75 y.o. male Date: 12/13/2020 Primary Care Physican: Rita Ohara, MD Primary Cardiologist:Ross Electrophysiologist: Allred Bi-V Pacing: 98% 4/27/2022OfficeWeight:221lbs   Attempted call to patient and unable to reach.  Left detailed message per DPR regarding transmission. Transmission reviewed.  Scheduled for [employer] on 6/9 - 6/19 and river cruise on 7/27-[place].  Corvue thoracic impedancenormal.   Prescribed:No diuretic  Recommendations: Left voice mail with ICM number and encouraged to call if experiencing any fluid symptoms.  Follow-up plan: ICM clinic phone appointment on7/11/2020. 91 day device clinic remote transmission6/01/2021.  EP/Cardiology Office Visits: Recall 11/5/2022with Dr.Ross. Recall for12/27/2022 with DrAllred.   Copy of ICM check sent to Dr.Allred.   3 month ICM trend: 12/10/2020.    1 Year ICM trend:       [name], RN 12/13/2020 3:22 PM

## 2020-12-13 NOTE — Telephone Encounter (Signed)
Remote ICM transmission received.  Attempted call to patient regarding ICM remote transmission and left detailed message per DPR.  Advised to return call for any fluid symptoms or questions. Next ICM remote transmission scheduled 01/14/2021.

## 2020-12-17 ENCOUNTER — Ambulatory Visit (INDEPENDENT_AMBULATORY_CARE_PROVIDER_SITE_OTHER): Payer: No Typology Code available for payment source

## 2020-12-17 DIAGNOSIS — I441 Atrioventricular block, second degree: Secondary | ICD-10-CM

## 2020-12-17 LAB — CUP PACEART REMOTE DEVICE CHECK
Battery Remaining Longevity: 31 mo
Battery Remaining Percentage: 38 %
Battery Voltage: 2.84 V
Brady Statistic AP VP Percent: 28 %
Brady Statistic AP VS Percent: 1 %
Brady Statistic AS VP Percent: 70 %
Brady Statistic AS VS Percent: 1.1 %
Brady Statistic RA Percent Paced: 29 %
Date Time Interrogation Session: 20220607020027
Implantable Lead Implant Date: 20151220
Implantable Lead Implant Date: 20151220
Implantable Lead Implant Date: 20160819
Implantable Lead Location: 753858
Implantable Lead Location: 753859
Implantable Lead Location: 753860
Implantable Lead Model: 5076
Implantable Lead Model: 5076
Implantable Pulse Generator Implant Date: 20160819
Lead Channel Impedance Value: 390 Ohm
Lead Channel Impedance Value: 410 Ohm
Lead Channel Impedance Value: 430 Ohm
Lead Channel Pacing Threshold Amplitude: 0.5 V
Lead Channel Pacing Threshold Amplitude: 0.75 V
Lead Channel Pacing Threshold Amplitude: 1.125 V
Lead Channel Pacing Threshold Pulse Width: 0.5 ms
Lead Channel Pacing Threshold Pulse Width: 0.5 ms
Lead Channel Pacing Threshold Pulse Width: 0.8 ms
Lead Channel Sensing Intrinsic Amplitude: 1.8 mV
Lead Channel Sensing Intrinsic Amplitude: 3.1 mV
Lead Channel Setting Pacing Amplitude: 1.5 V
Lead Channel Setting Pacing Amplitude: 2 V
Lead Channel Setting Pacing Amplitude: 2.125
Lead Channel Setting Pacing Pulse Width: 0.5 ms
Lead Channel Setting Pacing Pulse Width: 0.8 ms
Lead Channel Setting Sensing Sensitivity: 0.7 mV
Pulse Gen Model: 3262
Pulse Gen Serial Number: 3099689

## 2020-12-18 DIAGNOSIS — Z20822 Contact with and (suspected) exposure to covid-19: Secondary | ICD-10-CM | POA: Diagnosis not present

## 2021-01-02 ENCOUNTER — Ambulatory Visit: Payer: Medicare HMO | Admitting: Family Medicine

## 2021-01-08 NOTE — Progress Notes (Signed)
Remote ICD transmission.   

## 2021-01-14 ENCOUNTER — Ambulatory Visit (INDEPENDENT_AMBULATORY_CARE_PROVIDER_SITE_OTHER): Payer: No Typology Code available for payment source

## 2021-01-14 DIAGNOSIS — I5022 Chronic systolic (congestive) heart failure: Secondary | ICD-10-CM | POA: Diagnosis not present

## 2021-01-14 DIAGNOSIS — Z950 Presence of cardiac pacemaker: Secondary | ICD-10-CM | POA: Diagnosis not present

## 2021-01-15 NOTE — Progress Notes (Signed)
EPIC Encounter for ICM Monitoring  Patient Name: Alexander Curtis is a 75 y.o. male Date: 01/15/2021 Primary Care Physican: Rita Ohara, MD Primary Cardiologist: Harrington Challenger Electrophysiologist: Allred Bi-V Pacing:  98%      01/15/2021 Weight: 215 lbs      Spoke with patient and he tested positive for COVID after returning from [place] from 6/9 - 6/19.   They are scheduled for river cruise on 7/15-8/3 [place].  Wife has COVID symptoms but has not tested positive for COVID.   Corvue thoracic impedance normal but was suggesting possible fluid accumulation from 6/3-6/20 during vacation.   Prescribed: No diuretic   Recommendations:  He is still recovering from [place] and feeling better.  Encouraged to limit salt intake.     Follow-up plan: ICM clinic phone appointment on 02/24/2021.   91 day device clinic remote transmission 03/18/2021.   EP/Cardiology Office Visits:  Recall 05/17/2021 with Dr. Harrington Challenger.  Recall for 07/08/2021 with Dr Rayann Heman.     Copy of ICM check sent to Dr. Rayann Heman.    3 month ICM trend: 01/14/2021.    1 Year ICM trend:       [name], RN 01/15/2021 12:55 PM

## 2021-01-23 ENCOUNTER — Telehealth: Payer: Self-pay | Admitting: Family Medicine

## 2021-01-23 NOTE — Telephone Encounter (Signed)
Pt came by and dropped off labs from [place]. Sending to Dr. Tomi Bamberger for review.

## 2021-02-24 ENCOUNTER — Ambulatory Visit (INDEPENDENT_AMBULATORY_CARE_PROVIDER_SITE_OTHER): Payer: Medicare HMO

## 2021-02-24 DIAGNOSIS — I5022 Chronic systolic (congestive) heart failure: Secondary | ICD-10-CM

## 2021-02-24 DIAGNOSIS — Z950 Presence of cardiac pacemaker: Secondary | ICD-10-CM

## 2021-02-26 NOTE — Progress Notes (Signed)
EPIC Encounter for ICM Monitoring  Patient Name: Alexander Curtis is a 75 y.o. male Date: 02/26/2021 Primary Care Physican: Rita Ohara, MD Primary Cardiologist: Harrington Challenger Electrophysiologist: Allred Bi-V Pacing:  98%      02/26/2021 Weight: 213.6 lbs      Spoke with patient and heart failure questions reviewed.  Pt asymptomatic for fluid accumulation and feeling well.  Walking 2 miles everyday for exercise.   Corvue thoracic impedance normal.   Prescribed: No diuretic   Recommendations:  No changes and encouraged to call if experiencing any fluid symptoms.   Follow-up plan: ICM clinic phone appointment on 03/31/2021.   91 day device clinic remote transmission 03/18/2021.   EP/Cardiology Office Visits:  Recall 05/17/2021 with Dr. Harrington Challenger.  Recall for 07/08/2021 with Dr Rayann Heman.     Copy of ICM check sent to Dr. Rayann Heman.     3 month ICM trend: 02/24/2021.    1 Year ICM trend:       [name], RN 02/26/2021 10:05 AM

## 2021-03-18 ENCOUNTER — Ambulatory Visit (INDEPENDENT_AMBULATORY_CARE_PROVIDER_SITE_OTHER): Payer: Medicare HMO

## 2021-03-18 DIAGNOSIS — I428 Other cardiomyopathies: Secondary | ICD-10-CM | POA: Diagnosis not present

## 2021-03-18 LAB — CUP PACEART REMOTE DEVICE CHECK
Battery Remaining Longevity: 8 mo
Battery Remaining Percentage: 9 %
Battery Voltage: 2.84 V
Brady Statistic AP VP Percent: 26 %
Brady Statistic AP VS Percent: 1 %
Brady Statistic AS VP Percent: 72 %
Brady Statistic AS VS Percent: 1.1 %
Brady Statistic RA Percent Paced: 26 %
Date Time Interrogation Session: 20220906020014
Implantable Lead Implant Date: 20151220
Implantable Lead Implant Date: 20151220
Implantable Lead Implant Date: 20160819
Implantable Lead Location: 753858
Implantable Lead Location: 753859
Implantable Lead Location: 753860
Implantable Lead Model: 5076
Implantable Lead Model: 5076
Implantable Pulse Generator Implant Date: 20160819
Lead Channel Impedance Value: 390 Ohm
Lead Channel Impedance Value: 400 Ohm
Lead Channel Impedance Value: 410 Ohm
Lead Channel Pacing Threshold Amplitude: 0.625 V
Lead Channel Pacing Threshold Amplitude: 1 V
Lead Channel Pacing Threshold Amplitude: 1.25 V
Lead Channel Pacing Threshold Pulse Width: 0.5 ms
Lead Channel Pacing Threshold Pulse Width: 0.5 ms
Lead Channel Pacing Threshold Pulse Width: 0.8 ms
Lead Channel Sensing Intrinsic Amplitude: 1.3 mV
Lead Channel Sensing Intrinsic Amplitude: 3.1 mV
Lead Channel Setting Pacing Amplitude: 2 V
Lead Channel Setting Pacing Amplitude: 2 V
Lead Channel Setting Pacing Amplitude: 2.25 V
Lead Channel Setting Pacing Pulse Width: 0.5 ms
Lead Channel Setting Pacing Pulse Width: 0.8 ms
Lead Channel Setting Sensing Sensitivity: 0.7 mV
Pulse Gen Model: 3262
Pulse Gen Serial Number: 3099689

## 2021-03-27 NOTE — Progress Notes (Signed)
Remote ICD transmission.   

## 2021-04-02 NOTE — Progress Notes (Signed)
No ICM remote transmission received for 03/31/2021 and next ICM transmission scheduled for 04/28/2021.

## 2021-04-28 ENCOUNTER — Ambulatory Visit (INDEPENDENT_AMBULATORY_CARE_PROVIDER_SITE_OTHER): Payer: Medicare HMO

## 2021-04-28 DIAGNOSIS — I5022 Chronic systolic (congestive) heart failure: Secondary | ICD-10-CM | POA: Diagnosis not present

## 2021-04-28 DIAGNOSIS — Z950 Presence of cardiac pacemaker: Secondary | ICD-10-CM

## 2021-04-30 ENCOUNTER — Telehealth: Payer: Self-pay

## 2021-04-30 NOTE — Telephone Encounter (Signed)
Remote ICM transmission received.  Attempted call to patient regarding ICM remote transmission and left message to return call   

## 2021-04-30 NOTE — Telephone Encounter (Signed)
Opened in error

## 2021-04-30 NOTE — Progress Notes (Signed)
EPIC Encounter for ICM Monitoring  Patient Name: Alexander Curtis is a 75 y.o. male Date: 04/30/2021 Primary Care Physican: Rita Ohara, MD Primary Cardiologist: Harrington Challenger Electrophysiologist: Allred Bi-V Pacing:  98%      02/26/2021 Weight: 213.6 lbs      Attempted call to patient and unable to reach.  Left message to return call. Transmission reviewed.    Corvue thoracic impedance normal.   Prescribed: No diuretic   Recommendations:  Unable to reach.     Follow-up plan: ICM clinic phone appointment on 06/02/2021.   91 day device clinic remote transmission 06/17/2021.   EP/Cardiology Office Visits:  06/19/2021 with Dr. Harrington Challenger.  Recall for 07/08/2021 with Dr Rayann Heman.     Copy of ICM check sent to Dr. Rayann Heman.      3 month ICM trend: 04/27/2021.     [name], RN 04/30/2021 3:15 PM

## 2021-05-07 ENCOUNTER — Encounter: Payer: Self-pay | Admitting: *Deleted

## 2021-05-07 NOTE — Telephone Encounter (Signed)
Patient's husband called and wanted to let you know as patient's spouse he has noticed and is concerned about a decline in short term memory. This is a very difficult subject to approach with patient, it irritates her and he just wanted you to know prior to her AWV tomorrow.

## 2021-05-07 NOTE — Telephone Encounter (Signed)
This encounter was created in error - please disregard.

## 2021-06-02 ENCOUNTER — Ambulatory Visit (INDEPENDENT_AMBULATORY_CARE_PROVIDER_SITE_OTHER): Payer: Medicare HMO

## 2021-06-02 DIAGNOSIS — Z950 Presence of cardiac pacemaker: Secondary | ICD-10-CM | POA: Diagnosis not present

## 2021-06-02 DIAGNOSIS — I5022 Chronic systolic (congestive) heart failure: Secondary | ICD-10-CM

## 2021-06-03 ENCOUNTER — Telehealth: Payer: Self-pay

## 2021-06-03 NOTE — Progress Notes (Signed)
EPIC Encounter for ICM Monitoring  Patient Name: Alexander Curtis is a 75 y.o. male Date: 06/03/2021 Primary Care Physican: Rita Ohara, MD Primary Cardiologist: Harrington Challenger Electrophysiologist: Allred Bi-V Pacing:  98%      02/26/2021 Weight: 213.6 lbs      Attempted call to patient and unable to reach.  Left message to return call. Transmission reviewed.    Corvue thoracic impedance normal.   Prescribed: No diuretic   Recommendations:  Unable to reach.     Follow-up plan: ICM clinic phone appointment on 07/15/2021.   91 day device clinic remote transmission 06/17/2021.   EP/Cardiology Office Visits:  06/19/2021 with Dr. Harrington Challenger.  06/30/2021 with Dr Rayann Heman.     Copy of ICM check sent to Dr. Rayann Heman.     3 month ICM trend: 06/02/2021.    12-14 Month ICM trend:       [name], RN 06/03/2021 4:09 PM

## 2021-06-03 NOTE — Telephone Encounter (Signed)
Remote ICM transmission received.  Attempted call to patient on home and cell phone numbe regarding ICM remote transmission and left detailed message per DPR.

## 2021-06-17 ENCOUNTER — Ambulatory Visit (INDEPENDENT_AMBULATORY_CARE_PROVIDER_SITE_OTHER): Payer: Medicare HMO

## 2021-06-17 DIAGNOSIS — I441 Atrioventricular block, second degree: Secondary | ICD-10-CM

## 2021-06-17 NOTE — Progress Notes (Signed)
Cardiology Office Note   Date:  06/19/2021   ID:  Alexander, Curtis [birth year], MRN [id number]  PCP:  Rita Ohara, MD  Cardiologist:   Dorris Carnes, MD   Pt presents for follow up of NICM      History of Present Illness: Alexander Curtis is a 75 y.o. male with a history of high degree AV block   S/P PPM 2011  Echo I n 2016 LVEF 35 to 40%   Upgraded to BiV pacer.  Echo repeated in 2017 and LVEF 45 to 50%    The pt is also followed by EP   I last saw the pt in Feb 2022    Since seen the pt says he has been doing OK   He denies CP   Breathing is OK   No LE edema     Current Meds  Medication Sig   aspirin 81 MG tablet Take 81 mg by mouth every other day.   carvedilol (COREG) 6.25 MG tablet TAKE 1 TABLET(6.25 MG) BY MOUTH TWICE DAILY WITH A MEAL   CINNAMON PO Take 2,000 mg by mouth 3 (three) times daily.    Coenzyme Q10 (COQ10) 200 MG CAPS Take 1 capsule by mouth 2 (two) times daily.    diphenhydrAMINE (BENADRYL) 50 MG tablet Take 50 mg by mouth at bedtime as needed for itching.   FIBER SELECT GUMMIES PO Take 1 each by mouth daily.   fish oil-omega-3 fatty acids 1000 MG capsule Take 1 g by mouth 3 (three) times daily.    gabapentin (NEURONTIN) 100 MG capsule Take 100 mg by mouth 2 (two) times daily.   lisinopril (PRINIVIL,ZESTRIL) 20 MG tablet Take 20 mg by mouth daily.   metFORMIN (GLUCOPHAGE) 500 MG tablet Take 500 mg by mouth daily with breakfast.   Multiple Vitamins-Minerals (MENS 50+ MULTI VITAMIN/MIN PO) Take 1 tablet by mouth daily.   simvastatin (ZOCOR) 10 MG tablet Take 1 tablet by mouth at bedtime.   TURMERIC PO Take 1 tablet by mouth daily.   [DISCONTINUED] atorvastatin (LIPITOR) 40 MG tablet Take 40 mg by mouth daily.     Allergies:   Horse-derived products, Pravastatin, and Adhesive [tape]   Past Medical History:  Diagnosis Date   Arthritis of shoulder    right (CT done 11/2018)   Atrial enlargement, left    Bradycardia 06/30/2014   Diabetes mellitus, type 2 ([place])     Diabetic peripheral neuropathy ([place]) 01/24/2017   Diagnosed by [place], and prescribed gabapentin   HLD (hyperlipidemia)    Hypertension 05/2013   LBBB (left bundle branch block)    Melanoma ([place]) 1968   R arm   Mitral regurgitation    Mobitz type 2 second degree heart block    a. s/p Medtronic Adapta L model ADDRL 1 (serial number [id number]) pacemaker. 07/02/14 b.  upgrade to STJ CRTP 02/2015   Non-ischemic cardiomyopathy ([place])    a. felt to be 2/2 RV pacing   Obstructive sleep apnea    compliant with CPAP   Pacemaker    a. Medtronic Adapta L model ADDRL 1 (serial number [id number]) pacemaker.    Pulmonary nodules    noted on CT of shoulder 03/2016, up to 99mm in size on CT chest 04/2016. repeat 1 year due to h/o melanoma   Syncope 2011   a. 2011 -negative workup except LBBB. thought to be vasovagal   Tricuspid regurgitation     Past Surgical History:  Procedure  Laterality Date   EP IMPLANTABLE DEVICE N/A 03/01/2015   STJ CRTP upgrade by Dr Rayann Heman   FINGER TENDON REPAIR     R 2nd and 3rd finger   INGUINAL HERNIA REPAIR     bilateral   KNEE ARTHROSCOPY  02/2011   L knee for meniscal tear. [name]   PERMANENT PACEMAKER INSERTION N/A 07/01/2014   MDT ADDRL1 pacemaker implanted for Mobitz II Dr Rayann Heman   UVULECTOMY     laser treatment for sleep apnea (NOT UP3)   VASECTOMY       Social History:  The patient  reports that he has never smoked. He has never used smokeless tobacco. He reports current alcohol use. He reports that he does not use drugs.   Family History:  The patient's family history includes Cancer in his mother; Dementia in his mother and paternal aunt; Diabetes in his maternal grandmother and sister; Heart attack in his maternal grandfather; Heart disease in his maternal grandfather; Melanoma (age of onset: 10) in his mother; Parkinson's disease in his maternal aunt; Stroke (age of onset: 75) in his father; Stroke (age of onset: 32) in his sister.    ROS:   Please see the history of present illness. All other systems are reviewed and  Negative to the above problem except as noted.    PHYSICAL EXAM: VS:  BP 126/76   Pulse 72   Ht 6\' 1"  (1.854 m)   Wt 223 lb 3.2 oz (101.2 kg)   SpO2 96%   BMI 29.45 kg/m   GEN: Well nourished, well developed, in no acute distress  HEENT: normal  Neck: JVP normal  No carotid bruits  Cardiac: RRR; no murmurs.  NO LE edema  Respiratory:  clear to auscultation bilaterally, GI: soft, nontender, nondistended, + BS  No hepatomegaly  MS: no deformity Moving all extremities   Skin: warm and dry, no rash  EKG:  EKG is ordered today.  SR 72 bpm   Paced     Lipid Panel    Component Value Date/Time   CHOL 127 09/02/2015 0001   TRIG 65 09/02/2015 0001   HDL 40 09/02/2015 0001   CHOLHDL 3.2 09/02/2015 0001   VLDL 13 09/02/2015 0001   LDLCALC 74 09/02/2015 0001      Wt Readings from Last 3 Encounters:  06/19/21 223 lb 3.2 oz (101.2 kg)  08/20/20 221 lb 12.8 oz (100.6 kg)  07/08/20 218 lb (98.9 kg)      ASSESSMENT AND PLAN:  1  Chronic systolic CHF Pt with BiV device   Mild LV dysfucntion on last echo in 2017   Will repeat   Keep on same meds for now      2  AV block  Initially s/p PPM  Then upgrade to BiV PPM  Follows with J Allred  3   PAF  as had one episode only  Continues to have device interrogations   Follow  Not on anticoag  4   HL  Pt folllows at [facility]   WIll have labs there     5  Diet   Continue to watch carbs.      Current medicines are reviewed at length with the patient today.  The patient does not have concerns regarding medicines.  Signed, Dorris Carnes, MD  06/19/2021 9:46 AM    [address], [place], [place]  [id number] Phone: [phone]; Fax: [phone]

## 2021-06-18 LAB — CUP PACEART REMOTE DEVICE CHECK
Battery Remaining Longevity: 6 mo
Battery Remaining Percentage: 7 %
Battery Voltage: 2.81 V
Brady Statistic AP VP Percent: 26 %
Brady Statistic AP VS Percent: 1 %
Brady Statistic AS VP Percent: 72 %
Brady Statistic AS VS Percent: 1.1 %
Brady Statistic RA Percent Paced: 27 %
Date Time Interrogation Session: 20221206234719
Implantable Lead Implant Date: 20151220
Implantable Lead Implant Date: 20151220
Implantable Lead Implant Date: 20160819
Implantable Lead Location: 753858
Implantable Lead Location: 753859
Implantable Lead Location: 753860
Implantable Lead Model: 5076
Implantable Lead Model: 5076
Implantable Pulse Generator Implant Date: 20160819
Lead Channel Impedance Value: 390 Ohm
Lead Channel Impedance Value: 410 Ohm
Lead Channel Impedance Value: 430 Ohm
Lead Channel Pacing Threshold Amplitude: 0.5 V
Lead Channel Pacing Threshold Amplitude: 0.625 V
Lead Channel Pacing Threshold Amplitude: 1 V
Lead Channel Pacing Threshold Pulse Width: 0.5 ms
Lead Channel Pacing Threshold Pulse Width: 0.5 ms
Lead Channel Pacing Threshold Pulse Width: 0.8 ms
Lead Channel Sensing Intrinsic Amplitude: 1.9 mV
Lead Channel Sensing Intrinsic Amplitude: 3.8 mV
Lead Channel Setting Pacing Amplitude: 1.625
Lead Channel Setting Pacing Amplitude: 2 V
Lead Channel Setting Pacing Amplitude: 2 V
Lead Channel Setting Pacing Pulse Width: 0.5 ms
Lead Channel Setting Pacing Pulse Width: 0.8 ms
Lead Channel Setting Sensing Sensitivity: 0.7 mV
Pulse Gen Model: 3262
Pulse Gen Serial Number: 3099689

## 2021-06-19 ENCOUNTER — Encounter: Payer: Self-pay | Admitting: Internal Medicine

## 2021-06-19 ENCOUNTER — Ambulatory Visit (INDEPENDENT_AMBULATORY_CARE_PROVIDER_SITE_OTHER): Payer: No Typology Code available for payment source | Admitting: Internal Medicine

## 2021-06-19 ENCOUNTER — Other Ambulatory Visit: Payer: Self-pay

## 2021-06-19 VITALS — BP 126/76 | HR 72 | Ht 73.0 in | Wt 223.2 lb

## 2021-06-19 DIAGNOSIS — I5022 Chronic systolic (congestive) heart failure: Secondary | ICD-10-CM

## 2021-06-19 DIAGNOSIS — I428 Other cardiomyopathies: Secondary | ICD-10-CM

## 2021-06-19 DIAGNOSIS — Z950 Presence of cardiac pacemaker: Secondary | ICD-10-CM | POA: Diagnosis not present

## 2021-06-19 NOTE — Patient Instructions (Signed)
Medication Instructions:  Your physician has requested that you have an echocardiogram. Echocardiography is a painless test that uses sound waves to create images of your heart. It provides your doctor with information about the size and shape of your heart and how well your heart's chambers and valves are working. This procedure takes approximately one hour. There are no restrictions for this procedure.  *If you need a refill on your cardiac medications before your next appointment, please call your pharmacy*   Lab Work: none If you have labs (blood work) drawn today and your tests are completely normal, you will receive your results only by: [place] (if you have MyChart) OR A paper copy in the mail If you have any lab test that is abnormal or we need to change your treatment, we will call you to review the results.   Testing/Procedures: Your physician has requested that you have an echocardiogram. Echocardiography is a painless test that uses sound waves to create images of your heart. It provides your doctor with information about the size and shape of your heart and how well your heart's chambers and valves are working. This procedure takes approximately one hour. There are no restrictions for this procedure.    Follow-Up: At [facility], you and your health needs are our priority.  As part of our continuing mission to provide you with exceptional heart care, we have created designated Provider Care Teams.  These Care Teams include your primary Cardiologist (physician) and Advanced Practice Providers (APPs -  Physician Assistants and Nurse Practitioners) who all work together to provide you with the care you need, when you need it.  We recommend signing up for the patient portal called "MyChart".  Sign up information is provided on this After Visit Summary.  MyChart is used to connect with patients for Virtual Visits (Telemedicine).  Patients are able to view lab/test results,  encounter notes, upcoming appointments, etc.  Non-urgent messages can be sent to your provider as well.   To learn more about what you can do with MyChart, go to [email or web address].    Your next appointment:   9 month(s)  The format for your next appointment:   In Person  Provider:   Dorris Carnes, MD     Other Instructions

## 2021-06-26 NOTE — Progress Notes (Signed)
Remote pacemaker transmission.   

## 2021-06-30 ENCOUNTER — Ambulatory Visit: Payer: No Typology Code available for payment source | Admitting: Internal Medicine

## 2021-06-30 ENCOUNTER — Encounter: Payer: Self-pay | Admitting: Internal Medicine

## 2021-06-30 ENCOUNTER — Other Ambulatory Visit: Payer: Self-pay

## 2021-06-30 VITALS — BP 122/66 | HR 71 | Ht 73.0 in | Wt 219.4 lb

## 2021-06-30 DIAGNOSIS — I428 Other cardiomyopathies: Secondary | ICD-10-CM

## 2021-06-30 DIAGNOSIS — G4733 Obstructive sleep apnea (adult) (pediatric): Secondary | ICD-10-CM | POA: Diagnosis not present

## 2021-06-30 DIAGNOSIS — I441 Atrioventricular block, second degree: Secondary | ICD-10-CM

## 2021-06-30 DIAGNOSIS — I447 Left bundle-branch block, unspecified: Secondary | ICD-10-CM | POA: Diagnosis not present

## 2021-06-30 DIAGNOSIS — I10 Essential (primary) hypertension: Secondary | ICD-10-CM

## 2021-06-30 DIAGNOSIS — I5022 Chronic systolic (congestive) heart failure: Secondary | ICD-10-CM

## 2021-06-30 LAB — CUP PACEART INCLINIC DEVICE CHECK
Battery Remaining Longevity: 6 mo
Battery Voltage: 2.81 V
Brady Statistic RA Percent Paced: 26 %
Brady Statistic RV Percent Paced: 98 %
Date Time Interrogation Session: 20221219095707
Implantable Lead Implant Date: 20151220
Implantable Lead Implant Date: 20151220
Implantable Lead Implant Date: 20160819
Implantable Lead Location: 753858
Implantable Lead Location: 753859
Implantable Lead Location: 753860
Implantable Lead Model: 5076
Implantable Lead Model: 5076
Implantable Pulse Generator Implant Date: 20160819
Lead Channel Impedance Value: 400 Ohm
Lead Channel Impedance Value: 400 Ohm
Lead Channel Impedance Value: 425 Ohm
Lead Channel Pacing Threshold Amplitude: 0.5 V
Lead Channel Pacing Threshold Amplitude: 0.5 V
Lead Channel Pacing Threshold Amplitude: 0.75 V
Lead Channel Pacing Threshold Amplitude: 0.75 V
Lead Channel Pacing Threshold Amplitude: 1 V
Lead Channel Pacing Threshold Amplitude: 1 V
Lead Channel Pacing Threshold Pulse Width: 0.5 ms
Lead Channel Pacing Threshold Pulse Width: 0.5 ms
Lead Channel Pacing Threshold Pulse Width: 0.5 ms
Lead Channel Pacing Threshold Pulse Width: 0.5 ms
Lead Channel Pacing Threshold Pulse Width: 0.8 ms
Lead Channel Pacing Threshold Pulse Width: 0.8 ms
Lead Channel Sensing Intrinsic Amplitude: 2.1 mV
Lead Channel Sensing Intrinsic Amplitude: 3.6 mV
Lead Channel Setting Pacing Amplitude: 1.5 V
Lead Channel Setting Pacing Amplitude: 2 V
Lead Channel Setting Pacing Amplitude: 2 V
Lead Channel Setting Pacing Pulse Width: 0.5 ms
Lead Channel Setting Pacing Pulse Width: 0.8 ms
Lead Channel Setting Sensing Sensitivity: 0.7 mV
Pulse Gen Model: 3262
Pulse Gen Serial Number: 3099689

## 2021-06-30 NOTE — Patient Instructions (Addendum)
Medication Instructions:  Your physician recommends that you continue on your current medications as directed. Please refer to the Current Medication list given to you today. *If you need a refill on your cardiac medications before your next appointment, please call your pharmacy*  Lab Work: None. If you have labs (blood work) drawn today and your tests are completely normal, you will receive your results only by: [place] (if you have MyChart) OR A paper copy in the mail If you have any lab test that is abnormal or we need to change your treatment, we will call you to review the results.  Testing/Procedures: None.  Follow-Up: At [facility], you and your health needs are our priority.  As part of our continuing mission to provide you with exceptional heart care, we have created designated Provider Care Teams.  These Care Teams include your primary Cardiologist (physician) and Advanced Practice Providers (APPs -  Physician Assistants and Nurse Practitioners) who all work together to provide you with the care you need, when you need it.  Your physician wants you to follow-up in: 6 months with   one of the following Advanced Practice Providers on your designated Care Team:    Legrand Como "Jonni Sanger" Chalmers Cater, [place]   You will receive a reminder letter in the mail two months in advance. If you don't receive a letter, please call our office to schedule the follow-up appointment.  Remote monitoring is used to monitor your Pacemaker from home. This monitoring reduces the number of office visits required to check your device to one time per year. It allows [place] to keep an eye on the functioning of your device to ensure it is working properly. You are scheduled for a device check from home on 07/15/21. You may send your transmission at any time that day. If you have a wireless device, the transmission will be sent automatically. After your physician reviews your transmission, you will receive a postcard  with your next transmission date.  We recommend signing up for the patient portal called "MyChart".  Sign up information is provided on this After Visit Summary.  MyChart is used to connect with patients for Virtual Visits (Telemedicine).  Patients are able to view lab/test results, encounter notes, upcoming appointments, etc.  Non-urgent messages can be sent to your provider as well.   To learn more about what you can do with MyChart, go to [email or web address].    Any Other Special Instructions Will Be Listed Below (If Applicable).

## 2021-06-30 NOTE — Progress Notes (Signed)
PCP: Rita Ohara, MD Primary Cardiologist: Dr Harrington Challenger Primary EP:  Dr Rayann Heman  Alexander Curtis is a 75 y.o. male who presents today for routine electrophysiology followup.  Since last being seen in our clinic, the patient reports doing very well.  Today, he denies symptoms of palpitations, chest pain, shortness of breath,  lower extremity edema, dizziness, presyncope, or syncope.  The patient is otherwise without complaint today.   Past Medical History:  Diagnosis Date   Arthritis of shoulder    right (CT done 11/2018)   Atrial enlargement, left    Bradycardia 06/30/2014   Diabetes mellitus, type 2 ([place])    Diabetic peripheral neuropathy ([place]) 01/24/2017   Diagnosed by [place], and prescribed gabapentin   HLD (hyperlipidemia)    Hypertension 05/2013   LBBB (left bundle branch block)    Melanoma ([place]) 1968   R arm   Mitral regurgitation    Mobitz type 2 second degree heart block    a. s/p Medtronic Adapta L model ADDRL 1 (serial number [id number]) pacemaker. 07/02/14 b.  upgrade to STJ CRTP 02/2015   Non-ischemic cardiomyopathy ([place])    a. felt to be 2/2 RV pacing   Obstructive sleep apnea    compliant with CPAP   Pacemaker    a. Medtronic Adapta L model ADDRL 1 (serial number [id number]) pacemaker.    Pulmonary nodules    noted on CT of shoulder 03/2016, up to 17mm in size on CT chest 04/2016. repeat 1 year due to h/o melanoma   Syncope 2011   a. 2011 -negative workup except LBBB. thought to be vasovagal   Tricuspid regurgitation    Past Surgical History:  Procedure Laterality Date   EP IMPLANTABLE DEVICE N/A 03/01/2015   STJ CRTP upgrade by Dr Rayann Heman   FINGER TENDON REPAIR     R 2nd and 3rd finger   INGUINAL HERNIA REPAIR     bilateral   KNEE ARTHROSCOPY  02/2011   L knee for meniscal tear. [name]   PERMANENT PACEMAKER INSERTION N/A 07/01/2014   MDT ADDRL1 pacemaker implanted for Mobitz II Dr Rayann Heman   UVULECTOMY     laser treatment for sleep apnea (NOT UP3)   VASECTOMY       ROS- all systems are reviewed and negative except as per HPI above  Current Outpatient Medications  Medication Sig Dispense Refill   aspirin 81 MG tablet Take 81 mg by mouth every other day.     carvedilol (COREG) 6.25 MG tablet TAKE 1 TABLET(6.25 MG) BY MOUTH TWICE DAILY WITH A MEAL 180 tablet 1   CINNAMON PO Take 2,000 mg by mouth 3 (three) times daily.      Coenzyme Q10 (COQ10) 200 MG CAPS Take 1 capsule by mouth 2 (two) times daily.      diphenhydrAMINE (BENADRYL) 50 MG tablet Take 50 mg by mouth as needed for allergies.     FIBER SELECT GUMMIES PO Take 1 each by mouth daily.     fish oil-omega-3 fatty acids 1000 MG capsule Take 1 g by mouth 3 (three) times daily.      gabapentin (NEURONTIN) 100 MG capsule Take 100 mg by mouth 2 (two) times daily.     lisinopril (PRINIVIL,ZESTRIL) 20 MG tablet Take 20 mg by mouth daily.     metFORMIN (GLUCOPHAGE) 500 MG tablet Take 500 mg by mouth daily with breakfast.     Multiple Vitamins-Minerals (MENS 50+ MULTI VITAMIN/MIN PO) Take 1 tablet  by mouth daily.     simvastatin (ZOCOR) 10 MG tablet Take 1 tablet by mouth at bedtime.     TURMERIC PO Take 1 tablet by mouth daily.     No current facility-administered medications for this visit.   Facility-Administered Medications Ordered in Other Visits  Medication Dose Route Frequency Provider Last Rate Last Admin   influenza  inactive virus vaccine (FLUZONE/FLUARIX) injection 0.5 mL  0.5 mL Intramuscular Once Rita Ohara, MD        Physical Exam: Vitals:   06/30/21 0910  BP: 122/66  Pulse: 71  SpO2: 95%  Weight: 219 lb 6.4 oz (99.5 kg)  Height: 6\' 1"  (1.854 m)    GEN- The patient is well appearing, alert and oriented x 3 today.   Head- normocephalic, atraumatic Eyes-  Sclera clear, conjunctiva pink Ears- hearing intact Oropharynx- clear Lungs- Clear to ausculation bilaterally, normal work of breathing Chest- pacemaker pocket is well healed Heart- Regular rate and rhythm, no murmurs,  rubs or gallops, PMI not laterally displaced GI- soft, NT, ND, + BS Extremities- no clubbing, cyanosis, or edema  Pacemaker interrogation- reviewed in detail today,  See PACEART report    Assessment and Plan:  1. Symptomatic second degree heart block with nonischemic CM, chronic systolic dysfunction Normal biventricular pacemaker function See Pace Art report No changes today he is not device dependant today He is approaching ERI. Risks, benefits, and alternatives to PPM pulse generator replacement were discussed in detail today.  The patient understands that risks include but are not limited to bleeding, infection, pneumothorax, perforation, tamponade, vascular damage, renal failure, MI, stroke, death, damage to his existing leads, and lead dislodgement and wishes to proceed once ERI. Echo is ordered but pending.  Clinically, NYHA Class I s/p CRT upgrade Follows with Sharman Cheek  2. HTN Stable No change required today  3. OSA Severe OSA  Uses CPAP  4. Paroxysmal atrial fibrillation Rare episodes No events in the past year Consider [place] if afib burden increases  Return to see EP APP in 6 months if he does not reach ERI in the interim.  [name] MD, [facility] 06/30/2021 9:11 AM

## 2021-07-09 ENCOUNTER — Other Ambulatory Visit: Payer: Self-pay

## 2021-07-09 ENCOUNTER — Encounter: Payer: Self-pay | Admitting: Pulmonary Disease

## 2021-07-09 ENCOUNTER — Ambulatory Visit (INDEPENDENT_AMBULATORY_CARE_PROVIDER_SITE_OTHER): Payer: No Typology Code available for payment source | Admitting: Pulmonary Disease

## 2021-07-09 VITALS — BP 112/64 | HR 60 | Temp 98.2°F | Ht 73.0 in | Wt 219.6 lb

## 2021-07-09 DIAGNOSIS — R918 Other nonspecific abnormal finding of lung field: Secondary | ICD-10-CM | POA: Diagnosis not present

## 2021-07-09 DIAGNOSIS — Z789 Other specified health status: Secondary | ICD-10-CM

## 2021-07-09 DIAGNOSIS — Z85820 Personal history of malignant melanoma of skin: Secondary | ICD-10-CM | POA: Diagnosis not present

## 2021-07-09 NOTE — Patient Instructions (Signed)
Thank you for visiting Dr. Valeta Harms at [facility]. Today we recommend the following:  Orders Placed This Encounter  Procedures   CT CHEST WO CONTRAST   Return in about 1 year (around 07/09/2022) for with APP or Dr. Valeta Harms.    Please do your part to reduce the spread of COVID-19.

## 2021-07-09 NOTE — Progress Notes (Signed)
Synopsis: Referred in December 2022 for lung nodule by Rita Ohara, MD  Subjective:   [name]: Alexander Curtis GENDER: male DOB: Nov 26, 1945, MRN: [id number]  Chief Complaint  Patient presents with   Consult    Patient is here to talk about lung nodules    This is a 75 year old gentleman, past medical history of nonischemic cardiomyopathy, hypertension, hyperlipidemia, pacemaker placement.  Recent skin cancer removal of the right lower extremity anterior shin.  Melanoma of the right forearm removed in 1968 after he returned from [place].  Patient is referred from the [facility] in [place].  He was seen by Dr. Buck Mam several years ago with a groundglass opacity in the chest which recommended ongoing follow-up for CT imaging.  Patient had a CT scan of the chest in October 2021 as well as a follow-up CT scan in November 2022.  This showed persistent right lower lobe small groundglass lesion.  She he has a stable left lower lobe pulmonary nodules 2 of which are in the left lower lobe that have not changed and have been considered benign for follow-up since 2018.  We reviewed the CTs today in the office in our PACS system with the patient.   Past Medical History:  Diagnosis Date   Arthritis of shoulder    right (CT done 11/2018)   Atrial enlargement, left    Bradycardia 06/30/2014   Diabetes mellitus, type 2 ([place])    Diabetic peripheral neuropathy ([place]) 01/24/2017   Diagnosed by [place], and prescribed gabapentin   HLD (hyperlipidemia)    Hypertension 05/2013   LBBB (left bundle branch block)    Melanoma ([place]) 1968   R arm   Mitral regurgitation    Mobitz type 2 second degree heart block    a. s/p Medtronic Adapta L model ADDRL 1 (serial number [id number]) pacemaker. 07/02/14 b.  upgrade to STJ CRTP 02/2015   Non-ischemic cardiomyopathy ([place])    a. felt to be 2/2 RV pacing   Obstructive sleep apnea    compliant with CPAP   Pacemaker    a. Medtronic Adapta L model ADDRL 1  (serial number [id number]) pacemaker.    Pulmonary nodules    noted on CT of shoulder 03/2016, up to 72mm in size on CT chest 04/2016. repeat 1 year due to h/o melanoma   Syncope 2011   a. 2011 -negative workup except LBBB. thought to be vasovagal   Tricuspid regurgitation      Family History  Problem Relation Age of Onset   Dementia Mother    Cancer Mother        ?type, was told it contributed to death, per pt   Melanoma Mother 28   Stroke Father 60       cerebral aneurysm   Diabetes Sister        borderline, obese   Stroke Sister 12   Diabetes Maternal Grandmother    Heart disease Maternal Grandfather    Heart attack Maternal Grandfather    Dementia Paternal Aunt    Parkinson's disease Maternal Aunt    Hypertension Neg Hx      Past Surgical History:  Procedure Laterality Date   EP IMPLANTABLE DEVICE N/A 03/01/2015   STJ CRTP upgrade by Dr Rayann Heman   FINGER TENDON REPAIR     R 2nd and 3rd finger   INGUINAL HERNIA REPAIR     bilateral   KNEE ARTHROSCOPY  02/2011   L knee for meniscal tear. Dr.  Blackman   PERMANENT PACEMAKER INSERTION N/A 07/01/2014   MDT ADDRL1 pacemaker implanted for Mobitz II Dr Rayann Heman   UVULECTOMY     laser treatment for sleep apnea (NOT UP3)   VASECTOMY      Social History   Socioeconomic History   Marital status: Married    Spouse name: Not on file   Number of children: 4   Years of education: Not on file   Highest education level: Not on file  Occupational History   Occupation: retired  Tobacco Use   Smoking status: Never   Smokeless tobacco: Never  Vaping Use   Vaping Use: Never used  Substance and Sexual Activity   Alcohol use: Yes    Comment: rare, some months without any   Drug use: No   Sexual activity: Yes    Partners: Female  Other Topics Concern   Not on file  Social History Narrative   Lives with wife, 1 dog.  Daughter, Junie Panning, in [place], [place] (Amy) in [place], son in [place], son in [place].10 grandchildren   Retired  school principal   Sold his house and moved to a townhome near [place]. Has a mountain chalet.   [facility]).   Social Determinants of Health   Financial Resource Strain: Not on file  Food Insecurity: Not on file  Transportation Needs: Not on file  Physical Activity: Not on file  Stress: Not on file  Social Connections: Not on file  Intimate Partner Violence: Not on file     Allergies  Allergen Reactions   Horse-Derived Products Anaphylaxis    REACTION: Anaphlactic Reaction.  Can't take tetanus shot   Pravastatin Other (See Comments)    Joint pain    Adhesive [Tape] Itching and Rash     Outpatient Medications Prior to Visit  Medication Sig Dispense Refill   aspirin 81 MG tablet Take 81 mg by mouth every other day.     carvedilol (COREG) 6.25 MG tablet TAKE 1 TABLET(6.25 MG) BY MOUTH TWICE DAILY WITH A MEAL 180 tablet 1   CINNAMON PO Take 2,000 mg by mouth 3 (three) times daily.      Coenzyme Q10 (COQ10) 200 MG CAPS Take 1 capsule by mouth 2 (two) times daily.      diphenhydrAMINE (BENADRYL) 50 MG tablet Take 50 mg by mouth as needed for allergies.     FIBER SELECT GUMMIES PO Take 1 each by mouth daily.     fish oil-omega-3 fatty acids 1000 MG capsule Take 1 g by mouth 3 (three) times daily.      gabapentin (NEURONTIN) 100 MG capsule Take 100 mg by mouth 2 (two) times daily.     lisinopril (PRINIVIL,ZESTRIL) 20 MG tablet Take 20 mg by mouth daily.     metFORMIN (GLUCOPHAGE) 500 MG tablet Take 500 mg by mouth daily with breakfast.     Multiple Vitamins-Minerals (MENS 50+ MULTI VITAMIN/MIN PO) Take 1 tablet by mouth daily.     simvastatin (ZOCOR) 10 MG tablet Take 1 tablet by mouth at bedtime.     TURMERIC PO Take 1 tablet by mouth daily.     Facility-Administered Medications Prior to Visit  Medication Dose Route Frequency Provider Last Rate Last Admin   influenza  inactive virus vaccine (FLUZONE/FLUARIX) injection 0.5 mL  0.5 mL  Intramuscular Once Rita Ohara, MD        Review of Systems  Constitutional:  Negative for chills, fever, malaise/fatigue and weight  loss.  HENT:  Negative for hearing loss, sore throat and tinnitus.   Eyes:  Negative for blurred vision and double vision.  Respiratory:  Negative for cough, hemoptysis, sputum production, shortness of breath, wheezing and stridor.   Cardiovascular:  Negative for chest pain, palpitations, orthopnea, leg swelling and PND.  Gastrointestinal:  Negative for abdominal pain, constipation, diarrhea, heartburn, nausea and vomiting.  Genitourinary:  Negative for dysuria, hematuria and urgency.  Musculoskeletal:  Negative for joint pain and myalgias.  Skin:  Negative for itching and rash.  Neurological:  Negative for dizziness, tingling, weakness and headaches.  Endo/Heme/Allergies:  Negative for environmental allergies. Does not bruise/bleed easily.  Psychiatric/Behavioral:  Negative for depression. The patient is not nervous/anxious and does not have insomnia.   All other systems reviewed and are negative.   Objective:  Physical Exam Vitals reviewed.  Constitutional:      General: He is not in acute distress.    Appearance: He is well-developed.  HENT:     Head: Normocephalic and atraumatic.  Eyes:     General: No scleral icterus.    Conjunctiva/sclera: Conjunctivae normal.     Pupils: Pupils are equal, round, and reactive to light.  Neck:     Vascular: No JVD.     Trachea: No tracheal deviation.  Cardiovascular:     Rate and Rhythm: Normal rate and regular rhythm.     Heart sounds: Normal heart sounds. No murmur heard. Pulmonary:     Effort: Pulmonary effort is normal. No tachypnea, accessory muscle usage or respiratory distress.     Breath sounds: No stridor. No wheezing, rhonchi or rales.  Abdominal:     General: There is no distension.     Palpations: Abdomen is soft.     Tenderness: There is no abdominal tenderness.  Musculoskeletal:         General: No tenderness.     Cervical back: Neck supple.  Lymphadenopathy:     Cervical: No cervical adenopathy.  Skin:    General: Skin is warm and dry.     Capillary Refill: Capillary refill takes less than 2 seconds.     Findings: No rash.     Comments: Well-healed scar from right forearm, previous melanoma.  Right anterior shin of the lower extremity with healing wound after recent skin cancer removal.  Neurological:     Mental Status: He is alert and oriented to person, place, and time.  Psychiatric:        Behavior: Behavior normal.     Vitals:   07/09/21 1347  BP: 112/64  Pulse: 60  Temp: 98.2 F (36.8 C)  TempSrc: Oral  SpO2: 96%  Weight: 219 lb 9.6 oz (99.6 kg)  Height: 6\' 1"  (1.854 m)   96% on RA BMI Readings from Last 3 Encounters:  07/09/21 28.97 kg/m  06/30/21 28.95 kg/m  06/19/21 29.45 kg/m   Wt Readings from Last 3 Encounters:  07/09/21 219 lb 9.6 oz (99.6 kg)  06/30/21 219 lb 6.4 oz (99.5 kg)  06/19/21 223 lb 3.2 oz (101.2 kg)     CBC    Component Value Date/Time   WBC 4.1 09/14/2017 0937   WBC 5.0 08/17/2016 1052   RBC 4.65 09/14/2017 0937   RBC 4.72 08/17/2016 1052   HGB 14.4 09/14/2017 0937   HCT 43.2 09/14/2017 0937   PLT 133 (L) 09/14/2017 0937   MCV 93 09/14/2017 0937   MCH 31.0 09/14/2017 0937   MCH 30.7 08/17/2016 1052   MCHC 33.3  09/14/2017 0937   MCHC 32.9 08/17/2016 1052   RDW 13.9 09/14/2017 0937   LYMPHSABS 1.0 09/14/2017 0937   MONOABS 700 08/17/2016 1052   EOSABS 0.1 09/14/2017 0937   BASOSABS 0.0 09/14/2017 2423     Chest Imaging: May 29, 2021: CT imaging reviewed in PACS system completed at [place]. Stable left lower lobe pulmonary nodule, small right lower lobe groundglass opacity. The patient's images have been independently reviewed by me.    Pulmonary Functions Testing Results: No flowsheet data found.  FeNO:   Pathology:   Echocardiogram:   Heart Catheterization:     Assessment &  Plan:     ICD-10-CM   1. Multiple pulmonary nodules  R91.8 CT CHEST WO CONTRAST    2. Ground glass opacity present on imaging of lung  R91.8     3. Nonsmoker  Z78.9     4. History of melanoma  Z85.820       Discussion:  This is a 75 year old gentleman, history of nonischemic cardiomyopathy, pacemaker placement.  History of melanoma of the right forearm in 1968 status post removal.  History of skin cancer.  Found to have multiple pulmonary nodules some of which have been stable for many years since 2018 in the left lower lobe.  However does have a groundglass opacity in the right.  Plan: Patient is a non-smoker.  Does have a history of malignancy and do think that his lung nodules need to be followed. Will have a repeat noncontrasted CT scan of the chest in 1 year. Patient is agreeable to this plan. We reviewed his images today in the office answer all questions.  Patient to follow-up with [place] in 1 year after his CT scan of the chest is complete. We appreciate consultation.   Current Outpatient Medications:    aspirin 81 MG tablet, Take 81 mg by mouth every other day., Disp: , Rfl:    carvedilol (COREG) 6.25 MG tablet, TAKE 1 TABLET(6.25 MG) BY MOUTH TWICE DAILY WITH A MEAL, Disp: 180 tablet, Rfl: 1   CINNAMON PO, Take 2,000 mg by mouth 3 (three) times daily. , Disp: , Rfl:    Coenzyme Q10 (COQ10) 200 MG CAPS, Take 1 capsule by mouth 2 (two) times daily. , Disp: , Rfl:    diphenhydrAMINE (BENADRYL) 50 MG tablet, Take 50 mg by mouth as needed for allergies., Disp: , Rfl:    FIBER SELECT GUMMIES PO, Take 1 each by mouth daily., Disp: , Rfl:    fish oil-omega-3 fatty acids 1000 MG capsule, Take 1 g by mouth 3 (three) times daily. , Disp: , Rfl:    gabapentin (NEURONTIN) 100 MG capsule, Take 100 mg by mouth 2 (two) times daily., Disp: , Rfl:    lisinopril (PRINIVIL,ZESTRIL) 20 MG tablet, Take 20 mg by mouth daily., Disp: , Rfl:    metFORMIN (GLUCOPHAGE) 500 MG tablet, Take 500 mg by  mouth daily with breakfast., Disp: , Rfl:    Multiple Vitamins-Minerals (MENS 50+ MULTI VITAMIN/MIN PO), Take 1 tablet by mouth daily., Disp: , Rfl:    simvastatin (ZOCOR) 10 MG tablet, Take 1 tablet by mouth at bedtime., Disp: , Rfl:    TURMERIC PO, Take 1 tablet by mouth daily., Disp: , Rfl:  No current facility-administered medications for this visit.  Facility-Administered Medications Ordered in Other Visits:    influenza  inactive virus vaccine (FLUZONE/FLUARIX) injection 0.5 mL, 0.5 mL, Intramuscular, Once, Rita Ohara, MD   Garner Nash, DO [facility] 07/09/2021 2:25 PM

## 2021-07-15 ENCOUNTER — Ambulatory Visit (INDEPENDENT_AMBULATORY_CARE_PROVIDER_SITE_OTHER): Payer: No Typology Code available for payment source

## 2021-07-15 DIAGNOSIS — Z950 Presence of cardiac pacemaker: Secondary | ICD-10-CM

## 2021-07-15 DIAGNOSIS — I5022 Chronic systolic (congestive) heart failure: Secondary | ICD-10-CM | POA: Diagnosis not present

## 2021-07-17 ENCOUNTER — Telehealth: Payer: Self-pay

## 2021-07-17 NOTE — Telephone Encounter (Signed)
NOTES SCANNED TO REFERRAL 

## 2021-07-18 NOTE — Progress Notes (Signed)
EPIC Encounter for ICM Monitoring  Patient Name: DANISH RUFFINS is a 76 y.o. male Date: 07/18/2021 Primary Care Physican: Rita Ohara, MD Primary Cardiologist: Harrington Challenger Electrophysiologist: Allred Bi-V Pacing:  98%      06/30/2021 Weight: 219 lbs      Spoke with patient and heart failure questions reviewed.  Pt asymptomatic for fluid accumulation.  Reports feeling well at this time and voices no complaints.  He has an echo scheduled on 1/16.    Corvue thoracic impedance normal.   Prescribed: No diuretic   Recommendations:  No changes and encouraged to call if experiencing any fluid symptoms.   Follow-up plan: ICM clinic phone appointment on 08/18/2021.   91 day device clinic remote transmission 09/16/2021.   EP/Cardiology Office Visits:  Recall 03/18/2022 with Dr. Harrington Challenger.  Recall 12/27/2021 with Oda Kilts, PA.     Copy of ICM check sent to Dr. Rayann Heman.     3 month ICM trend: 07/15/2021.    12-14 Month ICM trend:     [name], RN 07/18/2021 1:33 PM

## 2021-07-25 ENCOUNTER — Other Ambulatory Visit (HOSPITAL_COMMUNITY): Payer: Medicare HMO

## 2021-07-28 ENCOUNTER — Other Ambulatory Visit: Payer: Self-pay

## 2021-07-28 ENCOUNTER — Ambulatory Visit (HOSPITAL_COMMUNITY): Payer: No Typology Code available for payment source | Attending: Cardiology

## 2021-07-28 DIAGNOSIS — I5022 Chronic systolic (congestive) heart failure: Secondary | ICD-10-CM | POA: Insufficient documentation

## 2021-07-28 DIAGNOSIS — I428 Other cardiomyopathies: Secondary | ICD-10-CM | POA: Insufficient documentation

## 2021-07-28 LAB — ECHOCARDIOGRAM COMPLETE
AR max vel: 4.08 cm2
AV Area VTI: 3.87 cm2
AV Area mean vel: 3.74 cm2
AV Mean grad: 3.5 mmHg
AV Peak grad: 6.2 mmHg
Ao pk vel: 1.25 m/s
Area-P 1/2: 2.89 cm2
S' Lateral: 3.15 cm

## 2021-08-18 ENCOUNTER — Ambulatory Visit (INDEPENDENT_AMBULATORY_CARE_PROVIDER_SITE_OTHER): Payer: No Typology Code available for payment source

## 2021-08-18 DIAGNOSIS — Z950 Presence of cardiac pacemaker: Secondary | ICD-10-CM | POA: Diagnosis not present

## 2021-08-18 DIAGNOSIS — I5022 Chronic systolic (congestive) heart failure: Secondary | ICD-10-CM | POA: Diagnosis not present

## 2021-08-18 NOTE — Progress Notes (Signed)
EPIC Encounter for ICM Monitoring  Patient Name: Alexander Curtis is a 76 y.o. male Date: 08/18/2021 Primary Care Physican: Rita Ohara, MD Primary Cardiologist: Harrington Challenger Electrophysiologist: Allred Bi-V Pacing:  98%      08/18/2021 Weight: 216 lbs 220  Battery ERI: 5.6 months     Spoke with patient and heart failure questions reviewed.  Pt .  Reports some weight gain over the last few weeks but is returning to baseline. He has been eating foods that are higher in salt but is getting back on low salt diet.     Corvue thoracic impedance suggesting possible fluid accumulation since 1/30 but trending back to baseline.   Prescribed: No diuretic   Recommendations:  No changes and encouraged to call if experiencing any fluid symptoms.  Advised to limit salt intake.   Follow-up plan: ICM clinic phone appointment on 09/23/2021.   91 day device clinic remote transmission 09/22/2021.   EP/Cardiology Office Visits:  09/04/2021 with Dr Caryl Comes.  Recall 03/18/2022 with Dr. Harrington Challenger.  Recall 12/27/2021 with Oda Kilts, PA.     Copy of ICM check sent to Dr. Rayann Heman.     3 month ICM trend: 08/18/2021.    12-14 Month ICM trend:     [name], RN 08/18/2021 1:58 PM

## 2021-09-04 ENCOUNTER — Encounter: Payer: Self-pay | Admitting: Internal Medicine

## 2021-09-04 ENCOUNTER — Ambulatory Visit (INDEPENDENT_AMBULATORY_CARE_PROVIDER_SITE_OTHER): Payer: No Typology Code available for payment source | Admitting: Internal Medicine

## 2021-09-04 VITALS — BP 127/70 | HR 64 | Ht 72.0 in | Wt 219.4 lb

## 2021-09-04 DIAGNOSIS — I441 Atrioventricular block, second degree: Secondary | ICD-10-CM

## 2021-09-04 DIAGNOSIS — Z950 Presence of cardiac pacemaker: Secondary | ICD-10-CM | POA: Diagnosis not present

## 2021-09-04 DIAGNOSIS — I447 Left bundle-branch block, unspecified: Secondary | ICD-10-CM

## 2021-09-04 DIAGNOSIS — I428 Other cardiomyopathies: Secondary | ICD-10-CM | POA: Diagnosis not present

## 2021-09-04 DIAGNOSIS — Z79899 Other long term (current) drug therapy: Secondary | ICD-10-CM

## 2021-09-04 NOTE — Patient Instructions (Signed)
Medication Instructions:  Your physician recommends that you continue on your current medications as directed. Please refer to the Current Medication list given to you today.  *If you need a refill on your cardiac medications before your next appointment, please call your pharmacy*   Lab Work: None ordered.  If you have labs (blood work) drawn today and your tests are completely normal, you will receive your results only by: [place] (if you have MyChart) OR A paper copy in the mail If you have any lab test that is abnormal or we need to change your treatment, we will call you to review the results.   Testing/Procedures: None ordered.    Follow-Up: At [facility], you and your health needs are our priority.  As part of our continuing mission to provide you with exceptional heart care, we have created designated Provider Care Teams.  These Care Teams include your primary Cardiologist (physician) and Advanced Practice Providers (APPs -  Physician Assistants and Nurse Practitioners) who all work together to provide you with the care you need, when you need it.  We recommend signing up for the patient portal called "MyChart".  Sign up information is provided on this After Visit Summary.  MyChart is used to connect with patients for Virtual Visits (Telemedicine).  Patients are able to view lab/test results, encounter notes, upcoming appointments, etc.  Non-urgent messages can be sent to your provider as well.   To learn more about what you can do with MyChart, go to [email or web address].    Your next appointment:   July 2023 with Dr Caryl Comes

## 2021-09-04 NOTE — Progress Notes (Signed)
Patient Care Team: Rita Ohara, MD as PCP - General (Family Medicine) Fay Records, MD as PCP - Cardiology (Cardiology)   HPI  Alexander Curtis is a 76 y.o. male Seen today, a prior patient of Dr. Greggory Brandy, who underwent pacing for syncope intermittent left bundle branch block and then symptomatic 2: 1 AV block.  Pacing was associated with rapid deterioration in LV function as noted below, and he then underwent upgrade with interval recovery.  The patient denies chest pain, shortness of breath, nocturnal dyspnea, orthopnea or peripheral edema.  There have been no palpitations, lightheadedness or syncope.        DATE TEST EF   12/15 Echo  55-60 %   4/16 Echo   35-40 %   2/17 Echo   45-50 %   1/23 Echo  60-65% LAE            Records and Results Reviewed    Past Medical History:  Diagnosis Date   Arthritis of shoulder    right (CT done 11/2018)   Atrial enlargement, left    Bradycardia 06/30/2014   Diabetes mellitus, type 2 ([place])    Diabetic peripheral neuropathy ([place]) 01/24/2017   Diagnosed by [place], and prescribed gabapentin   HLD (hyperlipidemia)    Hypertension 05/2013   LBBB (left bundle branch block)    Melanoma ([place]) 1968   R arm   Mitral regurgitation    Mobitz type 2 second degree heart block    a. s/p Medtronic Adapta L model ADDRL 1 (serial number [id number]) pacemaker. 07/02/14 b.  upgrade to STJ CRTP 02/2015   Non-ischemic cardiomyopathy ([place])    a. felt to be 2/2 RV pacing   Obstructive sleep apnea    compliant with CPAP   Pacemaker    a. Medtronic Adapta L model ADDRL 1 (serial number [id number]) pacemaker.    Pulmonary nodules    noted on CT of shoulder 03/2016, up to 27mm in size on CT chest 04/2016. repeat 1 year due to h/o melanoma   Syncope 2011   a. 2011 -negative workup except LBBB. thought to be vasovagal   Tricuspid regurgitation     Past Surgical History:  Procedure Laterality Date   EP IMPLANTABLE DEVICE N/A 03/01/2015   STJ CRTP upgrade  by Dr Rayann Heman   FINGER TENDON REPAIR     R 2nd and 3rd finger   INGUINAL HERNIA REPAIR     bilateral   KNEE ARTHROSCOPY  02/2011   L knee for meniscal tear. [name]   PERMANENT PACEMAKER INSERTION N/A 07/01/2014   MDT ADDRL1 pacemaker implanted for Mobitz II Dr Rayann Heman   UVULECTOMY     laser treatment for sleep apnea (NOT UP3)   VASECTOMY      Current Meds  Medication Sig   aspirin 81 MG tablet Take 81 mg by mouth every other day.   carvedilol (COREG) 6.25 MG tablet TAKE 1 TABLET(6.25 MG) BY MOUTH TWICE DAILY WITH A MEAL   CINNAMON PO Take 2,000 mg by mouth 3 (three) times daily.    Coenzyme Q10 (COQ10) 200 MG CAPS Take 1 capsule by mouth 2 (two) times daily.    diphenhydrAMINE (BENADRYL) 50 MG tablet Take 50 mg by mouth as needed for allergies.   FIBER SELECT GUMMIES PO Take 1 each by mouth daily.   fish oil-omega-3 fatty acids 1000 MG capsule Take 1 g by mouth 3 (three) times daily.  gabapentin (NEURONTIN) 100 MG capsule Take 100 mg by mouth 2 (two) times daily.   lisinopril (PRINIVIL,ZESTRIL) 20 MG tablet Take 20 mg by mouth daily.   metFORMIN (GLUCOPHAGE) 500 MG tablet Take 500 mg by mouth daily with breakfast.   Multiple Vitamins-Minerals (MENS 50+ MULTI VITAMIN/MIN PO) Take 1 tablet by mouth daily.   simvastatin (ZOCOR) 10 MG tablet Take 1 tablet by mouth at bedtime.   TURMERIC PO Take 1 tablet by mouth daily.    Allergies  Allergen Reactions   Horse-Derived Products Anaphylaxis    REACTION: Anaphlactic Reaction.  Can't take tetanus shot   Pravastatin Other (See Comments)    Joint pain    Adhesive [Tape] Itching and Rash      Review of Systems negative except from HPI and PMH  Physical Exam BP 127/70    Pulse 64    Ht 6' (1.829 m)    Wt 219 lb 6.4 oz (99.5 kg)    SpO2 96%    BMI 29.76 kg/m  Well developed and well nourished in no acute distress HENT normal E scleral and icterus clear Neck Supple JVP flat; carotids brisk and full Clear to  ausculation  Regular rate and rhythm, no murmurs gallops or rub Soft with active bowel sounds No clubbing cyanosis  Edema Alert and oriented, grossly normal motor and sensory function Skin Warm and Dry  ECG sinus rhythm with ventricular pacing with pacing spikes in the middle of the QRS.  Reprogramming of the device to shorten the AV delay and a forced ventricular pacing as opposed to search resulted in P synchronous pacing at 64 Interval 14/13/45 sQR in lead I and rS in lead V1  CrCl cannot be calculated (Patient's most recent lab result is older than the maximum 21 days allowed.).   Assessment and  Plan  High-grade intermittent heart block  Pacemaker induced cardiomyopathy  Status post pacemaker with CRT upgrade  Right ventricular amplitude low with right ventricular under sensing and inappropriate ventricular pacing   There is been normalization of his pacemaker cardiomyopathy.  We will continue on carvedilol 6.25 twice daily and lisinopril 20 mg daily.  A lot of time was spent trying to understand the inappropriate ventricular pacing and the algorithms that had been programmed that allow that.  We disabled search and programmed his sensed AV at 80 ms and his paced AV at 150 ms both of which were associated with QRS morphologies consistent with resynchronization with an RS in lead V1.  We will check his metabolic profile and his CBC on his cardiac meds.  So this is what this is what the problem so QRS pacing spike in the middle of what the world for this is what needed to be in what was happening is he was under sensing discussed his R waves are so low we have to figure that out because.   48 min was spent in care of the patient including the review of records    Current medicines are reviewed at length with the patient today .  The patient does not  have concerns regarding medicines.

## 2021-09-08 ENCOUNTER — Telehealth: Payer: Self-pay

## 2021-09-08 NOTE — Telephone Encounter (Signed)
Received call from patient.  He asked to inform Dr Caryl Comes regarding he felt some tingling sensation around his device about 6 months ago.  At the time of the 2/23 OV with Dr Caryl Comes he could not recall any symptoms or problems related to his device.  He thought the sensation may be related to the device problems that Dr Caryl Comes discovered at [facility].  Advised will forward the information to Dr Caryl Comes as Juluis Rainier.  He feels fine at this time.

## 2021-09-22 ENCOUNTER — Ambulatory Visit (INDEPENDENT_AMBULATORY_CARE_PROVIDER_SITE_OTHER): Payer: No Typology Code available for payment source

## 2021-09-22 DIAGNOSIS — I441 Atrioventricular block, second degree: Secondary | ICD-10-CM

## 2021-09-23 ENCOUNTER — Telehealth: Payer: Self-pay

## 2021-09-23 ENCOUNTER — Ambulatory Visit (INDEPENDENT_AMBULATORY_CARE_PROVIDER_SITE_OTHER): Payer: No Typology Code available for payment source

## 2021-09-23 DIAGNOSIS — Z950 Presence of cardiac pacemaker: Secondary | ICD-10-CM | POA: Diagnosis not present

## 2021-09-23 DIAGNOSIS — I5022 Chronic systolic (congestive) heart failure: Secondary | ICD-10-CM

## 2021-09-23 LAB — CUP PACEART REMOTE DEVICE CHECK
Battery Remaining Longevity: 1 mo
Battery Remaining Percentage: 1 %
Battery Voltage: 2.8 V
Brady Statistic AP VP Percent: 17 %
Brady Statistic AP VS Percent: 1 %
Brady Statistic AS VP Percent: 83 %
Brady Statistic AS VS Percent: 1 %
Brady Statistic RA Percent Paced: 16 %
Date Time Interrogation Session: 20230313030017
Implantable Lead Implant Date: 20151220
Implantable Lead Implant Date: 20151220
Implantable Lead Implant Date: 20160819
Implantable Lead Location: 753858
Implantable Lead Location: 753859
Implantable Lead Location: 753860
Implantable Lead Model: 5076
Implantable Lead Model: 5076
Implantable Pulse Generator Implant Date: 20160819
Lead Channel Impedance Value: 410 Ohm
Lead Channel Impedance Value: 440 Ohm
Lead Channel Impedance Value: 440 Ohm
Lead Channel Pacing Threshold Amplitude: 0.625 V
Lead Channel Pacing Threshold Amplitude: 0.75 V
Lead Channel Pacing Threshold Amplitude: 1.125 V
Lead Channel Pacing Threshold Pulse Width: 0.5 ms
Lead Channel Pacing Threshold Pulse Width: 0.5 ms
Lead Channel Pacing Threshold Pulse Width: 0.8 ms
Lead Channel Sensing Intrinsic Amplitude: 3.3 mV
Lead Channel Sensing Intrinsic Amplitude: 9.6 mV
Lead Channel Setting Pacing Amplitude: 1.625
Lead Channel Setting Pacing Amplitude: 2 V
Lead Channel Setting Pacing Amplitude: 2.125
Lead Channel Setting Pacing Pulse Width: 0.5 ms
Lead Channel Setting Pacing Pulse Width: 0.8 ms
Lead Channel Setting Sensing Sensitivity: 2 mV
Pulse Gen Model: 3262
Pulse Gen Serial Number: 3099689

## 2021-09-23 NOTE — Progress Notes (Signed)
EPIC Encounter for ICM Monitoring ? ?Patient Name: Alexander Curtis is a 76 y.o. male ?Date: 09/23/2021 ?Primary Care Physican: Rita Ohara, MD ?Primary Cardiologist: Harrington Challenger ?Electrophysiologist: Caryl Comes ?Bi-V Pacing:  >99%      ?08/18/2021 Weight: 216 lbs 220 ?  ?Battery ERI: <3 months ?  ?  ?Attempted call to patient and unable to reach.  Left detailed message per DPR regarding transmission. Transmission reviewed.   ?  ?Corvue thoracic impedance suggesting possible fluid accumulation starting 3/12. ?  ?Prescribed: No diuretic ?  ?Recommendations:  Left voice mail with ICM number and encouraged to call if experiencing any fluid symptoms. ?  ?Follow-up plan: ICM clinic phone appointment on 10/01/2021 to recheck fluid symptoms.   91 day device clinic remote transmission 12/22/2021. ?  ?EP/Cardiology Office Visits:   Recall 03/18/2022 with Dr. Harrington Challenger.  Recall 12/27/2021 with Oda Kilts, PA.   ?  ?Copy of ICM check sent to Dr. Caryl Comes.   ? ?3 month ICM trend: 09/23/2021. ? ? ? ?12-14 Month ICM trend:  ? ? ? ?[name], RN ?09/23/2021 ?8:29 AM ? ?

## 2021-09-23 NOTE — Telephone Encounter (Signed)
Remote ICM transmission received.  Attempted call to patient regarding ICM remote transmission and left detailed message per DPR.  Advised to return call for any fluid symptoms or questions. Next ICM remote transmission scheduled 10/01/2021.   ? ?

## 2021-09-23 NOTE — Telephone Encounter (Signed)
Scheduled remote reviewed. Normal device function.   ?Battery estimated <77mo?Next remote 3/22, route to verify remote schedule ?[place] ? ?Attempted to contact patient about battery longevity and need for monthly remote monitoring. Per wife patient is not available. Wife on DPR. Provided with with information and all questions answered. Wife will advise patient of battery status and monthly remote battery checks.  ? ? ? ? ?

## 2021-10-01 ENCOUNTER — Ambulatory Visit (INDEPENDENT_AMBULATORY_CARE_PROVIDER_SITE_OTHER): Payer: No Typology Code available for payment source

## 2021-10-01 DIAGNOSIS — I5022 Chronic systolic (congestive) heart failure: Secondary | ICD-10-CM

## 2021-10-01 DIAGNOSIS — Z950 Presence of cardiac pacemaker: Secondary | ICD-10-CM

## 2021-10-01 NOTE — Progress Notes (Signed)
EPIC Encounter for ICM Monitoring ? ?Patient Name: Alexander Curtis is a 76 y.o. male ?Date: 10/01/2021 ?Primary Care Physican: Rita Ohara, MD ?Primary Cardiologist: Harrington Challenger ?Electrophysiologist: Caryl Comes ?Bi-V Pacing:  >99%      ?08/18/2021 Weight: 216 lbs  ?  ?Battery ERI: 4.9 months ?  ?  ?Spoke with patient and heart failure questions reviewed.  Pt has a cold for past week.  ?  ?Corvue thoracic impedance suggesting possible fluid accumulation starting 3/12 but returned close to normal. ?  ?Prescribed: No diuretic ?  ?Recommendations:  No changes and encouraged to call if experiencing any fluid symptoms. ?  ?Follow-up plan: ICM clinic phone appointment on 10/27/2021.   91 day device clinic remote transmission 12/22/2021. ?  ?EP/Cardiology Office Visits:   Recall 03/18/2022 with Dr. Harrington Challenger.  Recall 12/27/2021 with Oda Kilts, PA.   ?  ?Copy of ICM check sent to Dr. Caryl Comes.   ? ?3 month ICM trend: 10/01/2021. ? ? ? ?12-14 Month ICM trend:  ? ? ? ?[name], RN ?10/01/2021 ?12:29 PM ? ?

## 2021-10-06 NOTE — Progress Notes (Signed)
Remote ICD transmission.   

## 2021-10-23 ENCOUNTER — Ambulatory Visit (INDEPENDENT_AMBULATORY_CARE_PROVIDER_SITE_OTHER): Payer: No Typology Code available for payment source

## 2021-10-23 DIAGNOSIS — Z950 Presence of cardiac pacemaker: Secondary | ICD-10-CM | POA: Diagnosis not present

## 2021-10-23 DIAGNOSIS — I441 Atrioventricular block, second degree: Secondary | ICD-10-CM

## 2021-10-24 LAB — CUP PACEART REMOTE DEVICE CHECK
Battery Remaining Longevity: 4 mo
Battery Remaining Percentage: 4 %
Battery Voltage: 2.75 V
Brady Statistic AP VP Percent: 22 %
Brady Statistic AP VS Percent: 1 %
Brady Statistic AS VP Percent: 78 %
Brady Statistic AS VS Percent: 1 %
Brady Statistic RA Percent Paced: 22 %
Date Time Interrogation Session: 20230414101921
Implantable Lead Implant Date: 20151220
Implantable Lead Implant Date: 20151220
Implantable Lead Implant Date: 20160819
Implantable Lead Location: 753858
Implantable Lead Location: 753859
Implantable Lead Location: 753860
Implantable Lead Model: 5076
Implantable Lead Model: 5076
Implantable Pulse Generator Implant Date: 20160819
Lead Channel Impedance Value: 390 Ohm
Lead Channel Impedance Value: 390 Ohm
Lead Channel Impedance Value: 400 Ohm
Lead Channel Pacing Threshold Amplitude: 0.5 V
Lead Channel Pacing Threshold Amplitude: 0.5 V
Lead Channel Pacing Threshold Amplitude: 0.875 V
Lead Channel Pacing Threshold Pulse Width: 0.5 ms
Lead Channel Pacing Threshold Pulse Width: 0.5 ms
Lead Channel Pacing Threshold Pulse Width: 0.8 ms
Lead Channel Sensing Intrinsic Amplitude: 12 mV
Lead Channel Sensing Intrinsic Amplitude: 2.8 mV
Lead Channel Setting Pacing Amplitude: 1.5 V
Lead Channel Setting Pacing Amplitude: 2 V
Lead Channel Setting Pacing Amplitude: 2 V
Lead Channel Setting Pacing Pulse Width: 0.5 ms
Lead Channel Setting Pacing Pulse Width: 0.8 ms
Lead Channel Setting Sensing Sensitivity: 2 mV
Pulse Gen Model: 3262
Pulse Gen Serial Number: 3099689

## 2021-10-29 ENCOUNTER — Ambulatory Visit (INDEPENDENT_AMBULATORY_CARE_PROVIDER_SITE_OTHER): Payer: No Typology Code available for payment source

## 2021-10-29 DIAGNOSIS — Z950 Presence of cardiac pacemaker: Secondary | ICD-10-CM

## 2021-10-29 DIAGNOSIS — I5022 Chronic systolic (congestive) heart failure: Secondary | ICD-10-CM | POA: Diagnosis not present

## 2021-10-31 ENCOUNTER — Telehealth: Payer: Self-pay

## 2021-10-31 NOTE — Telephone Encounter (Signed)
Remote ICM transmission received.  Attempted call to patient regarding ICM remote transmission and no answer or voice mail option.  

## 2021-10-31 NOTE — Progress Notes (Signed)
EPIC Encounter for ICM Monitoring ? ?Patient Name: Alexander Curtis is a 76 y.o. male ?Date: 10/31/2021 ?Primary Care Physican: Rita Ohara, MD ?Primary Cardiologist: Harrington Challenger ?Electrophysiologist: Caryl Comes ?Bi-V Pacing:  >99%      ?08/18/2021 Weight: 216 lbs  ?  ?Battery ERI: 3.9 months ?  ?  ?Attempted call to patient and unable to reach.  Transmission reviewed.  ?  ?Corvue thoracic impedance suggesting normal fluid levels. ?  ?Prescribed: No diuretic ?  ?Recommendations:  Unable to reach.   ?  ?Follow-up plan: ICM clinic phone appointment on 12/01/2021.   91 day device clinic remote transmission 12/22/2021. ?  ?EP/Cardiology Office Visits:   Recall 03/18/2022 with Dr. Harrington Challenger.  Recall 12/27/2021 with Oda Kilts, PA.   ?  ?Copy of ICM check sent to Dr. Caryl Comes.  ? ?3 month ICM trend: 10/29/2021. ? ? ? ?12-14 Month ICM trend:  ? ? ? ?[name], RN ?10/31/2021 ?3:31 PM ? ?

## 2021-11-04 ENCOUNTER — Telehealth: Payer: Self-pay | Admitting: Internal Medicine

## 2021-11-04 NOTE — Telephone Encounter (Signed)
Successful telephone encounter to patients wife, on DPR, to discuss patients concern for device battery life of 4 months. Per wife, they have a trip planned to go to [place] in September and patient is extremely concerned that his device will reach RRT during trip. Discuss with wife that even with RRT status, there is an additional 3 months of longevity. Wife states she will relay reassurance to patient. Will continue to monitor monthly remotes for battery life. Wife appreciative of call.  ?

## 2021-11-04 NOTE — Telephone Encounter (Signed)
?  1. Has your device fired? no ? ?2. Is you device beeping? No, but it is getting low ? ?3. Are you experiencing draining or swelling at device site? no ? ?4. Are you calling to see if we received your device transmission? No- battery is low and he says his next appointment is not until June ? ?5. Have you passed out? no ? ? ? ?Please route to Device Clinic Pool ? ?

## 2021-11-07 NOTE — Progress Notes (Signed)
Remote pacemaker transmission.   

## 2021-11-24 ENCOUNTER — Ambulatory Visit (INDEPENDENT_AMBULATORY_CARE_PROVIDER_SITE_OTHER): Payer: No Typology Code available for payment source

## 2021-11-24 DIAGNOSIS — I428 Other cardiomyopathies: Secondary | ICD-10-CM

## 2021-11-26 LAB — CUP PACEART REMOTE DEVICE CHECK
Battery Remaining Longevity: 3 mo
Battery Remaining Percentage: 3 %
Battery Voltage: 2.72 V
Brady Statistic AP VP Percent: 25 %
Brady Statistic AP VS Percent: 1 %
Brady Statistic AS VP Percent: 74 %
Brady Statistic AS VS Percent: 1 %
Brady Statistic RA Percent Paced: 25 %
Date Time Interrogation Session: 20230515080415
Implantable Lead Implant Date: 20151220
Implantable Lead Implant Date: 20151220
Implantable Lead Implant Date: 20160819
Implantable Lead Location: 753858
Implantable Lead Location: 753859
Implantable Lead Location: 753860
Implantable Lead Model: 5076
Implantable Lead Model: 5076
Implantable Pulse Generator Implant Date: 20160819
Lead Channel Impedance Value: 410 Ohm
Lead Channel Impedance Value: 430 Ohm
Lead Channel Impedance Value: 440 Ohm
Lead Channel Pacing Threshold Amplitude: 0.625 V
Lead Channel Pacing Threshold Amplitude: 0.75 V
Lead Channel Pacing Threshold Amplitude: 1.125 V
Lead Channel Pacing Threshold Pulse Width: 0.5 ms
Lead Channel Pacing Threshold Pulse Width: 0.5 ms
Lead Channel Pacing Threshold Pulse Width: 0.8 ms
Lead Channel Sensing Intrinsic Amplitude: 12 mV
Lead Channel Sensing Intrinsic Amplitude: 3.6 mV
Lead Channel Setting Pacing Amplitude: 1.625
Lead Channel Setting Pacing Amplitude: 2 V
Lead Channel Setting Pacing Amplitude: 2.125
Lead Channel Setting Pacing Pulse Width: 0.5 ms
Lead Channel Setting Pacing Pulse Width: 0.8 ms
Lead Channel Setting Sensing Sensitivity: 2 mV
Pulse Gen Model: 3262
Pulse Gen Serial Number: 3099689

## 2021-12-01 ENCOUNTER — Ambulatory Visit (INDEPENDENT_AMBULATORY_CARE_PROVIDER_SITE_OTHER): Payer: No Typology Code available for payment source

## 2021-12-01 DIAGNOSIS — I5022 Chronic systolic (congestive) heart failure: Secondary | ICD-10-CM | POA: Diagnosis not present

## 2021-12-01 DIAGNOSIS — Z950 Presence of cardiac pacemaker: Secondary | ICD-10-CM

## 2021-12-03 NOTE — Progress Notes (Signed)
EPIC Encounter for ICM Monitoring  Patient Name: Alexander Curtis is a 76 y.o. male Date: 12/03/2021 Primary Care Physican: Rita Ohara, MD Primary Cardiologist: Harrington Challenger Electrophysiologist: Vergie Living Pacing:  >99%      08/18/2021 Weight: 216 lbs    Battery ERI: 3.1 months (5/15 report)     Transmission reviewed.    Corvue thoracic impedance suggesting normal fluid levels.   Prescribed: No diuretic   Recommendations:  No changes    Follow-up plan: ICM clinic phone appointment on 01/05/2022.   91 day device clinic remote transmission 12/22/2021.   EP/Cardiology Office Visits:   Recall 03/18/2022 with Dr. Harrington Challenger.  Recall 12/27/2021 with Oda Kilts, PA.     Copy of ICM check sent to Dr. Caryl Comes.   3 month ICM trend: 11/30/2021.     [name], RN 12/03/2021 3:32 PM

## 2021-12-12 ENCOUNTER — Telehealth: Payer: Self-pay

## 2021-12-12 NOTE — Telephone Encounter (Signed)
Patient called in stating he is going out of the country for about a month in sept and his is also eri and has 3 months as of 11/24/2021. I have let patient know a nurse will call to go over the next steps before he leaves the country

## 2021-12-12 NOTE — Telephone Encounter (Signed)
Remote transmission reviewed. Battery longevity remains at <3 months, voltage 2.71, ERI at 2.62. Successful telephone call to patient to update. Patient appreciative of follow up. Anxious about trip to [place] in the fall as it relates to needing a gen change. Will continue to monitor battery longevity monthly.

## 2021-12-15 NOTE — Addendum Note (Signed)
Addended by: Cheri Kearns A on: 12/15/2021 12:12 PM   Modules accepted: Level of Service

## 2021-12-15 NOTE — Progress Notes (Signed)
Remote ICD transmission.   

## 2021-12-25 ENCOUNTER — Ambulatory Visit (INDEPENDENT_AMBULATORY_CARE_PROVIDER_SITE_OTHER): Payer: Medicare HMO

## 2021-12-25 DIAGNOSIS — I441 Atrioventricular block, second degree: Secondary | ICD-10-CM

## 2021-12-25 LAB — CUP PACEART REMOTE DEVICE CHECK
Battery Remaining Longevity: 1 mo
Battery Remaining Percentage: 2 %
Battery Voltage: 2.69 V
Brady Statistic AP VP Percent: 27 %
Brady Statistic AP VS Percent: 1 %
Brady Statistic AS VP Percent: 73 %
Brady Statistic AS VS Percent: 1 %
Brady Statistic RA Percent Paced: 27 %
Date Time Interrogation Session: 20230615003025
Implantable Lead Implant Date: 20151220
Implantable Lead Implant Date: 20151220
Implantable Lead Implant Date: 20160819
Implantable Lead Location: 753858
Implantable Lead Location: 753859
Implantable Lead Location: 753860
Implantable Lead Model: 5076
Implantable Lead Model: 5076
Implantable Pulse Generator Implant Date: 20160819
Lead Channel Impedance Value: 390 Ohm
Lead Channel Impedance Value: 430 Ohm
Lead Channel Impedance Value: 430 Ohm
Lead Channel Pacing Threshold Amplitude: 0.625 V
Lead Channel Pacing Threshold Amplitude: 0.625 V
Lead Channel Pacing Threshold Amplitude: 1.125 V
Lead Channel Pacing Threshold Pulse Width: 0.5 ms
Lead Channel Pacing Threshold Pulse Width: 0.5 ms
Lead Channel Pacing Threshold Pulse Width: 0.8 ms
Lead Channel Sensing Intrinsic Amplitude: 12 mV
Lead Channel Sensing Intrinsic Amplitude: 3.1 mV
Lead Channel Setting Pacing Amplitude: 1.625
Lead Channel Setting Pacing Amplitude: 2 V
Lead Channel Setting Pacing Amplitude: 2.125
Lead Channel Setting Pacing Pulse Width: 0.5 ms
Lead Channel Setting Pacing Pulse Width: 0.8 ms
Lead Channel Setting Sensing Sensitivity: 2 mV
Pulse Gen Model: 3262
Pulse Gen Serial Number: 3099689

## 2022-01-05 ENCOUNTER — Ambulatory Visit (INDEPENDENT_AMBULATORY_CARE_PROVIDER_SITE_OTHER): Payer: No Typology Code available for payment source

## 2022-01-05 DIAGNOSIS — I5022 Chronic systolic (congestive) heart failure: Secondary | ICD-10-CM | POA: Diagnosis not present

## 2022-01-05 DIAGNOSIS — Z950 Presence of cardiac pacemaker: Secondary | ICD-10-CM | POA: Diagnosis not present

## 2022-01-07 NOTE — Progress Notes (Signed)
EPIC Encounter for ICM Monitoring  Patient Name: Alexander Curtis is a 76 y.o. male Date: 01/07/2022 Primary Care Physican: Rita Ohara, MD Primary Cardiologist: Harrington Challenger Electrophysiologist: Vergie Living Pacing:  >99%      08/18/2021 Weight: 216 lbs    Battery ERI: <3 months      Attempted call to patient and unable to reach.  Left detailed message per DPR regarding transmission. Transmission reviewed.    Corvue thoracic impedance suggesting normal fluid levels.   Prescribed: No diuretic   Recommendations:  Left voice mail with ICM number and encouraged to call if experiencing any fluid symptoms.   Follow-up plan: ICM clinic phone appointment on 02/09/2022.   91 day device clinic remote transmission 01/26/2022.   EP/Cardiology Office Visits:   Recall 03/18/2022 with Dr. Harrington Challenger.  Recall 12/27/2021 with Oda Kilts, PA.     Copy of ICM check sent to Dr. Caryl Comes.   3 month ICM trend: 01/05/2022.     [name], RN 01/07/2022 3:41 PM

## 2022-01-26 ENCOUNTER — Telehealth: Payer: Self-pay | Admitting: Internal Medicine

## 2022-01-26 ENCOUNTER — Ambulatory Visit (INDEPENDENT_AMBULATORY_CARE_PROVIDER_SITE_OTHER): Payer: No Typology Code available for payment source

## 2022-01-26 DIAGNOSIS — I441 Atrioventricular block, second degree: Secondary | ICD-10-CM

## 2022-01-26 NOTE — Telephone Encounter (Signed)
Pt states he is going to [place] in about a month and wants to know if his battery will withstand the whole time while he is gone.

## 2022-01-26 NOTE — Telephone Encounter (Signed)
Returned call to Pt and wife.  Discussed Pt's device nearing ERI and upcoming vacation in [place].  His device currently is showing <3 months to ERI.  Current voltage is 2.65 and ERI voltage 2.62.  They are not leaving for [place] until 03/25/2022 and will spend 3 weeks there.  Wife is very concerned about being in [place] and needing a battery change.  Made appt with Dr. Caryl Comes in mid August to reevaluate need for gen change.  Pt and wife thanked nurse for call back.

## 2022-01-28 LAB — CUP PACEART REMOTE DEVICE CHECK
Battery Remaining Longevity: 1 mo
Battery Remaining Percentage: 1 %
Battery Voltage: 2.65 V
Brady Statistic AP VP Percent: 27 %
Brady Statistic AP VS Percent: 1 %
Brady Statistic AS VP Percent: 73 %
Brady Statistic AS VS Percent: 1 %
Brady Statistic RA Percent Paced: 27 %
Date Time Interrogation Session: 20230717093026
Implantable Lead Implant Date: 20151220
Implantable Lead Implant Date: 20151220
Implantable Lead Implant Date: 20160819
Implantable Lead Location: 753858
Implantable Lead Location: 753859
Implantable Lead Location: 753860
Implantable Lead Model: 5076
Implantable Lead Model: 5076
Implantable Pulse Generator Implant Date: 20160819
Lead Channel Impedance Value: 390 Ohm
Lead Channel Impedance Value: 410 Ohm
Lead Channel Impedance Value: 410 Ohm
Lead Channel Pacing Threshold Amplitude: 0.75 V
Lead Channel Pacing Threshold Amplitude: 1.125 V
Lead Channel Pacing Threshold Amplitude: 1.625 V
Lead Channel Pacing Threshold Pulse Width: 0.5 ms
Lead Channel Pacing Threshold Pulse Width: 0.5 ms
Lead Channel Pacing Threshold Pulse Width: 0.8 ms
Lead Channel Sensing Intrinsic Amplitude: 11.7 mV
Lead Channel Sensing Intrinsic Amplitude: 2.4 mV
Lead Channel Setting Pacing Amplitude: 2 V
Lead Channel Setting Pacing Amplitude: 2.125
Lead Channel Setting Pacing Amplitude: 3.125
Lead Channel Setting Pacing Pulse Width: 0.5 ms
Lead Channel Setting Pacing Pulse Width: 0.8 ms
Lead Channel Setting Sensing Sensitivity: 2 mV
Pulse Gen Model: 3262
Pulse Gen Serial Number: 3099689

## 2022-02-09 ENCOUNTER — Ambulatory Visit (INDEPENDENT_AMBULATORY_CARE_PROVIDER_SITE_OTHER): Payer: No Typology Code available for payment source

## 2022-02-09 DIAGNOSIS — Z950 Presence of cardiac pacemaker: Secondary | ICD-10-CM

## 2022-02-09 DIAGNOSIS — I5022 Chronic systolic (congestive) heart failure: Secondary | ICD-10-CM | POA: Diagnosis not present

## 2022-02-12 ENCOUNTER — Telehealth: Payer: Self-pay

## 2022-02-12 NOTE — Telephone Encounter (Signed)
Remote ICM transmission received.  Attempted call to patient regarding ICM remote transmission and left detailed message per DPR.  Advised to return call for any fluid symptoms or questions. Next ICM remote transmission scheduled 03/17/2022.    

## 2022-02-12 NOTE — Progress Notes (Signed)
EPIC Encounter for ICM Monitoring  Patient Name: Alexander Curtis is a 76 y.o. male Date: 02/12/2022 Primary Care Physican: Rita Ohara, MD Primary Cardiologist: Harrington Challenger Electrophysiologist: Vergie Living Pacing:  >99%      08/18/2021 Weight: 216 lbs    Battery ERI: <3 months      Attempted call to patient and unable to reach.  Left detailed message per DPR regarding transmission. Transmission reviewed. .  Leaving for [place] 9/13   Corvue thoracic impedance suggesting normal fluid levels.     Prescribed: No diuretic   Recommendations:  Left voice mail with ICM number and encouraged to call if experiencing any fluid symptoms.   Follow-up plan: ICM clinic phone appointment on 03/17/2022.   91 day device clinic remote transmission 03/30/2022.   EP/Cardiology Office Visits:  02/26/2022 with Dr Caryl Comes to discuss battery change.  Recall 03/18/2022 with Dr. Harrington Challenger.  Recall 12/27/2021 with Oda Kilts, PA.     Copy of ICM check sent to Dr. Caryl Comes.   3 month ICM trend: 02/12/2022.    12-14 Month ICM trend:     [name], RN 02/12/2022 9:37 AM

## 2022-02-16 NOTE — Progress Notes (Signed)
Electrophysiology Office Note Date: 02/16/2022  ID:  Alexander, Curtis [birth year], MRN [id number]  PCP: Rita Ohara, MD Primary Cardiologist: Dorris Carnes, MD Electrophysiologist: Virl Axe, MD   CC: Pacemaker follow-up  Alexander Curtis is a 76 y.o. male seen today for Virl Axe, MD for routine electrophysiology followup.  Since last being seen in our clinic the patient reports doing well overall.  he denies chest pain, palpitations, dyspnea, PND, orthopnea, nausea, vomiting, dizziness, syncope, edema, weight gain, or early satiety.  Device History: SJM CRT-P, initial device implanted 07/01/14 >> LV lead/CRT upgrade 03/01/15  Past Medical History:  Diagnosis Date   Arthritis of shoulder    right (CT done 11/2018)   Atrial enlargement, left    Bradycardia 06/30/2014   Diabetes mellitus, type 2 (Alexander Curtis)    Diabetic peripheral neuropathy (Alexander Curtis) 01/24/2017   Diagnosed by Alexander Curtis, and prescribed gabapentin   HLD (hyperlipidemia)    Hypertension 05/2013   LBBB (left bundle branch block)    Melanoma ([place]) 1968   R arm   Mitral regurgitation    Mobitz type 2 second degree heart block    a. s/p Medtronic Adapta L model ADDRL 1 (serial number [id number]) pacemaker. 07/02/14 b.  upgrade to STJ CRTP 02/2015   Non-ischemic cardiomyopathy (Alexander Curtis)    a. felt to be 2/2 RV pacing   Obstructive sleep apnea    compliant with CPAP   Pacemaker    a. Medtronic Adapta L model ADDRL 1 (serial number [id number]) pacemaker.    Pulmonary nodules    noted on CT of shoulder 03/2016, up to 49m in size on CT chest 04/2016. repeat 1 year due to h/o melanoma   Syncope 2011   a. 2011 -negative workup except LBBB. thought to be vasovagal   Tricuspid regurgitation    Past Surgical History:  Procedure Laterality Date   EP IMPLANTABLE DEVICE N/A 03/01/2015   STJ CRTP upgrade by Dr ARayann Heman  FINGER TENDON REPAIR     R 2nd and 3rd finger   INGUINAL HERNIA REPAIR     bilateral   KNEE ARTHROSCOPY  02/2011   L  knee for meniscal tear. Dr. BNinfa Linden  PERMANENT PACEMAKER INSERTION N/A 07/01/2014   MDT ADDRL1 pacemaker implanted for Mobitz II Dr ARayann Heman  UVULECTOMY     laser treatment for sleep apnea (NOT UP3)   VASECTOMY      Current Outpatient Medications  Medication Sig Dispense Refill   aspirin 81 MG tablet Take 81 mg by mouth every other day.     carvedilol (COREG) 6.25 MG tablet TAKE 1 TABLET(6.25 MG) BY MOUTH TWICE DAILY WITH A MEAL 180 tablet 1   CINNAMON PO Take 2,000 mg by mouth 3 (three) times daily.      Coenzyme Q10 (COQ10) 200 MG CAPS Take 1 capsule by mouth 2 (two) times daily.      diphenhydrAMINE (BENADRYL) 50 MG tablet Take 50 mg by mouth as needed for allergies.     FIBER SELECT GUMMIES PO Take 1 each by mouth daily.     fish oil-omega-3 fatty acids 1000 MG capsule Take 1 g by mouth 3 (three) times daily.      gabapentin (NEURONTIN) 100 MG capsule Take 100 mg by mouth 2 (two) times daily.     lisinopril (PRINIVIL,ZESTRIL) 20 MG tablet Take 20 mg by mouth daily.     metFORMIN (GLUCOPHAGE) 500 MG tablet Take 500 mg by mouth daily with  breakfast.     Multiple Vitamins-Minerals (MENS 50+ MULTI VITAMIN/MIN PO) Take 1 tablet by mouth daily.     simvastatin (ZOCOR) 10 MG tablet Take 1 tablet by mouth at bedtime.     TURMERIC PO Take 1 tablet by mouth daily.     No current facility-administered medications for this visit.   Facility-Administered Medications Ordered in Other Visits  Medication Dose Route Frequency Provider Last Rate Last Admin   influenza  inactive virus vaccine (FLUZONE/FLUARIX) injection 0.5 mL  0.5 mL Intramuscular Once Rita Ohara, MD        Allergies:   Horse-derived products, Pravastatin, and Adhesive [tape]   Social History: Social History   Socioeconomic History   Marital status: Married    Spouse name: Not on file   Number of children: 4   Years of education: Not on file   Highest education level: Not on file  Occupational History   Occupation:  retired  Tobacco Use   Smoking status: Never   Smokeless tobacco: Never  Vaping Use   Vaping Use: Never used  Substance and Sexual Activity   Alcohol use: Yes    Comment: rare, some months without any   Drug use: No   Sexual activity: Yes    Partners: Female  Other Topics Concern   Not on file  Social History Narrative   Lives with wife, 1 dog.  Daughter, Alexander Curtis, in [place], [place] (Alexander Curtis) in [employer], son in [place], son in [place].10 grandchildren   Retired school principal   Sold his house and moved to a townhome near [place]. Has a mountain chalet.   [facility] ([facility] not even [facility]).   Social Determinants of Health   Financial Resource Strain: Not on file  Food Insecurity: Not on file  Transportation Needs: Not on file  Physical Activity: Not on file  Stress: Not on file  Social Connections: Not on file  Intimate Partner Violence: Not on file    Family History: Family History  Problem Relation Age of Onset   Dementia Mother    Cancer Mother        ?type, was told it contributed to death, per pt   Melanoma Mother 49   Stroke Father 50       cerebral aneurysm   Diabetes Sister        borderline, obese   Stroke Sister 12   Diabetes Maternal Grandmother    Heart disease Maternal Grandfather    Heart attack Maternal Grandfather    Dementia Paternal Aunt    Parkinson's disease Maternal Aunt    Hypertension Neg Hx      Review of Systems: All other systems reviewed and are otherwise negative except as noted above.  Physical Exam: There were no vitals filed for this visit.   GEN- The patient is well appearing, alert and oriented x 3 today.   HEENT: normocephalic, atraumatic; sclera clear, conjunctiva pink; hearing intact; oropharynx clear; neck supple  Lungs- Clear to ausculation bilaterally, normal work of breathing.  No wheezes, rales, rhonchi Heart- Regular rate and rhythm, no murmurs, rubs or gallops  GI- soft, non-tender, non-distended, bowel  sounds present  Extremities- no clubbing or cyanosis. No edema MS- no significant deformity or atrophy Skin- warm and dry, no rash or lesion; PPM pocket well healed Psych- euthymic mood, full affect Neuro- strength and sensation are intact  PPM Interrogation- reviewed in detail today,  See PACEART report  EKG:  EKG is not ordered today.  Recent Labs: No results found for requested labs within last 365 days.   Wt Readings from Last 3 Encounters:  09/04/21 219 lb 6.4 oz (99.5 kg)  07/09/21 219 lb 9.6 oz (99.6 kg)  06/30/21 219 lb 6.4 oz (99.5 kg)     Other studies Reviewed: Additional studies/ records that were reviewed today include: Previous EP office notes, Previous remote checks, Most recent labwork.   Assessment and Plan:  High-grade intermittent heart block   Pacemaker induced cardiomyopathy   Status post pacemaker with CRT upgrade   Right ventricular amplitude low with right ventricular under sensing and inappropriate ventricular pacing  Normal PPM function See Pace Art report No changes today At 2.62 V but has not yet triggered ERI of <2.62. Reviewed pt notifier today.  Continue coreg 6.25 mg BID and lisinopril 20 mg daily.   [place] and [place] 9[place]24. Even if he goes ERI today he will be safely in the window.  We will proactively schedule a gen change for October. It's likely that testing today will progress him to ERI within the next several days.  If he get's anxious about this, we discussed the possibility of gen change prior to his trip  Current medicines are reviewed at length with the patient today.     Disposition:   Follow up with Dr. Caryl Comes in as usual post gen change    Signed, Shirley Friar, PA-C  02/16/2022 10:25 AM  [facility] [address] [place] [address] [phone] (office) [phone] (fax)

## 2022-02-19 ENCOUNTER — Encounter (HOSPITAL_BASED_OUTPATIENT_CLINIC_OR_DEPARTMENT_OTHER): Payer: Self-pay | Admitting: Internal Medicine

## 2022-02-19 NOTE — Telephone Encounter (Signed)
Error

## 2022-02-23 ENCOUNTER — Ambulatory Visit (INDEPENDENT_AMBULATORY_CARE_PROVIDER_SITE_OTHER): Payer: No Typology Code available for payment source | Admitting: Student

## 2022-02-23 ENCOUNTER — Encounter: Payer: Self-pay | Admitting: Student

## 2022-02-23 VITALS — BP 104/68 | HR 68 | Ht 73.5 in | Wt 208.0 lb

## 2022-02-23 DIAGNOSIS — I441 Atrioventricular block, second degree: Secondary | ICD-10-CM | POA: Diagnosis not present

## 2022-02-23 DIAGNOSIS — Z950 Presence of cardiac pacemaker: Secondary | ICD-10-CM

## 2022-02-23 DIAGNOSIS — I5022 Chronic systolic (congestive) heart failure: Secondary | ICD-10-CM

## 2022-02-23 DIAGNOSIS — I447 Left bundle-branch block, unspecified: Secondary | ICD-10-CM | POA: Diagnosis not present

## 2022-02-23 LAB — CUP PACEART INCLINIC DEVICE CHECK
Battery Remaining Longevity: 0 mo
Battery Voltage: 2.62 V
Brady Statistic RA Percent Paced: 27 %
Brady Statistic RV Percent Paced: 99.74 %
Date Time Interrogation Session: 20230814085215
Implantable Lead Implant Date: 20151220
Implantable Lead Implant Date: 20151220
Implantable Lead Implant Date: 20160819
Implantable Lead Location: 753858
Implantable Lead Location: 753859
Implantable Lead Location: 753860
Implantable Lead Model: 5076
Implantable Lead Model: 5076
Implantable Pulse Generator Implant Date: 20160819
Lead Channel Impedance Value: 400 Ohm
Lead Channel Impedance Value: 400 Ohm
Lead Channel Impedance Value: 425 Ohm
Lead Channel Pacing Threshold Amplitude: 0.625 V
Lead Channel Pacing Threshold Amplitude: 1 V
Lead Channel Pacing Threshold Amplitude: 1 V
Lead Channel Pacing Threshold Amplitude: 1.375 V
Lead Channel Pacing Threshold Pulse Width: 0.5 ms
Lead Channel Pacing Threshold Pulse Width: 0.5 ms
Lead Channel Pacing Threshold Pulse Width: 0.5 ms
Lead Channel Pacing Threshold Pulse Width: 0.8 ms
Lead Channel Sensing Intrinsic Amplitude: 11.7 mV
Lead Channel Sensing Intrinsic Amplitude: 2.9 mV
Lead Channel Setting Pacing Amplitude: 2 V
Lead Channel Setting Pacing Amplitude: 2.375
Lead Channel Setting Pacing Amplitude: 3.125
Lead Channel Setting Pacing Pulse Width: 0.5 ms
Lead Channel Setting Pacing Pulse Width: 0.8 ms
Lead Channel Setting Sensing Sensitivity: 2 mV
Pulse Gen Model: 3262
Pulse Gen Serial Number: 3099689

## 2022-02-23 NOTE — Patient Instructions (Signed)
Medication Instructions:  Your physician recommends that you continue on your current medications as directed. Please refer to the Current Medication list given to you today.  *If you need a refill on your cardiac medications before your next appointment, please call your pharmacy*   Lab Work: None If you have labs (blood work) drawn today and your tests are completely normal, you will receive your results only by: [facility] (if you have MyChart) OR A paper copy in the mail If you have any lab test that is abnormal or we need to change your treatment, we will call you to review the results.   Follow-Up: At [facility], you and your health needs are our priority.  As part of our continuing mission to provide you with exceptional heart care, we have created designated Provider Care Teams.  These Care Teams include your primary Cardiologist (physician) and Advanced Practice Providers (APPs -  Physician Assistants and Nurse Practitioners) who all work together to provide you with the care you need, when you need it.  Your next appointment:   We will call you to schedule  Other Instructions See letter for Generator Change Instructions

## 2022-02-25 NOTE — Addendum Note (Signed)
Addended by: Douglass Rivers D on: 02/25/2022 01:08 PM   Modules accepted: Level of Service

## 2022-02-25 NOTE — Progress Notes (Signed)
Remote pacemaker transmission.   

## 2022-02-26 ENCOUNTER — Ambulatory Visit: Payer: No Typology Code available for payment source

## 2022-02-26 ENCOUNTER — Encounter: Payer: Medicare HMO | Admitting: Internal Medicine

## 2022-02-26 DIAGNOSIS — I428 Other cardiomyopathies: Secondary | ICD-10-CM

## 2022-02-27 ENCOUNTER — Telehealth: Payer: Self-pay | Admitting: Internal Medicine

## 2022-02-27 NOTE — Telephone Encounter (Signed)
Patient states he is returning a call from today

## 2022-02-27 NOTE — Telephone Encounter (Signed)
Spoke with pt's wife, DPR and advised RN does not see where anyone has tried to contact pt from this office today.  Pt's wife verbalizes understanding and thanked Therapist, sports for the phone call.

## 2022-03-02 LAB — CUP PACEART REMOTE DEVICE CHECK
Battery Remaining Longevity: 1 mo
Battery Remaining Percentage: 0.5 %
Battery Voltage: 2.62 V
Brady Statistic AP VP Percent: 24 %
Brady Statistic AP VS Percent: 1 %
Brady Statistic AS VP Percent: 76 %
Brady Statistic AS VS Percent: 1 %
Brady Statistic RA Percent Paced: 23 %
Date Time Interrogation Session: 20230818120236
Implantable Lead Implant Date: 20151220
Implantable Lead Implant Date: 20151220
Implantable Lead Implant Date: 20160819
Implantable Lead Location: 753858
Implantable Lead Location: 753859
Implantable Lead Location: 753860
Implantable Lead Model: 5076
Implantable Lead Model: 5076
Implantable Pulse Generator Implant Date: 20160819
Lead Channel Impedance Value: 390 Ohm
Lead Channel Impedance Value: 410 Ohm
Lead Channel Impedance Value: 410 Ohm
Lead Channel Pacing Threshold Amplitude: 0.625 V
Lead Channel Pacing Threshold Amplitude: 1.125 V
Lead Channel Pacing Threshold Amplitude: 1.625 V
Lead Channel Pacing Threshold Pulse Width: 0.5 ms
Lead Channel Pacing Threshold Pulse Width: 0.5 ms
Lead Channel Pacing Threshold Pulse Width: 0.8 ms
Lead Channel Sensing Intrinsic Amplitude: 11.7 mV
Lead Channel Sensing Intrinsic Amplitude: 3.1 mV
Lead Channel Setting Pacing Amplitude: 2 V
Lead Channel Setting Pacing Amplitude: 2.125
Lead Channel Setting Pacing Amplitude: 3.125
Lead Channel Setting Pacing Pulse Width: 0.5 ms
Lead Channel Setting Pacing Pulse Width: 0.8 ms
Lead Channel Setting Sensing Sensitivity: 2 mV
Pulse Gen Model: 3262
Pulse Gen Serial Number: 3099689

## 2022-03-04 ENCOUNTER — Telehealth: Payer: Self-pay

## 2022-03-04 NOTE — Telephone Encounter (Signed)
Informed patient his PPM has reached ERI/ patient already has generator replacement scheduled for 04/17/2022

## 2022-03-17 ENCOUNTER — Ambulatory Visit (INDEPENDENT_AMBULATORY_CARE_PROVIDER_SITE_OTHER): Payer: No Typology Code available for payment source

## 2022-03-17 DIAGNOSIS — I5022 Chronic systolic (congestive) heart failure: Secondary | ICD-10-CM

## 2022-03-17 DIAGNOSIS — Z950 Presence of cardiac pacemaker: Secondary | ICD-10-CM

## 2022-03-18 ENCOUNTER — Encounter: Payer: Self-pay | Admitting: Internal Medicine

## 2022-03-18 ENCOUNTER — Telehealth: Payer: Self-pay

## 2022-03-18 NOTE — Progress Notes (Signed)
EPIC Encounter for ICM Monitoring  Patient Name: Alexander Curtis is a 76 y.o. male Date: 03/18/2022 Primary Care Physican: Rita Ohara, MD Primary Cardiologist: Harrington Challenger Electrophysiologist: Vergie Living Pacing:  >99%      02/23/2022 Office weight: 208 lbs      Attempted call to patient and unable to reach.  Left detailed message per DPR regarding transmission. Transmission reviewed. .  Leaving for [place] 9/13.  Battery replacement scheduled for 10/6.   Corvue thoracic impedance suggesting normal fluid levels but was suggesting possible fluid accumulation from 8/13-8/23.   Prescribed: No diuretic   Recommendations:  Left voice mail with ICM number and encouraged to call if experiencing any fluid symptoms.   Follow-up plan: ICM clinic phone appointment on 05/25/2022 after 10/6 battery replacement.   91 day device clinic remote transmission 03/30/2022.   EP/Cardiology Office Visits:   Recall 03/18/2022 with Dr. Harrington Challenger.  Recall 12/27/2021 with Oda Kilts, PA.     Copy of ICM check sent to Dr. Caryl Comes.   3 month ICM trend: 03/17/2022.    12-14 Month ICM trend:     [name], RN 03/18/2022 3:14 PM

## 2022-03-18 NOTE — Telephone Encounter (Signed)
Remote ICM transmission received.  Attempted call to patient regarding ICM remote transmission and left detailed message per DPR.  Advised to return call for any fluid symptoms or questions. Next ICM remote transmission scheduled 06/01/2022 after battery replacement.

## 2022-03-26 NOTE — Progress Notes (Signed)
Remote ICD transmission.   

## 2022-03-26 NOTE — Addendum Note (Signed)
Addended by: Cheri Kearns A on: 03/26/2022 09:48 AM   Modules accepted: Level of Service

## 2022-04-07 ENCOUNTER — Ambulatory Visit (INDEPENDENT_AMBULATORY_CARE_PROVIDER_SITE_OTHER): Payer: No Typology Code available for payment source

## 2022-04-07 DIAGNOSIS — I441 Atrioventricular block, second degree: Secondary | ICD-10-CM | POA: Diagnosis not present

## 2022-04-07 LAB — CUP PACEART REMOTE DEVICE CHECK
Battery Remaining Longevity: 0 mo
Battery Voltage: 2.57 V
Brady Statistic AP VP Percent: 11 %
Brady Statistic AP VS Percent: 1 %
Brady Statistic AS VP Percent: 89 %
Brady Statistic AS VS Percent: 1 %
Brady Statistic RA Percent Paced: 11 %
Date Time Interrogation Session: 20230926094154
Implantable Lead Implant Date: 20151220
Implantable Lead Implant Date: 20151220
Implantable Lead Implant Date: 20160819
Implantable Lead Location: 753858
Implantable Lead Location: 753859
Implantable Lead Location: 753860
Implantable Lead Model: 5076
Implantable Lead Model: 5076
Implantable Pulse Generator Implant Date: 20160819
Lead Channel Impedance Value: 410 Ohm
Lead Channel Impedance Value: 430 Ohm
Lead Channel Impedance Value: 430 Ohm
Lead Channel Pacing Threshold Amplitude: 0.625 V
Lead Channel Pacing Threshold Amplitude: 0.75 V
Lead Channel Pacing Threshold Amplitude: 1 V
Lead Channel Pacing Threshold Pulse Width: 0.5 ms
Lead Channel Pacing Threshold Pulse Width: 0.5 ms
Lead Channel Pacing Threshold Pulse Width: 0.8 ms
Lead Channel Sensing Intrinsic Amplitude: 10.8 mV
Lead Channel Sensing Intrinsic Amplitude: 3.5 mV
Lead Channel Setting Pacing Amplitude: 1.625
Lead Channel Setting Pacing Amplitude: 2 V
Lead Channel Setting Pacing Amplitude: 2 V
Lead Channel Setting Pacing Pulse Width: 0.5 ms
Lead Channel Setting Pacing Pulse Width: 0.8 ms
Lead Channel Setting Sensing Sensitivity: 2 mV
Pulse Gen Model: 3262
Pulse Gen Serial Number: 3099689

## 2022-04-10 ENCOUNTER — Ambulatory Visit: Payer: No Typology Code available for payment source | Attending: Student

## 2022-04-10 DIAGNOSIS — I5022 Chronic systolic (congestive) heart failure: Secondary | ICD-10-CM

## 2022-04-10 DIAGNOSIS — I441 Atrioventricular block, second degree: Secondary | ICD-10-CM

## 2022-04-10 DIAGNOSIS — I447 Left bundle-branch block, unspecified: Secondary | ICD-10-CM

## 2022-04-10 DIAGNOSIS — Z950 Presence of cardiac pacemaker: Secondary | ICD-10-CM

## 2022-04-10 LAB — BASIC METABOLIC PANEL
BUN/Creatinine Ratio: 15 (ref 10–24)
BUN: 16 mg/dL (ref 8–27)
CO2: 25 mmol/L (ref 20–29)
Calcium: 9.5 mg/dL (ref 8.6–10.2)
Chloride: 104 mmol/L (ref 96–106)
Creatinine, Ser: 1.04 mg/dL (ref 0.76–1.27)
Glucose: 119 mg/dL — ABNORMAL HIGH (ref 70–99)
Potassium: 4.3 mmol/L (ref 3.5–5.2)
Sodium: 141 mmol/L (ref 134–144)
eGFR: 74 mL/min/{1.73_m2} (ref >59)

## 2022-04-10 LAB — CBC WITH DIFFERENTIAL/PLATELET
Basophils Absolute: 0 10*3/uL (ref 0.0–0.2)
Basos: 1 %
EOS (ABSOLUTE): 0.1 10*3/uL (ref 0.0–0.4)
Eos: 3 %
Hematocrit: 39.5 % (ref 37.5–51.0)
Hemoglobin: 13.3 g/dL (ref 13.0–17.7)
Immature Grans (Abs): 0 10*3/uL (ref 0.0–0.1)
Immature Granulocytes: 0 %
Lymphocytes Absolute: 0.7 10*3/uL (ref 0.7–3.1)
Lymphs: 18 %
MCH: 31.3 pg (ref 26.6–33.0)
MCHC: 33.7 g/dL (ref 31.5–35.7)
MCV: 93 fL (ref 79–97)
Monocytes Absolute: 0.7 10*3/uL (ref 0.1–0.9)
Monocytes: 18 %
Neutrophils Absolute: 2.2 10*3/uL (ref 1.4–7.0)
Neutrophils: 60 %
Platelets: 143 10*3/uL — ABNORMAL LOW (ref 150–450)
RBC: 4.25 x10E6/uL (ref 4.14–5.80)
RDW: 13.4 % (ref 11.6–15.4)
WBC: 3.7 10*3/uL (ref 3.4–10.8)

## 2022-04-16 NOTE — Pre-Procedure Instructions (Signed)
Instructed patient on the following items: Arrival time 0530 Nothing to eat or drink after midnight No meds AM of procedure Responsible person to drive you home and stay with you for 24 hrs Wash with special soap night before and morning of procedure  

## 2022-04-17 ENCOUNTER — Other Ambulatory Visit: Payer: Self-pay

## 2022-04-17 ENCOUNTER — Ambulatory Visit (HOSPITAL_COMMUNITY)
Admission: RE | Admit: 2022-04-17 | Discharge: 2022-04-17 | Disposition: A | Discharge status: Home / Self Care | Payer: No Typology Code available for payment source | Attending: Internal Medicine | Admitting: Internal Medicine

## 2022-04-17 ENCOUNTER — Encounter (HOSPITAL_COMMUNITY): Payer: Self-pay | Admitting: Internal Medicine

## 2022-04-17 ENCOUNTER — Encounter (HOSPITAL_COMMUNITY)
Admission: RE | Disposition: A | Discharge status: Home / Self Care | Payer: No Typology Code available for payment source | Source: Home / Self Care | Attending: Internal Medicine

## 2022-04-17 DIAGNOSIS — Z45010 Encounter for checking and testing of cardiac pacemaker pulse generator [battery]: Secondary | ICD-10-CM

## 2022-04-17 DIAGNOSIS — I442 Atrioventricular block, complete: Secondary | ICD-10-CM | POA: Diagnosis present

## 2022-04-17 DIAGNOSIS — I428 Other cardiomyopathies: Secondary | ICD-10-CM | POA: Diagnosis not present

## 2022-04-17 HISTORY — PX: PPM GENERATOR CHANGEOUT: EP1233

## 2022-04-17 LAB — GLUCOSE, CAPILLARY: Glucose-Capillary: 114 mg/dL — ABNORMAL HIGH (ref 70–99)

## 2022-04-17 SURGERY — PPM GENERATOR CHANGEOUT

## 2022-04-17 MED ORDER — SODIUM CHLORIDE 0.9 % IV SOLN
80.0000 mg | INTRAVENOUS | Status: AC
  Administered 2022-04-17: 80 mg

## 2022-04-17 MED ORDER — MIDAZOLAM HCL 5 MG/5ML IJ SOLN
INTRAMUSCULAR | Status: AC
  Filled 2022-04-17: qty 5

## 2022-04-17 MED ORDER — MIDAZOLAM HCL 5 MG/5ML IJ SOLN
INTRAMUSCULAR | Status: DC | PRN
  Administered 2022-04-17: 1 mg via INTRAVENOUS
  Administered 2022-04-17: 2 mg via INTRAVENOUS

## 2022-04-17 MED ORDER — POVIDONE-IODINE 10 % EX SWAB
2.0000 | Freq: Once | CUTANEOUS | Status: AC
  Administered 2022-04-17: 2 via TOPICAL

## 2022-04-17 MED ORDER — SODIUM CHLORIDE 0.9 % IV SOLN
INTRAVENOUS | Status: AC
  Filled 2022-04-17: qty 2

## 2022-04-17 MED ORDER — SODIUM CHLORIDE 0.9 % IV SOLN: INTRAVENOUS | Status: AC

## 2022-04-17 MED ORDER — CHLORHEXIDINE GLUCONATE 4 % EX LIQD: 4.0000 | Freq: Once | CUTANEOUS | Status: DC

## 2022-04-17 MED ORDER — SODIUM CHLORIDE 0.9 % IV SOLN: INTRAVENOUS | Status: DC

## 2022-04-17 MED ORDER — CEFAZOLIN SODIUM-DEXTROSE 2-4 GM/100ML-% IV SOLN
INTRAVENOUS | Status: AC
  Filled 2022-04-17: qty 100

## 2022-04-17 MED ORDER — FENTANYL CITRATE (PF) 100 MCG/2ML IJ SOLN
INTRAMUSCULAR | Status: DC | PRN
  Administered 2022-04-17: 25 ug via INTRAVENOUS
  Administered 2022-04-17: 50 ug via INTRAVENOUS

## 2022-04-17 MED ORDER — LIDOCAINE HCL 1 % IJ SOLN
INTRAMUSCULAR | Status: AC
  Filled 2022-04-17: qty 60

## 2022-04-17 MED ORDER — CEFAZOLIN SODIUM-DEXTROSE 2-4 GM/100ML-% IV SOLN
2.0000 g | INTRAVENOUS | Status: AC
  Administered 2022-04-17: 2 g via INTRAVENOUS

## 2022-04-17 MED ORDER — ACETAMINOPHEN 325 MG PO TABS: 325.0000 mg | ORAL_TABLET | ORAL | Status: DC | PRN

## 2022-04-17 MED ORDER — LIDOCAINE HCL (PF) 1 % IJ SOLN
INTRAMUSCULAR | Status: DC | PRN
  Administered 2022-04-17: 60 mL

## 2022-04-17 MED ORDER — FENTANYL CITRATE (PF) 100 MCG/2ML IJ SOLN
INTRAMUSCULAR | Status: AC
  Filled 2022-04-17: qty 2

## 2022-04-17 SURGICAL SUPPLY — 8 items
CABLE SURGICAL S-101-97-12 (CABLE) ×1 IMPLANT
HEMOSTAT SURGICEL 2X4 FIBR (HEMOSTASIS) IMPLANT
PACEMAKER QUDR ALLR CRT PM3562 (Pacemaker) IMPLANT
PAD DEFIB RADIO PHYSIO CONN (PAD) ×1 IMPLANT
PMKR QUADRA ALLURE CRT PM3562 (Pacemaker) ×1 IMPLANT
POUCH AIGIS-R ANTIBACT ICD (Mesh General) ×1 IMPLANT
POUCH AIGIS-R ANTIBACT ICD LRG (Mesh General) IMPLANT
TRAY PACEMAKER INSERTION (PACKS) ×1 IMPLANT

## 2022-04-17 NOTE — H&P (Signed)
Patient Care Team: Rita Ohara, MD as PCP - General (Family Medicine) Fay Records, MD as PCP - Cardiology (Cardiology) Deboraha Sprang, MD as PCP - Electrophysiology (Cardiology)   HPI  Alexander Curtis is a 76 y.o. male admitted for device generator replacement  Prior patient of Dr. Greggory Brandy, who underwent pacing for syncope intermittent left bundle branch block and then symptomatic 2: 1 AV block.  Pacing was associated with rapid deterioration in LV function as noted below, and he then underwent CRT upgrade with interval recovery.   The patient denies chest pain, shortness of breath, nocturnal dyspnea, orthopnea or peripheral edema.  There have been no palpitations, lightheadedness or syncope.         DATE TEST EF    12/15 Echo  55-60 %    4/16 Echo   35-40 %    2/17 Echo   45-50 %    1/23 Echo  60-65% LAE                Records and Results Reviewed   Past Medical History:  Diagnosis Date   Arthritis of shoulder    right (CT done 11/2018)   Atrial enlargement, left    Bradycardia 06/30/2014   Diabetes mellitus, type 2 ([place])    Diabetic peripheral neuropathy ([place]) 01/24/2017   Diagnosed by [place], and prescribed gabapentin   HLD (hyperlipidemia)    Hypertension 05/2013   LBBB (left bundle branch block)    Melanoma ([place]) 1968   R arm   Mitral regurgitation    Mobitz type 2 second degree heart block    a. s/p Medtronic Adapta L model ADDRL 1 (serial number [id number]) pacemaker. 07/02/14 b.  upgrade to STJ CRTP 02/2015   Non-ischemic cardiomyopathy ([place])    a. felt to be 2/2 RV pacing   Obstructive sleep apnea    compliant with CPAP   Pacemaker    a. Medtronic Adapta L model ADDRL 1 (serial number [id number]) pacemaker.    Pulmonary nodules    noted on CT of shoulder 03/2016, up to 6m in size on CT chest 04/2016. repeat 1 year due to h/o melanoma   Syncope 2011   a. 2011 -negative workup except LBBB. thought to be vasovagal   Tricuspid regurgitation     Past  Surgical History:  Procedure Laterality Date   EP IMPLANTABLE DEVICE N/A 03/01/2015   STJ CRTP upgrade by Dr ARayann Heman  FINGER TENDON REPAIR     R 2nd and 3rd finger   INGUINAL HERNIA REPAIR     bilateral   KNEE ARTHROSCOPY  02/2011   L knee for meniscal tear. Dr. BNinfa Linden  PERMANENT PACEMAKER INSERTION N/A 07/01/2014   MDT ADDRL1 pacemaker implanted for Mobitz II Dr ARayann Heman  UVULECTOMY     laser treatment for sleep apnea (NOT UP3)   VASECTOMY      Current Facility-Administered Medications  Medication Dose Route Frequency Provider Last Rate Last Admin   0.9 %  sodium chloride infusion   Intravenous Continuous KDeboraha Sprang MD 50 mL/hr at 04/17/22 0606 New Bag at 04/17/22 0606   ceFAZolin (ANCEF) IVPB 2g/100 mL premix  2 g Intravenous On Call KDeboraha Sprang MD       chlorhexidine (HIBICLENS) 4 % liquid 4 Application  4 Application Topical Once KDeboraha Sprang MD       gentamicin (GARAMYCIN) 80 mg in sodium chloride 0.9 % 500 mL  irrigation  80 mg Irrigation On Call Deboraha Sprang, MD       Facility-Administered Medications Ordered in Other Encounters  Medication Dose Route Frequency Provider Last Rate Last Admin   influenza  inactive virus vaccine (FLUZONE/FLUARIX) injection 0.5 mL  0.5 mL Intramuscular Once Rita Ohara, MD        Allergies  Allergen Reactions   Horse-Derived Products Anaphylaxis    REACTION: Anaphlactic Reaction.  Can't take tetanus shot   Pravastatin Other (See Comments)    Joint pain    Adhesive [Tape] Itching and Rash      Social History   Tobacco Use   Smoking status: Never   Smokeless tobacco: Never  Vaping Use   Vaping Use: Never used  Substance Use Topics   Alcohol use: Yes    Comment: rare, some months without any   Drug use: No     Family History  Problem Relation Age of Onset   Dementia Mother    Cancer Mother        ?type, was told it contributed to death, per pt   Melanoma Mother 27   Stroke Father 43       cerebral aneurysm    Diabetes Sister        borderline, obese   Stroke Sister 40   Diabetes Maternal Grandmother    Heart disease Maternal Grandfather    Heart attack Maternal Grandfather    Dementia Paternal Aunt    Parkinson's disease Maternal Aunt    Hypertension Neg Hx      Current Meds  Medication Sig   atorvastatin (LIPITOR) 40 MG tablet Take 40 mg by mouth at bedtime.   carvedilol (COREG) 12.5 MG tablet Take 6.25 mg by mouth 2 (two) times daily.   CINNAMON PO Take 1,000 mg by mouth 2 (two) times daily.   Coenzyme Q10 (COQ10) 200 MG CAPS Take 200 mg by mouth 2 (two) times daily.   fish oil-omega-3 fatty acids 1000 MG capsule Take 1 g by mouth 3 (three) times daily.    fluticasone (FLONASE) 50 MCG/ACT nasal spray Place 1 spray into both nostrils daily as needed for allergies or rhinitis.   gabapentin (NEURONTIN) 100 MG capsule Take 100 mg by mouth 2 (two) times daily.   lisinopril (ZESTRIL) 10 MG tablet Take 10 mg by mouth daily.   metFORMIN (GLUCOPHAGE) 500 MG tablet Take 500 mg by mouth 2 (two) times daily with a meal.   Multiple Vitamins-Minerals (MENS 50+ MULTI VITAMIN/MIN PO) Take 1 tablet by mouth daily.   Turmeric 500 MG CAPS Take 500 mg by mouth daily.     Review of Systems negative except from HPI and PMH  Physical Exam BP 129/75   Pulse (!) 58   Resp 18   Ht 6' 1.5" (1.867 m)   Wt 95.6 kg   SpO2 94%   BMI 27.43 kg/m  Well developed and well nourished in no acute distress HENT normal E scleral and icterus clear Neck Supple JVP flat; carotids brisk and full Clear to ausculation Regular rate and rhythm, no murmurs gallops or rub Soft with active bowel sounds No clubbing cyanosis  Edema Alert and oriented, grossly normal motor and sensory function Skin Warm and Dry    Assessment and  Plan  High-grade intermittent heart block   Pacemaker induced cardiomyopathy   Status post pacemaker with CRT upgrade   Right ventricular amplitude low with right ventricular under  sensing and inappropriate ventricular pacing  Device at ERI--for replacement Will use pouch We have reviewed the benefits and risks of generator replacement.  These include but are not limited to lead fracture and infection.  The patient understands, agrees and is willing to proceed.

## 2022-04-17 NOTE — Discharge Instructions (Signed)

## 2022-04-20 NOTE — Progress Notes (Signed)
Remote pacemaker transmission.   

## 2022-04-21 ENCOUNTER — Encounter: Payer: Self-pay | Admitting: Internal Medicine

## 2022-04-22 ENCOUNTER — Telehealth: Payer: Self-pay | Admitting: Internal Medicine

## 2022-04-22 NOTE — Telephone Encounter (Signed)
Spoke with patient stated he was at [employer] at time of episode he stated after he got some water in him and a life saver he started to feel better within 5-10min patient is going to send a transmission, informed patient that I would only call him back IF there was something abnormal on the transmission otherwise no news is good news, patient voiced understanding.

## 2022-04-22 NOTE — Telephone Encounter (Signed)
Message received in [facility] Triage regarding Pt concern with near syncope.    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  Prior to calling patient, I reached out to [place] team to find out if any abnormal reports had been obtained?  [facility] Device team had already called, spoke to patient, and had patient send a remote transmission for assessment.  Device team will follow up if any abnormalities present.  See  Device team note.

## 2022-04-22 NOTE — Telephone Encounter (Signed)
Pt c/o Syncope: STAT if syncope occurred within 30 minutes and pt complains of lightheadedness High Priority if episode of passing out, completely, today or in last 24 hours   Did you pass out today? No    When is the last time you passed out? Back in like 2014  Has this occurred multiple times? Yes, before having pacemaker  Did you have any symptoms prior to passing out? Did not pass out, felt dizzy and nauseous  BP was 107/72 HR 68. Patient wanted to make office aware due to having pacemaker and being unsure if something should up from episode. Wife who is an Therapist, sports believes it is due to dehydration.

## 2022-04-29 NOTE — Progress Notes (Unsigned)
Cardiology Office Note Date:  04/29/2022  [name]:  Alexander Curtis, Alexander Curtis 03/16/1946, MRN [id number] PCP:  Rita Ohara, MD  Cardiologist:  Dr. Harrington Challenger Electrophysiologist: Dr. Rayann Heman >> Dr. Caryl Comes    Chief Complaint:   *** wound check  History of Present Illness: Alexander Curtis is a 76 y.o. male with history of DM, HTN, OSA w/CPAP, LBBB, NICM, chronic CHF (mixed), advanced heart block w/CRT-P.  He saw Dr. Rayann Heman, last seen by him in Nov 2018.  At that time, doing well, noted only short and rare disorganized atrial arrhythmia, no sustained AF and urged CPAP compliance.  No changes to his tx or programming were made at this visit.  I saw him dec 2019, he was feeling very well, no CP, palpitations or SOB.  He reported very good exertional capacity, but realized his age as well.  No dizziness, near syncope or syncope.  He denied symptoms of PND or orthopnea.  No changes were made to his medicines or programming   Dec 2020 He was found to have AFib via his PPM interrogation of nearly 10 hours, I was inclined to start a/c with CHA2DS2Vasc score of 4, though we would wait for his colonoscopy and he was agreeable but wanted Dr. Harrington Challenger (and/or Dr. Rayann Heman) to weigh in. I sent note to both, had an opportunity to discuss briefly with Dr. Harrington Challenger, she planned to review the chart and comment.    Jan 2021, I had f/u He feels well, no recurrent CP, remains very active physically, [place], palpitations or SOB Dr. Harrington Challenger also favored starting a/c, though the pt was again pending a possible repeat hemorrhoidal surgery, a tooth extraction, and pulmonary evaluation.  He was going to keep updated on these and any procedures with plans to start a/c.  Last saw Dr. Rayann Heman 06/30/21, low arrhythmia burden with plans to follw via his device.  Nearing ERI  EP care transitioned to Dr. Caryl Comes  He saw Dr. Caryl Comes on 09/04/21, observed Right ventricular amplitude low with right  ventricular under sensing and inappropriate ventricular pacing, disabled search function and adjusted PAV/SAV delays   He saw Jonni Sanger 02/23/22, feeling well, nearing ERI, planned for gen change once triggered  Underwent gen change 04/17/22  *** site *** symptoms *** volume *** Dr. Harrington Challenger? Last Dec 2022 *** AF burden *** CPAP?  Device information: SJM CRT-P, initial device implanted 07/01/14 >> LV lead/CRT upgrade 03/01/15, gen change 04/17/22  Past Medical History:  Diagnosis Date   Arthritis of shoulder    right (CT done 11/2018)   Atrial enlargement, left    Bradycardia 06/30/2014   Diabetes mellitus, type 2 ([place])    Diabetic peripheral neuropathy ([place]) 01/24/2017   Diagnosed by [place], and prescribed gabapentin   HLD (hyperlipidemia)    Hypertension 05/2013   LBBB (left bundle branch block)    Melanoma ([place]) 1968   R arm   Mitral regurgitation    Mobitz type 2 second degree heart block    a. s/p Medtronic Adapta L model ADDRL 1 (serial number [id number]) pacemaker. 07/02/14 b.  upgrade to STJ CRTP 02/2015   Non-ischemic cardiomyopathy ([place])    a. felt to be 2/2 RV pacing   Obstructive sleep apnea    compliant with CPAP   Pacemaker    a. Medtronic Adapta L model ADDRL 1 (serial number [id number]) pacemaker.    Pulmonary nodules    noted on  CT of shoulder 03/2016, up to 22m in size on CT chest 04/2016. repeat 1 year due to h/o melanoma   Syncope 2011   a. 2011 -negative workup except LBBB. thought to be vasovagal   Tricuspid regurgitation     Past Surgical History:  Procedure Laterality Date   EP IMPLANTABLE DEVICE N/A 03/01/2015   STJ CRTP upgrade by Dr ARayann Heman  FINGER TENDON REPAIR     R 2nd and 3rd finger   INGUINAL HERNIA REPAIR     bilateral   KNEE ARTHROSCOPY  02/2011   L knee for meniscal tear. Dr. BNinfa Linden  PERMANENT PACEMAKER INSERTION N/A 07/01/2014   MDT ADDRL1 pacemaker implanted for Mobitz II Dr ARayann Heman  [facility] 04/17/2022   Procedure:  PPM GENERATOR CHANGEOUT;  Surgeon: KDeboraha Sprang MD;  [facility];  Service: Cardiovascular;  Laterality: N/A;   UVULECTOMY     laser treatment for sleep apnea (NOT UP3)   VASECTOMY      Current Outpatient Medications  Medication Sig Dispense Refill   atorvastatin (LIPITOR) 40 MG tablet Take 40 mg by mouth at bedtime.     carvedilol (COREG) 12.5 MG tablet Take 6.25 mg by mouth 2 (two) times daily.     carvedilol (COREG) 6.25 MG tablet TAKE 1 TABLET(6.25 MG) BY MOUTH TWICE DAILY WITH A MEAL (Patient not taking: Reported on 04/09/2022) 180 tablet 1   CINNAMON PO Take 1,000 mg by mouth 2 (two) times daily.     Coenzyme Q10 (COQ10) 200 MG CAPS Take 200 mg by mouth 2 (two) times daily.     fish oil-omega-3 fatty acids 1000 MG capsule Take 1 g by mouth 3 (three) times daily.      fluticasone (FLONASE) 50 MCG/ACT nasal spray Place 1 spray into both nostrils daily as needed for allergies or rhinitis.     gabapentin (NEURONTIN) 100 MG capsule Take 100 mg by mouth 2 (two) times daily.     lisinopril (ZESTRIL) 10 MG tablet Take 10 mg by mouth daily.     metFORMIN (GLUCOPHAGE) 500 MG tablet Take 500 mg by mouth 2 (two) times daily with a meal.     Multiple Vitamins-Minerals (MENS 50+ MULTI VITAMIN/MIN PO) Take 1 tablet by mouth daily.     Turmeric 500 MG CAPS Take 500 mg by mouth daily.     No current facility-administered medications for this visit.   Facility-Administered Medications Ordered in Other Visits  Medication Dose Route Frequency Provider Last Rate Last Admin   influenza  inactive virus vaccine (FLUZONE/FLUARIX) injection 0.5 mL  0.5 mL Intramuscular Once KRita Ohara MD        Allergies:   Horse-derived products, Pravastatin, and Adhesive [tape]   Social History:  The patient  reports that he has never smoked. He has never used smokeless tobacco. He reports current alcohol use. He reports that he does not use drugs.   Family History:  The patient's family history  includes Cancer in his mother; Dementia in his mother and paternal aunt; Diabetes in his maternal grandmother and sister; Heart attack in his maternal grandfather; Heart disease in his maternal grandfather; Melanoma (age of onset: 857 in his mother; Parkinson's disease in his maternal aunt; Stroke (age of onset: 643 in his father; Stroke (age of onset: 635 in his sister.  ROS:  Please see the history of present illness.    All other systems are reviewed and otherwise negative.   PHYSICAL EXAM:  VS:  There were no vitals taken for this visit. BMI: There is no height or weight on file to calculate BMI. Well nourished, well developed, in no acute distress  HEENT: normocephalic, atraumatic  Neck: no JVD, carotid bruits or masses Cardiac:  *** RRR; no significant murmurs, no rubs, or gallops Lungs: *** CTA b/l, no wheezing, rhonchi or rales  Abd: soft, nontender MS: no deformity or atrophy Ext: *** no edema  Skin: warm and dry, no rash Neuro:  No gross deficits appreciated Psych: euthymic mood, full affect  PPM site is stable, *** no tethering or discomfort   EKG:  not done today   PPM interrogation done today and reviewed by myself:  ***  07/28/21; TTE 1. Left ventricular ejection fraction, by estimation, is 60 to 65%. The  left ventricle has normal function. The left ventricle has no regional  wall motion abnormalities. There is mild left ventricular hypertrophy.  Left ventricular diastolic parameters  are consistent with Grade I diastolic dysfunction (impaired relaxation).   2. Right ventricular systolic function is normal. The right ventricular  size is normal. There is normal pulmonary artery systolic pressure. The  estimated right ventricular systolic pressure is 41.2 mmHg.   3. Left atrial size was moderately dilated.   4. Right atrial size was mildly dilated.   5. The mitral valve is normal in structure. Mild mitral valve  regurgitation. No evidence of mitral stenosis.   6.  The aortic valve is tricuspid. Aortic valve regurgitation is not  visualized. No aortic stenosis is present.   7. The inferior vena cava is normal in size with greater than 50%  respiratory variability, suggesting right atrial pressure of 3 mmHg.    08/16/15: TTE Study Conclusions - Left ventricle: The cavity size was mildly dilated. Wall   thickness was normal. Systolic function was mildly reduced. The   estimated ejection fraction was in the range of 45% to 50%.   Diffuse hypokinesis. Doppler parameters are consistent with   abnormal left ventricular relaxation (grade 1 diastolic   dysfunction). - Aortic valve: There was trivial regurgitation. - Mitral valve: There was mild regurgitation directed centrally. - Left atrium: The atrium was mildly dilated. - Pulmonary arteries: Systolic pressure was mildly increased. PA   peak pressure: 32 mm Hg (S). Impressions: - When compared to prior, there is mild improvement in EF (from   40%)  10/29/14 LVEF 35-40% 06/30/14: LVEF 55-60%  Recent Labs: 04/10/2022: BUN 16; Creatinine, Ser 1.04; Hemoglobin 13.3; Platelets 143; Potassium 4.3; Sodium 141  No results found for requested labs within last 365 days.   Estimated Creatinine Clearance: 69.3 mL/min (by C-G formula based on SCr of 1.04 mg/dL).   Wt Readings from Last 3 Encounters:  04/17/22 210 lb 12.8 oz (95.6 kg)  02/23/22 208 lb (94.3 kg)  09/04/21 219 lb 6.4 oz (99.5 kg)     Other studies reviewed: Additional studies/records reviewed today include: summarized above  ASSESSMENT AND PLAN:  1. H/o advanced heart block, PPM >> CRT-P     ***  2. NICM 3. Chronic CHF (systolic), NYHA I     LVEF normalized by last echo     *** No symptoms or exam findings to suggest volume OL     *** CorVue      *** On BB, ACE     *** C/w Dr. Harrington Challenger      4. HTN      *** No changes today  5. Paroxysmal Afib     He has had historically rare disorganized atrial arrhythmia     In Aug 2020  he had one episode of nearly 10 hours of AFib, pt reluctant to start a/c     His CHA2DS2Vasc score is 4     ***      Disposition:  continue remotes, sees Dr. Harrington Challenger next month, plan for EP in a year, sooner if needed   Current medicines are reviewed at length with the patient today.  The patient did not have any concerns regarding medicines.  Venetia Night, PA-C 04/29/2022 12:27 PM     [address] [place] [address] [phone] (office)  [phone] (fax)

## 2022-04-30 ENCOUNTER — Encounter: Payer: Self-pay | Admitting: Physician Assistant

## 2022-04-30 ENCOUNTER — Ambulatory Visit: Payer: No Typology Code available for payment source | Attending: Physician Assistant | Admitting: Physician Assistant

## 2022-04-30 VITALS — BP 124/74 | HR 63 | Ht 73.5 in | Wt 209.0 lb

## 2022-04-30 DIAGNOSIS — Z5189 Encounter for other specified aftercare: Secondary | ICD-10-CM

## 2022-04-30 DIAGNOSIS — Z950 Presence of cardiac pacemaker: Secondary | ICD-10-CM

## 2022-04-30 DIAGNOSIS — I428 Other cardiomyopathies: Secondary | ICD-10-CM

## 2022-04-30 DIAGNOSIS — I5022 Chronic systolic (congestive) heart failure: Secondary | ICD-10-CM

## 2022-04-30 DIAGNOSIS — I480 Paroxysmal atrial fibrillation: Secondary | ICD-10-CM | POA: Diagnosis not present

## 2022-04-30 DIAGNOSIS — R55 Syncope and collapse: Secondary | ICD-10-CM

## 2022-04-30 LAB — CUP PACEART INCLINIC DEVICE CHECK
Battery Remaining Longevity: 79 mo
Battery Voltage: 3.04 V
Brady Statistic RA Percent Paced: 25 %
Brady Statistic RV Percent Paced: 99.5 %
Date Time Interrogation Session: 20231019181729
Implantable Lead Implant Date: 20151220
Implantable Lead Implant Date: 20151220
Implantable Lead Implant Date: 20160819
Implantable Lead Location: 753858
Implantable Lead Location: 753859
Implantable Lead Location: 753860
Implantable Lead Model: 5076
Implantable Lead Model: 5076
Implantable Pulse Generator Implant Date: 20231006
Lead Channel Impedance Value: 400 Ohm
Lead Channel Impedance Value: 400 Ohm
Lead Channel Impedance Value: 400 Ohm
Lead Channel Pacing Threshold Amplitude: 0.5 V
Lead Channel Pacing Threshold Amplitude: 0.5 V
Lead Channel Pacing Threshold Amplitude: 0.75 V
Lead Channel Pacing Threshold Amplitude: 0.75 V
Lead Channel Pacing Threshold Amplitude: 1.125 V
Lead Channel Pacing Threshold Pulse Width: 0.5 ms
Lead Channel Pacing Threshold Pulse Width: 0.5 ms
Lead Channel Pacing Threshold Pulse Width: 0.5 ms
Lead Channel Pacing Threshold Pulse Width: 0.5 ms
Lead Channel Pacing Threshold Pulse Width: 0.5 ms
Lead Channel Sensing Intrinsic Amplitude: 12 mV
Lead Channel Sensing Intrinsic Amplitude: 3.1 mV
Lead Channel Setting Pacing Amplitude: 2.125
Lead Channel Setting Pacing Amplitude: 2.5 V
Lead Channel Setting Pacing Amplitude: 2.5 V
Lead Channel Setting Pacing Pulse Width: 0.5 ms
Lead Channel Setting Pacing Pulse Width: 0.5 ms
Lead Channel Setting Sensing Sensitivity: 2 mV
Pulse Gen Model: 3562
Pulse Gen Serial Number: 8109412

## 2022-04-30 NOTE — Patient Instructions (Addendum)
Medication Instructions:   Your physician recommends that you continue on your current medications as directed. Please refer to the Current Medication list given to you today.    *If you need a refill on your cardiac medications before your next appointment, please call your pharmacy*   Lab Work:  [place]   If you have labs (blood work) drawn today and your tests are completely normal, you will receive your results only by: [place] (if you have MyChart) OR A paper copy in the mail If you have any lab test that is abnormal or we need to change your treatment, we will call you to review the results.   Testing/Procedures:  NONE ORDERED  TODAY     Follow-Up: At [facility], you and your health needs are our priority.  As part of our continuing mission to provide you with exceptional heart care, we have created designated Provider Care Teams.  These Care Teams include your primary Cardiologist (physician) and Advanced Practice Providers (APPs -  Physician Assistants and Nurse Practitioners) who all work together to provide you with the care you need, when you need it.  We recommend signing up for the patient portal called "MyChart".  Sign up information is provided on this After Visit Summary.  MyChart is used to connect with patients for Virtual Visits (Telemedicine).  Patients are able to view lab/test results, encounter notes, upcoming appointments, etc.  Non-urgent messages can be sent to your provider as well.   To learn more about what you can do with MyChart, go to [email or web address].    Your next appointment:  DR. Harrington Challenger IN DECEMBER     AS SCHEDULED    The format for your next appointment:    In Person  Provider:   Virl Axe, MD    Other Instructions  Important Information About [place]

## 2022-05-11 ENCOUNTER — Encounter: Payer: Self-pay | Admitting: *Deleted

## 2022-05-25 ENCOUNTER — Ambulatory Visit (INDEPENDENT_AMBULATORY_CARE_PROVIDER_SITE_OTHER): Payer: No Typology Code available for payment source

## 2022-05-25 DIAGNOSIS — Z950 Presence of cardiac pacemaker: Secondary | ICD-10-CM | POA: Diagnosis not present

## 2022-05-25 DIAGNOSIS — I5022 Chronic systolic (congestive) heart failure: Secondary | ICD-10-CM

## 2022-05-29 ENCOUNTER — Telehealth: Payer: Self-pay

## 2022-05-29 NOTE — Progress Notes (Signed)
EPIC Encounter for ICM Monitoring  Patient Name: Alexander Curtis is a 76 y.o. male Date: 05/29/2022 Primary Care Physican: Rita Ohara, MD Primary Cardiologist: Harrington Challenger Electrophysiologist: Vergie Living Pacing:  >99%      02/23/2022 Office weight: 208 lbs    Attempted call to patient and unable to reach.  Left detailed message per DPR regarding transmission. Transmission reviewed.    [place] thoracic impedance suggesting normal fluid levels.   Prescribed: No diuretic   Recommendations:  Left voice mail with ICM number and encouraged to call if experiencing any fluid symptoms.   Follow-up plan: ICM clinic phone appointment on 06/29/2022.   91 day device clinic remote transmission 07/17/2022.   EP/Cardiology Office Visits:  06/30/2022 with Dr. Harrington Challenger.  07/27/2022 with Dr Caryl Comes.     Copy of ICM check sent to Dr. Caryl Comes.   3 month ICM trend: 05/24/2022.    12-14 Month ICM trend:     [name], RN 05/29/2022 10:51 AM

## 2022-05-29 NOTE — Telephone Encounter (Signed)
Remote ICM transmission received.  Attempted call to patient regarding ICM remote transmission and left detailed message per DPR.  Advised to return call for any fluid symptoms or questions. Next ICM remote transmission scheduled 06/29/2022.

## 2022-06-09 ENCOUNTER — Telehealth: Payer: Self-pay | Admitting: Pulmonary Disease

## 2022-06-09 ENCOUNTER — Ambulatory Visit (HOSPITAL_COMMUNITY): Payer: Medicare HMO

## 2022-06-09 NOTE — Telephone Encounter (Signed)
Alexander Curtis with the [facility] call regarding patient who was denied his CT today at [facility] because they said he did not have authorization.  Please call to discuss further at [phone]

## 2022-06-09 NOTE — Telephone Encounter (Signed)
See other encounter  Closing this one

## 2022-06-09 NOTE — Telephone Encounter (Signed)
Kelly from the [facility] reached out to me directly. A request for service was sent on 03/11/2022 and the new authorization started 04/22/2022 through 12/16/2022 which covers CT scans. The authorization has been entered into the system. Can we get this CT scan rescheduled prior to his OV with Dr. Valeta Harms on 12/11?

## 2022-06-11 ENCOUNTER — Telehealth: Payer: Self-pay | Admitting: Pulmonary Disease

## 2022-06-11 NOTE — Telephone Encounter (Signed)
Patient has been scheduled for 12/8 at 7:30 & has been made aware.  Nothing further needed at this time.

## 2022-06-19 ENCOUNTER — Ambulatory Visit (HOSPITAL_COMMUNITY)
Admission: RE | Admit: 2022-06-19 | Discharge: 2022-06-19 | Disposition: A | Discharge status: Home / Self Care | Payer: No Typology Code available for payment source | Source: Ambulatory Visit | Attending: Pulmonary Disease | Admitting: Pulmonary Disease

## 2022-06-19 DIAGNOSIS — R918 Other nonspecific abnormal finding of lung field: Secondary | ICD-10-CM

## 2022-06-22 ENCOUNTER — Telehealth: Payer: Self-pay | Admitting: Internal Medicine

## 2022-06-22 ENCOUNTER — Encounter: Payer: Self-pay | Admitting: Pulmonary Disease

## 2022-06-22 ENCOUNTER — Telehealth: Payer: Self-pay | Admitting: Pulmonary Disease

## 2022-06-22 ENCOUNTER — Ambulatory Visit (INDEPENDENT_AMBULATORY_CARE_PROVIDER_SITE_OTHER): Payer: No Typology Code available for payment source | Admitting: Pulmonary Disease

## 2022-06-22 VITALS — BP 122/62 | HR 68 | Ht 73.0 in | Wt 210.8 lb

## 2022-06-22 DIAGNOSIS — Z789 Other specified health status: Secondary | ICD-10-CM | POA: Diagnosis not present

## 2022-06-22 DIAGNOSIS — Z85820 Personal history of malignant melanoma of skin: Secondary | ICD-10-CM | POA: Diagnosis not present

## 2022-06-22 DIAGNOSIS — R918 Other nonspecific abnormal finding of lung field: Secondary | ICD-10-CM | POA: Diagnosis not present

## 2022-06-22 DIAGNOSIS — E041 Nontoxic single thyroid nodule: Secondary | ICD-10-CM | POA: Diagnosis not present

## 2022-06-22 NOTE — Patient Instructions (Signed)
Thank you for visiting Dr. Valeta Harms at [facility]. Today we recommend the following:  Orders Placed This Encounter  Procedures   US THYROID   Follow up with PCP after thyroid US  Return if symptoms worsen or fail to improve.    Please do your part to reduce the spread of COVID-19.

## 2022-06-22 NOTE — Telephone Encounter (Signed)
Pt returning nurse's call. Please advise

## 2022-06-22 NOTE — Telephone Encounter (Signed)
Received call report from [place] with [facility] on patient's CT Chest done on 06/19/22. Dr. Valeta Harms please review the result/impression copied below:    IMPRESSION: 1. Two left lower lobe pulmonary nodules measuring up to 5 mm. No follow-up needed if patient is low-risk (and has no known or suspected primary neoplasm). Non-contrast chest CT can be considered in 12 months if patient is high-risk. This recommendation follows the consensus statement: Guidelines for Management of Incidental Pulmonary Nodules Detected on CT Images: From the Fleischner Society 2017; Radiology 2017; 284:228-243. 2. A 2.2 cm right thyroid nodule (2:26) containing punctate calcifications. Thyroid ultrasound is recommended for further evaluation. 3. Coronary artery calcifications. Aortic Atherosclerosis (ICD10-I70.0).  Please advise, thank you.

## 2022-06-22 NOTE — Telephone Encounter (Signed)
See other open encounter.  

## 2022-06-22 NOTE — Telephone Encounter (Signed)
Attempted phone call to pt and left voicemail message to contact triage at [phone]. 

## 2022-06-22 NOTE — Telephone Encounter (Signed)
STAT if patient feels like he/she is going to faint   Are you dizzy now? no  Do you feel faint or have you passed out? Yes   Do you have any other symptoms? no  Have you checked your HR and BP (record if available)? 96/60; 117/60

## 2022-06-22 NOTE — Telephone Encounter (Signed)
Pts wife called to report that he has had 2 episodes of presyncope... his last was this past Sunday and Dr Harrell Gave was there to assist them.... his BP has dropping... it happened at noon yesterday after eating and was in church.   I made him an appt for Gerrianne Scale PA 06/23/22... he has been drinking added fluids... they will send a remote transmission tonight to be sure no issues with his pacemaker prior to coming in.

## 2022-06-22 NOTE — Progress Notes (Signed)
The whole point  Synopsis: Referred in December 2022 for lung nodule by Rita Ohara, MD  Subjective:   [name]: Alexander Curtis GENDER: male DOB: 12/05/1945, MRN: [id number]  Chief Complaint  Patient presents with   Follow-up    Follow-up    This is a 76 year old gentleman, past medical history of nonischemic cardiomyopathy, hypertension, hyperlipidemia, pacemaker placement.  Recent skin cancer removal of the right lower extremity anterior shin.  Melanoma of the right forearm removed in 1968 after he returned from [place].  Patient is referred from the [facility] in [place].  He was seen by Dr. Buck Mam several years ago with a groundglass opacity in the chest which recommended ongoing follow-up for CT imaging.  Patient had a CT scan of the chest in October 2021 as well as a follow-up CT scan in November 2022.  This showed persistent right lower lobe small groundglass lesion.  She he has a stable left lower lobe pulmonary nodules 2 of which are in the left lower lobe that have not changed and have been considered benign for follow-up since 2018.  We reviewed the CTs today in the office in our PACS system with the patient.  OV 06/22/2022: Here today for follow-up after recent CT scan of the chest regarding pulmonary nodules.  Medical history as described above.Patient had a repeat CT chest complete on 06/19/2022 which shows 2 left lower lobe pulmonary nodules measuring 5 mm in size with no need for follow-up.  Except for high risk nodules.  He has a 2.2 cm right thyroid nodule.  That will need thyroid ultrasound follow-up.  Patient has no respiratory complaints today.  Reviewed his CT imaging today in the office.      Past Medical History:  Diagnosis Date   Arthritis of shoulder    right (CT done 11/2018)   Atrial enlargement, left    Bradycardia 06/30/2014   Diabetes mellitus, type 2 ([place])    Diabetic peripheral neuropathy ([place]) 01/24/2017   Diagnosed by [facility], and prescribed  gabapentin   HLD (hyperlipidemia)    Hypertension 05/2013   LBBB (left bundle branch block)    Melanoma ([place]) 1968   R arm   Mitral regurgitation    Mobitz type 2 second degree heart block    a. s/p Medtronic Adapta L model ADDRL 1 (serial number [id number]) pacemaker. 07/02/14 b.  upgrade to STJ CRTP 02/2015   Non-ischemic cardiomyopathy ([place])    a. felt to be 2/2 RV pacing   Obstructive sleep apnea    compliant with CPAP   Pacemaker    a. Medtronic Adapta L model ADDRL 1 (serial number [id number]) pacemaker.    Pulmonary nodules    noted on CT of shoulder 03/2016, up to 30m in size on CT chest 04/2016. repeat 1 year due to h/o melanoma   Syncope 2011   a. 2011 -negative workup except LBBB. thought to be vasovagal   Tricuspid regurgitation      Family History  Problem Relation Age of Onset   Dementia Mother    Cancer Mother        ?type, was told it contributed to death, per pt   Melanoma Mother 872  Stroke Father 688      cerebral aneurysm   Diabetes Sister        borderline, obese   Stroke Sister 645  Diabetes Maternal Grandmother    Heart disease Maternal Grandfather    Heart attack  Maternal Grandfather    Dementia Paternal Aunt    Parkinson's disease Maternal Aunt    Hypertension Neg Hx      Past Surgical History:  Procedure Laterality Date   EP IMPLANTABLE DEVICE N/A 03/01/2015   STJ CRTP upgrade by Dr Rayann Heman   FINGER TENDON REPAIR     R 2nd and 3rd finger   INGUINAL HERNIA REPAIR     bilateral   KNEE ARTHROSCOPY  02/2011   L knee for meniscal tear. [name]   PERMANENT PACEMAKER INSERTION N/A 07/01/2014   MDT ADDRL1 pacemaker implanted for Mobitz II Dr Rayann Heman   [facility] GENERATOR CHANGEOUT N/A 04/17/2022   Procedure: PPM GENERATOR CHANGEOUT;  Surgeon: Deboraha Sprang, MD;  Location: [facility];  Service: Cardiovascular;  Laterality: N/A;   UVULECTOMY     laser treatment for sleep apnea (NOT UP3)   VASECTOMY      Social History   Socioeconomic  History   Marital status: Married    Spouse name: Not on file   Number of children: 4   Years of education: Not on file   Highest education level: Not on file  Occupational History   Occupation: retired  Tobacco Use   Smoking status: Never   Smokeless tobacco: Never  Vaping Use   Vaping Use: Never used  Substance and Sexual Activity   Alcohol use: Yes    Comment: rare, some months without any   Drug use: No   Sexual activity: Yes    Partners: Female  Other Topics Concern   Not on file  Social History Narrative   Lives with wife, 1 dog.  Daughter, Junie Panning, in [place], [place] (Amy) in [place], son in [place], son in [place].10 grandchildren   Retired school principal   Sold his house and moved to a townhome near [place]. Has a mountain chalet.   [facility] even [facility]).   Social Determinants of Health   Financial Resource Strain: Not on file  Food Insecurity: Not on file  Transportation Needs: Not on file  Physical Activity: Not on file  Stress: Not on file  Social Connections: Not on file  Intimate Partner Violence: Not on file     Allergies  Allergen Reactions   Horse-Derived Products Anaphylaxis    REACTION: Anaphlactic Reaction.  Can't take tetanus shot   Pravastatin Other (See Comments)    Joint pain    Adhesive [Tape] Itching and Rash     Outpatient Medications Prior to Visit  Medication Sig Dispense Refill   atorvastatin (LIPITOR) 40 MG tablet Take 40 mg by mouth at bedtime.     carvedilol (COREG) 6.25 MG tablet TAKE 1 TABLET(6.25 MG) BY MOUTH TWICE DAILY WITH A MEAL 180 tablet 1   CINNAMON PO Take 1,000 mg by mouth 2 (two) times daily.     Coenzyme Q10 (COQ10) 200 MG CAPS Take 200 mg by mouth 2 (two) times daily.     fish oil-omega-3 fatty acids 1000 MG capsule Take 1 g by mouth 3 (three) times daily.      fluticasone (FLONASE) 50 MCG/ACT nasal spray Place 1 spray into both nostrils daily as needed for allergies or rhinitis.      gabapentin (NEURONTIN) 100 MG capsule Take 100 mg by mouth 2 (two) times daily.     lisinopril (ZESTRIL) 10 MG tablet Take 10 mg by mouth daily.     metFORMIN (GLUCOPHAGE) 500 MG tablet Take 500 mg by mouth  2 (two) times daily with a meal.     Multiple Vitamins-Minerals (MENS 50+ MULTI VITAMIN/MIN PO) Take 1 tablet by mouth daily.     Turmeric 500 MG CAPS Take 500 mg by mouth daily.     Facility-Administered Medications Prior to Visit  Medication Dose Route Frequency Provider Last Rate Last Admin   influenza  inactive virus vaccine (FLUZONE/FLUARIX) injection 0.5 mL  0.5 mL Intramuscular Once Rita Ohara, MD        Review of Systems  Constitutional:  Negative for chills, fever, malaise/fatigue and weight loss.  HENT:  Negative for hearing loss, sore throat and tinnitus.   Eyes:  Negative for blurred vision and double vision.  Respiratory:  Negative for cough, hemoptysis, sputum production, shortness of breath, wheezing and stridor.   Cardiovascular:  Negative for chest pain, palpitations, orthopnea, leg swelling and PND.  Gastrointestinal:  Negative for abdominal pain, constipation, diarrhea, heartburn, nausea and vomiting.  Genitourinary:  Negative for dysuria, hematuria and urgency.  Musculoskeletal:  Negative for joint pain and myalgias.  Skin:  Negative for itching and rash.  Neurological:  Negative for dizziness, tingling, weakness and headaches.  Endo/Heme/Allergies:  Negative for environmental allergies. Does not bruise/bleed easily.  Psychiatric/Behavioral:  Negative for depression. The patient is not nervous/anxious and does not have insomnia.   All other systems reviewed and are negative.    Objective:  Physical Exam Vitals reviewed.  Constitutional:      General: He is not in acute distress.    Appearance: He is well-developed.  HENT:     Head: Normocephalic and atraumatic.  Eyes:     General: No scleral icterus.    Conjunctiva/sclera: Conjunctivae normal.     Pupils:  Pupils are equal, round, and reactive to light.  Neck:     Vascular: No JVD.     Trachea: No tracheal deviation.  Cardiovascular:     Rate and Rhythm: Normal rate and regular rhythm.     Heart sounds: Normal heart sounds. No murmur heard. Pulmonary:     Effort: Pulmonary effort is normal. No tachypnea, accessory muscle usage or respiratory distress.     Breath sounds: Normal breath sounds. No stridor. No wheezing, rhonchi or rales.  Abdominal:     General: Bowel sounds are normal. There is no distension.     Palpations: Abdomen is soft.     Tenderness: There is no abdominal tenderness.  Musculoskeletal:        General: No tenderness.     Cervical back: Neck supple.  Lymphadenopathy:     Cervical: No cervical adenopathy.  Skin:    General: Skin is warm and dry.     Capillary Refill: Capillary refill takes less than 2 seconds.     Findings: No rash.  Neurological:     Mental Status: He is alert and oriented to person, place, and time.  Psychiatric:        Behavior: Behavior normal.      Vitals:   06/22/22 1126  BP: 122/62  Pulse: 68  SpO2: 96%  Weight: 210 lb 12.8 oz (95.6 kg)  Height: '6\' 1"'$  (1.854 m)    96% on RA BMI Readings from Last 3 Encounters:  06/22/22 27.81 kg/m  04/30/22 27.20 kg/m  04/17/22 27.43 kg/m   Wt Readings from Last 3 Encounters:  06/22/22 210 lb 12.8 oz (95.6 kg)  04/30/22 209 lb (94.8 kg)  04/17/22 210 lb 12.8 oz (95.6 kg)     CBC    Component  Value Date/Time   WBC 3.7 04/10/2022 1000   WBC 5.0 08/17/2016 1052   RBC 4.25 04/10/2022 1000   RBC 4.72 08/17/2016 1052   HGB 13.3 04/10/2022 1000   HCT 39.5 04/10/2022 1000   PLT 143 (L) 04/10/2022 1000   MCV 93 04/10/2022 1000   MCH 31.3 04/10/2022 1000   MCH 30.7 08/17/2016 1052   MCHC 33.7 04/10/2022 1000   MCHC 32.9 08/17/2016 1052   RDW 13.4 04/10/2022 1000   LYMPHSABS 0.7 04/10/2022 1000   MONOABS 700 08/17/2016 1052   EOSABS 0.1 04/10/2022 1000   BASOSABS 0.0 04/10/2022  1000     Chest Imaging: May 29, 2021: CT imaging reviewed in PACS system completed at [facility]. Stable left lower lobe pulmonary nodule, small right lower lobe groundglass opacity. The patient's images have been independently reviewed by me.    December 2023 CT chest: Stable pulmonary nodules. Resolution of groundglass opacity. The patient's images have been independently reviewed by me.    Pulmonary Functions Testing Results:     No data to display          FeNO:   Pathology:   Echocardiogram:   Heart Catheterization:     Assessment & Plan:     ICD-10-CM   1. Thyroid nodule  E04.1 US THYROID    2. Multiple pulmonary nodules  R91.8     3. Nonsmoker  Z78.9     4. History of melanoma  Z85.820       Discussion:  This is a 76 year old gentleman history of nonischemic cardiomyopathy status post pacemaker placement, history of melanoma on the right forearm in 1968 status post removal.  He has multiple pulmonary nodules small in size subcentimeter found stable since 2018.  However had a new groundglass opacity in the right lung with follow-up CT imaging in December 2023 that shows resolution.  Plan: No additional follow-up for lung nodules needed at this time. He does have a new thyroid nodule. We will place a referral for thyroid ultrasound as well as inform his PCP. He is can a follow-up with his PCP after thyroid ultrasound complete. Patient return to see [place] as needed.    Current Outpatient Medications:    atorvastatin (LIPITOR) 40 MG tablet, Take 40 mg by mouth at bedtime., Disp: , Rfl:    carvedilol (COREG) 6.25 MG tablet, TAKE 1 TABLET(6.25 MG) BY MOUTH TWICE DAILY WITH A MEAL, Disp: 180 tablet, Rfl: 1   CINNAMON PO, Take 1,000 mg by mouth 2 (two) times daily., Disp: , Rfl:    Coenzyme Q10 (COQ10) 200 MG CAPS, Take 200 mg by mouth 2 (two) times daily., Disp: , Rfl:    fish oil-omega-3 fatty acids 1000 MG capsule, Take 1 g by mouth 3 (three)  times daily. , Disp: , Rfl:    fluticasone (FLONASE) 50 MCG/ACT nasal spray, Place 1 spray into both nostrils daily as needed for allergies or rhinitis., Disp: , Rfl:    gabapentin (NEURONTIN) 100 MG capsule, Take 100 mg by mouth 2 (two) times daily., Disp: , Rfl:    lisinopril (ZESTRIL) 10 MG tablet, Take 10 mg by mouth daily., Disp: , Rfl:    metFORMIN (GLUCOPHAGE) 500 MG tablet, Take 500 mg by mouth 2 (two) times daily with a meal., Disp: , Rfl:    Multiple Vitamins-Minerals (MENS 50+ MULTI VITAMIN/MIN PO), Take 1 tablet by mouth daily., Disp: , Rfl:    Turmeric 500 MG CAPS, Take 500 mg by mouth daily., Disp: ,  Rfl:  No current facility-administered medications for this visit.  Facility-Administered Medications Ordered in Other Visits:    influenza  inactive virus vaccine (FLUZONE/FLUARIX) injection 0.5 mL, 0.5 mL, Intramuscular, Once, Rita Ohara, MD   Garner Nash, DO [facility] 06/22/2022 11:51 AM

## 2022-06-23 ENCOUNTER — Ambulatory Visit: Payer: Medicare HMO | Attending: Physician Assistant | Admitting: Physician Assistant

## 2022-06-23 ENCOUNTER — Encounter: Payer: Self-pay | Admitting: Physician Assistant

## 2022-06-23 VITALS — BP 130/72 | HR 60 | Ht 73.0 in | Wt 212.0 lb

## 2022-06-23 DIAGNOSIS — Z950 Presence of cardiac pacemaker: Secondary | ICD-10-CM

## 2022-06-23 DIAGNOSIS — R55 Syncope and collapse: Secondary | ICD-10-CM

## 2022-06-23 DIAGNOSIS — I428 Other cardiomyopathies: Secondary | ICD-10-CM | POA: Diagnosis not present

## 2022-06-23 DIAGNOSIS — I10 Essential (primary) hypertension: Secondary | ICD-10-CM

## 2022-06-23 DIAGNOSIS — I480 Paroxysmal atrial fibrillation: Secondary | ICD-10-CM

## 2022-06-23 NOTE — Progress Notes (Signed)
Cardiology Office Note:    Date:  06/23/2022   ID:  Alexander Curtis, DOB 08-24-1945, MRN [id number]  PCP:  Rita Ohara, [facility] Cardiologist:  Dorris Carnes, MD Electrophysiologist:  Virl Axe, MD     Referring MD: Rita Ohara, MD   Chief Complaint:  Dizziness     History of Present Illness:   Alexander Curtis is a 76 y.o. male with history of DM, HTN, OSA w/CPAP, LBBB, NICM, chronic CHF (mixed)-EF improved, advanced heart block w/CRT-P.  Generator change 04/17/22. PAF for 10 hrs 2020 pat reluctant to start a/c, chadsvasc 4 burden is zero on device so far.   Patient saw Ms. Charlcie Cradle 04/30/22 and had near syncope similar to his known vagal events in the past. He increased water intake and stabilized.  Patient added onto my schedule for 2 episodes of near syncope and hypotension. Remote device check was normal.  He was sitting in church-ashen, diaphoretic, BP low 80/60 -Dr. Harrell Gave was there to check him. This is the second episode. He had breakfast and protein drink but only drinks 24 ounces water daily. No caffeine. Power walks 2 miles/day.     Past Medical History:  Diagnosis Date   Arthritis of shoulder    right (CT done 11/2018)   Atrial enlargement, left    Bradycardia 06/30/2014   Diabetes mellitus, type 2 ([place])    Diabetic peripheral neuropathy ([place]) 01/24/2017   Diagnosed by [facility], and prescribed gabapentin   HLD (hyperlipidemia)    Hypertension 05/2013   LBBB (left bundle branch block)    Melanoma ([place]) 1968   R arm   Mitral regurgitation    Mobitz type 2 second degree heart block    a. s/p Medtronic Adapta L model ADDRL 1 (serial number [id number]) pacemaker. 07/02/14 b.  upgrade to STJ CRTP 02/2015   Non-ischemic cardiomyopathy ([place])    a. felt to be 2/2 RV pacing   Obstructive sleep apnea    compliant with CPAP   Pacemaker    a. Medtronic Adapta L model ADDRL 1 (serial number [id number]) pacemaker.    Pulmonary nodules    noted on CT of  shoulder 03/2016, up to 31m in size on CT chest 04/2016. repeat 1 year due to h/o melanoma   Syncope 2011   a. 2011 -negative workup except LBBB. thought to be vasovagal   Tricuspid regurgitation    Current Medications: Current Meds  Medication Sig   atorvastatin (LIPITOR) 40 MG tablet Take 40 mg by mouth at bedtime.   carvedilol (COREG) 6.25 MG tablet TAKE 1 TABLET(6.25 MG) BY MOUTH TWICE DAILY WITH A MEAL   CINNAMON PO Take 1,000 mg by mouth 2 (two) times daily.   Coenzyme Q10 (COQ10) 200 MG CAPS Take 200 mg by mouth 2 (two) times daily.   fish oil-omega-3 fatty acids 1000 MG capsule Take 1 g by mouth 3 (three) times daily.    fluticasone (FLONASE) 50 MCG/ACT nasal spray Place 1 spray into both nostrils daily as needed for allergies or rhinitis.   gabapentin (NEURONTIN) 100 MG capsule Take 100 mg by mouth 2 (two) times daily.   lisinopril (ZESTRIL) 10 MG tablet Take 10 mg by mouth daily.   Multiple Vitamins-Minerals (MENS 50+ MULTI VITAMIN/MIN PO) Take 1 tablet by mouth daily.   Turmeric 500 MG CAPS Take 500 mg by mouth daily.    Allergies:   Horse-derived products, Pravastatin, and Adhesive [tape]   Social History  Tobacco Use   Smoking status: Never   Smokeless tobacco: Never  Vaping Use   Vaping Use: Never used  Substance Use Topics   Alcohol use: Yes    Comment: rare, some months without any   Drug use: No    Family Hx: The patient's family history includes Cancer in his mother; Dementia in his mother and paternal aunt; Diabetes in his maternal grandmother and sister; Heart attack in his maternal grandfather; Heart disease in his maternal grandfather; Melanoma (age of onset: 74) in his mother; Parkinson's disease in his maternal aunt; Stroke (age of onset: 6) in his father; Stroke (age of onset: 41) in his sister. There is no history of Hypertension.  ROS     Physical Exam:    VS:  BP 130/72 (BP Location: Left Arm, Patient Position: Sitting)   Pulse 60   Ht '6\' 1"'$   (1.854 m)   Wt 212 lb (96.2 kg)   SpO2 97%   BMI 27.97 kg/m     Wt Readings from Last 3 Encounters:  06/23/22 212 lb (96.2 kg)  06/22/22 210 lb 12.8 oz (95.6 kg)  04/30/22 209 lb (94.8 kg)    Physical Exam  GEN: Well nourished, well developed, in no acute distress  HEENT: normal  Neck: no JVD, carotid bruits, or masses Cardiac:RRR; no murmurs, rubs, or gallops  Respiratory:  clear to auscultation bilaterally, normal work of breathing GI: soft, nontender, nondistended, + BS Ext: without cyanosis, clubbing, or edema, Good distal pulses bilaterally MS: no deformity or atrophy  Skin: warm and dry, no rash Neuro:  Alert and Oriented x 3, Strength and sensation are intact Psych: euthymic mood, full affect        EKGs/Labs/Other Test Reviewed:    EKG:  EKG is not ordered today.    Recent Labs: 04/10/2022: BUN 16; Creatinine, Ser 1.04; Hemoglobin 13.3; Platelets 143; Potassium 4.3; Sodium 141   Recent Lipid Panel No results for input(s): "CHOL", "TRIG", "HDL", "VLDL", "LDLCALC", "LDLDIRECT" in the last 8760 hours.   Prior CV Studies:   07/28/21; TTE 1. Left ventricular ejection fraction, by estimation, is 60 to 65%. The  left ventricle has normal function. The left ventricle has no regional  wall motion abnormalities. There is mild left ventricular hypertrophy.  Left ventricular diastolic parameters  are consistent with Grade I diastolic dysfunction (impaired relaxation).   2. Right ventricular systolic function is normal. The right ventricular  size is normal. There is normal pulmonary artery systolic pressure. The  estimated right ventricular systolic pressure is 21.3 mmHg.   3. Left atrial size was moderately dilated.   4. Right atrial size was mildly dilated.   5. The mitral valve is normal in structure. Mild mitral valve  regurgitation. No evidence of mitral stenosis.   6. The aortic valve is tricuspid. Aortic valve regurgitation is not  visualized. No aortic stenosis  is present.   7. The inferior vena cava is normal in size with greater than 50%  respiratory variability, suggesting right atrial pressure of 3 mmHg.      08/16/15: TTE Study Conclusions - Left ventricle: The cavity size was mildly dilated. Wall   thickness was normal. Systolic function was mildly reduced. The   estimated ejection fraction was in the range of 45% to 50%.   Diffuse hypokinesis. Doppler parameters are consistent with   abnormal left ventricular relaxation (grade 1 diastolic   dysfunction). - Aortic valve: There was trivial regurgitation. - Mitral valve: There was mild regurgitation  directed centrally. - Left atrium: The atrium was mildly dilated. - Pulmonary arteries: Systolic pressure was mildly increased. PA   peak pressure: 32 mm Hg (S). Impressions: - When compared to prior, there is mild improvement in EF (from   40%)   10/29/14 LVEF 35-40% 06/30/14: LVEF 55-60%     Risk Assessment/Calculations/Metrics:              ASSESSMENT & PLAN:   No problem-specific Assessment & Plan notes found for this encounter.   Near syncope with history of vasovagal presyncope and hypotension while sitting in church Sunday. Remote device check showed no arrhythmias. Not orthostatic in office today. Only drinking 24 ounces of water daily. Increase hydration to 60 ounces of water daily, compression hose. Has had some elevated BP's so won't decrease lisinopril or coreg at this time. They will monitor closely at home.  NICM with chronic combined CHF LVEF normalized on echo 07/2021 on BB, ACEI  ICM transmission 05/29/22 normal corvue and scheduled for 06/29/22  CRT-P-remote transmission yesterday no events  PAF 10 hrs 02/2019 pt reluctant to start a/c, chadsvasc=4, Zero burden on device so far  HTN-some elevated readings but with episodes of hypotension will continue same meds for now.            Dispo:  No follow-ups on file.   Medication Adjustments/Labs and Tests  Ordered: Current medicines are reviewed at length with the patient today.  Concerns regarding medicines are outlined above.  Tests Ordered: No orders of the defined types were placed in this encounter.  Medication Changes: No orders of the defined types were placed in this encounter.  Sumner Boast, PA-C  06/23/2022 10:15 AM    [address], [place], [place]  [address] Phone: [phone]; Fax: [phone]

## 2022-06-23 NOTE — Patient Instructions (Signed)
Medication Instructions:  Your physician recommends that you continue on your current medications as directed. Please refer to the Current Medication list given to you today.  *If you need a refill on your cardiac medications before your next appointment, please call your pharmacy*   Lab Work: None ordered   If you have labs (blood work) drawn today and your tests are completely normal, you will receive your results only by: [place] (if you have MyChart) OR A paper copy in the mail If you have any lab test that is abnormal or we need to change your treatment, we will call you to review the results.   Testing/Procedures: None ordered    Follow-Up: If you are feeling better within the next week you can call our office and cancel upcoming visit with Dr. Dorris Carnes  Other Instructions Increase your water intake to 60 oz a day

## 2022-06-23 NOTE — Telephone Encounter (Signed)
Remote transmission received and reviewed. Shows normal device function. No episodes noted. Forwarding to Estella Husk, PA-C to follow up on care.

## 2022-06-29 ENCOUNTER — Ambulatory Visit (INDEPENDENT_AMBULATORY_CARE_PROVIDER_SITE_OTHER): Payer: No Typology Code available for payment source

## 2022-06-29 DIAGNOSIS — Z950 Presence of cardiac pacemaker: Secondary | ICD-10-CM

## 2022-06-29 DIAGNOSIS — I5022 Chronic systolic (congestive) heart failure: Secondary | ICD-10-CM | POA: Diagnosis not present

## 2022-06-30 ENCOUNTER — Ambulatory Visit: Payer: No Typology Code available for payment source | Admitting: Internal Medicine

## 2022-07-01 NOTE — Progress Notes (Signed)
EPIC Encounter for ICM Monitoring  Patient Name: Alexander Curtis is a 76 y.o. male Date: 07/01/2022 Primary Care Physican: Rita Ohara, MD Primary Cardiologist: Harrington Challenger Electrophysiologist: Vergie Living Pacing:  >99%      06/23/2022 Office weight: 212 lbs    Spoke with wife/patient and heart failure questions reviewed.  Transmission results reviewed.  Pt asymptomatic for fluid accumulation.  Reports syncope episode on 12/11 and thought to be related to dehydration which correlates with impedance.   Corvue thoracic impedance normal but was suggesting possible dryness from 11/28-12/15.   Prescribed: No diuretic   Recommendations:  Recommended to drink around 64 oz of fluid a day.  Encouraged to call for any changes in condition.    Follow-up plan: ICM clinic phone appointment on 08/03/2022.   91 day device clinic remote transmission 07/17/2022.   EP/Cardiology Office Visits:   07/27/2022 with Dr Caryl Comes.     Copy of ICM check sent to Dr. Caryl Comes.   3 month ICM trend: 06/29/2022.    12-14 Month ICM trend:     [name], RN 07/01/2022 4:00 PM

## 2022-07-03 ENCOUNTER — Ambulatory Visit (HOSPITAL_BASED_OUTPATIENT_CLINIC_OR_DEPARTMENT_OTHER)
Admission: RE | Admit: 2022-07-03 | Discharge: 2022-07-03 | Disposition: A | Discharge status: Home / Self Care | Payer: Medicare HMO | Source: Ambulatory Visit | Attending: Pulmonary Disease | Admitting: Pulmonary Disease

## 2022-07-03 DIAGNOSIS — E041 Nontoxic single thyroid nodule: Secondary | ICD-10-CM | POA: Diagnosis not present

## 2022-07-03 NOTE — Progress Notes (Signed)
Please let patient know that he has a multinodular thyroid.  Follow-up with primary care regarding next steps.   Thanks,  BLI  Garner Nash, DO [facility] 07/03/2022 11:55 AM

## 2022-07-17 ENCOUNTER — Telehealth: Payer: Self-pay | Admitting: Internal Medicine

## 2022-07-17 ENCOUNTER — Ambulatory Visit (INDEPENDENT_AMBULATORY_CARE_PROVIDER_SITE_OTHER): Payer: No Typology Code available for payment source

## 2022-07-17 DIAGNOSIS — I428 Other cardiomyopathies: Secondary | ICD-10-CM

## 2022-07-17 NOTE — Telephone Encounter (Signed)
Patient reporting that his blood pressures have been trending up and that he has been experiencing headaches. [facility] reports [facility] it was 137/72 but later in the day his SBP was 160. He states he had a headache yesterday which was relieved with tylenol. He states that since he had his pacemaker replaced in October 2023, and since he experienced 2 sycopal episodes afterwards, he has not been feeling "right". He denies fever, SOB but does state he is feeling "flushed" at times and he thinks this is related to his blood pressure being trending higher since he has been complying with directions to drink 64 oz water every day to prevent syncopal episodes.  He states he has been taking his Coreg BID and his lisinopril 10 mg daily as ordered. He is asking if his blood pressure medication should be increased and if elevated blood pressures could be causing his headaches.

## 2022-07-17 NOTE — Telephone Encounter (Signed)
Pt c/o BP issue: STAT if pt c/o blurred vision, one-sided weakness or slurred speech  1. What are your last 5 BP readings?  Not available today  2. Are you having any other symptoms (ex. Dizziness, headache, blurred vision, passed out)?   Headache, blurred vision in left eye  3. What is your BP issue?   Patient stated he has been having episodes of syncopy where his BP readings was in the range of 157/80 and 151/90.  Patient stated he recovered from the episodes but is concerned his BP has been fluctuating high.

## 2022-07-20 LAB — CUP PACEART REMOTE DEVICE CHECK
Battery Remaining Longevity: 61 mo
Battery Remaining Percentage: 95.5 %
Battery Voltage: 2.99 V
Brady Statistic AP VP Percent: 29 %
Brady Statistic AP VS Percent: 1 %
Brady Statistic AS VP Percent: 70 %
Brady Statistic AS VS Percent: 1 %
Brady Statistic RA Percent Paced: 29 %
Date Time Interrogation Session: 20240105182957
Implantable Lead Connection Status: 753985
Implantable Lead Connection Status: 753985
Implantable Lead Connection Status: 753985
Implantable Lead Implant Date: 20151220
Implantable Lead Implant Date: 20151220
Implantable Lead Implant Date: 20160819
Implantable Lead Location: 753858
Implantable Lead Location: 753859
Implantable Lead Location: 753860
Implantable Lead Model: 5076
Implantable Lead Model: 5076
Implantable Pulse Generator Implant Date: 20231006
Lead Channel Impedance Value: 400 Ohm
Lead Channel Impedance Value: 400 Ohm
Lead Channel Impedance Value: 400 Ohm
Lead Channel Pacing Threshold Amplitude: 0.75 V
Lead Channel Pacing Threshold Amplitude: 1.5 V
Lead Channel Pacing Threshold Amplitude: 1.75 V
Lead Channel Pacing Threshold Pulse Width: 0.5 ms
Lead Channel Pacing Threshold Pulse Width: 0.5 ms
Lead Channel Pacing Threshold Pulse Width: 0.5 ms
Lead Channel Sensing Intrinsic Amplitude: 11 mV
Lead Channel Sensing Intrinsic Amplitude: 3.5 mV
Lead Channel Setting Pacing Amplitude: 2.5 V
Lead Channel Setting Pacing Amplitude: 2.5 V
Lead Channel Setting Pacing Amplitude: 2.75 V
Lead Channel Setting Pacing Pulse Width: 0.5 ms
Lead Channel Setting Pacing Pulse Width: 0.5 ms
Lead Channel Setting Sensing Sensitivity: 2 mV
Pulse Gen Model: 3562
Pulse Gen Serial Number: 8109412

## 2022-07-23 NOTE — Telephone Encounter (Signed)
Spoke with pt who is riding in a car.  Pt reports he is feeling much better.  He has been hydrating and reports no new syncopal episodes.  BP and HR over the past 4 days has been WNL.  Highest BP reading has been 131/73 and HR's ranging from 62-79.  PT ADVISED TO CONTINUE CURRENT MEDICATIONS AS PRESCRIBED AND CONTINUE TO MONITOR BP DAILY AND CONTACT THE OFFICE FOR ANY FURTHER CONCERNS.  Pt verbalizes understanding and thanked Therapist, sports for the call.

## 2022-07-27 ENCOUNTER — Encounter: Payer: No Typology Code available for payment source | Admitting: Internal Medicine

## 2022-07-30 NOTE — Telephone Encounter (Signed)
Noted Thanks SK   

## 2022-08-03 ENCOUNTER — Ambulatory Visit: Payer: No Typology Code available for payment source | Attending: Internal Medicine

## 2022-08-03 DIAGNOSIS — Z950 Presence of cardiac pacemaker: Secondary | ICD-10-CM | POA: Diagnosis not present

## 2022-08-03 DIAGNOSIS — I5022 Chronic systolic (congestive) heart failure: Secondary | ICD-10-CM | POA: Diagnosis not present

## 2022-08-03 NOTE — Progress Notes (Signed)
Remote pacemaker transmission.   

## 2022-08-05 ENCOUNTER — Telehealth: Payer: Self-pay

## 2022-08-05 NOTE — Progress Notes (Signed)
EPIC Encounter for ICM Monitoring  Patient Name: Alexander Curtis is a 77 y.o. male Date: 08/05/2022 Primary Care Physican: Rita Ohara, MD Primary Cardiologist: Harrington Challenger Electrophysiologist: Vergie Living Pacing:  >99%      06/23/2022 Office weight: 212 lbs    Attempted call to patient and unable to reach.   Transmission reviewed.    Corvue thoracic impedance normal but baseline reference and daily impedance have steadily been climbing since mid November.  Message sent to Dr Caryl Comes to evaluate latest Corvue trend at 1/31 OV.     Prescribed: No diuretic   Recommendations:  Unable to reach.     Follow-up plan: ICM clinic phone appointment on 09/07/2022.   91 day device clinic remote transmission 10/16/2022.   EP/Cardiology Office Visits:   08/12/2022 with Dr Caryl Comes.     Copy of ICM check sent to Dr. Caryl Comes.   3 month ICM trend: 08/03/2022.    12-14 Month ICM trend:     [name], RN 08/05/2022 12:54 PM

## 2022-08-05 NOTE — Progress Notes (Signed)
Spoke with patient and heart failure questions reviewed.  Transmission results reviewed.  Pt asymptomatic for fluid accumulation.  Reports feeling well at this time and voices no complaints.  He reports he is staying hydrated and drinking at least 4-50 oz fluid daily.  No changes and encouraged to call if experiencing any fluid symptoms.

## 2022-08-05 NOTE — Telephone Encounter (Signed)
Remote ICM transmission received.  Attempted call to patient regarding ICM remote transmission and no answer.  

## 2022-08-12 ENCOUNTER — Ambulatory Visit: Payer: No Typology Code available for payment source | Attending: Internal Medicine | Admitting: Internal Medicine

## 2022-08-12 ENCOUNTER — Encounter: Payer: Self-pay | Admitting: Internal Medicine

## 2022-08-12 VITALS — BP 110/62 | HR 64 | Ht 73.0 in | Wt 215.2 lb

## 2022-08-12 DIAGNOSIS — Z950 Presence of cardiac pacemaker: Secondary | ICD-10-CM

## 2022-08-12 DIAGNOSIS — I480 Paroxysmal atrial fibrillation: Secondary | ICD-10-CM | POA: Diagnosis not present

## 2022-08-12 DIAGNOSIS — I5022 Chronic systolic (congestive) heart failure: Secondary | ICD-10-CM

## 2022-08-12 DIAGNOSIS — I428 Other cardiomyopathies: Secondary | ICD-10-CM

## 2022-08-12 LAB — CUP PACEART INCLINIC DEVICE CHECK
Battery Remaining Longevity: 62 mo
Battery Voltage: 2.98 V
Brady Statistic RA Percent Paced: 28 %
Brady Statistic RV Percent Paced: 99.63 %
Date Time Interrogation Session: 20240131205921
Implantable Lead Connection Status: 753985
Implantable Lead Connection Status: 753985
Implantable Lead Connection Status: 753985
Implantable Lead Implant Date: 20151220
Implantable Lead Implant Date: 20151220
Implantable Lead Implant Date: 20160819
Implantable Lead Location: 753858
Implantable Lead Location: 753859
Implantable Lead Location: 753860
Implantable Lead Model: 5076
Implantable Lead Model: 5076
Implantable Pulse Generator Implant Date: 20231006
Lead Channel Impedance Value: 400 Ohm
Lead Channel Impedance Value: 425 Ohm
Lead Channel Impedance Value: 450 Ohm
Lead Channel Pacing Threshold Amplitude: 0.5 V
Lead Channel Pacing Threshold Amplitude: 0.5 V
Lead Channel Pacing Threshold Amplitude: 0.5 V
Lead Channel Pacing Threshold Amplitude: 0.75 V
Lead Channel Pacing Threshold Amplitude: 0.75 V
Lead Channel Pacing Threshold Amplitude: 1.5 V
Lead Channel Pacing Threshold Amplitude: 1.5 V
Lead Channel Pacing Threshold Pulse Width: 0.4 ms
Lead Channel Pacing Threshold Pulse Width: 0.5 ms
Lead Channel Pacing Threshold Pulse Width: 0.5 ms
Lead Channel Pacing Threshold Pulse Width: 0.5 ms
Lead Channel Pacing Threshold Pulse Width: 0.5 ms
Lead Channel Pacing Threshold Pulse Width: 1.2 ms
Lead Channel Pacing Threshold Pulse Width: 1.2 ms
Lead Channel Sensing Intrinsic Amplitude: 12 mV
Lead Channel Sensing Intrinsic Amplitude: 3.3 mV
Lead Channel Setting Pacing Amplitude: 2.5 V
Lead Channel Setting Pacing Amplitude: 2.5 V
Lead Channel Setting Pacing Amplitude: 2.5 V
Lead Channel Setting Pacing Pulse Width: 0.5 ms
Lead Channel Setting Pacing Pulse Width: 1.2 ms
Lead Channel Setting Sensing Sensitivity: 2 mV
Pulse Gen Model: 3562
Pulse Gen Serial Number: 8109412

## 2022-08-12 NOTE — Progress Notes (Signed)
Patient Care Team: Rita Ohara, MD as PCP - General (Family Medicine) Fay Records, MD as PCP - Cardiology (Cardiology) Deboraha Sprang, MD as PCP - Electrophysiology (Cardiology) Clinic, [facility]   HPI  Alexander Curtis is a 77 y.o. male Seen today, a prior patient of Dr. Greggory Brandy, who underwent pacing 2015 [employer] for syncope intermittent left bundle branch block and then symptomatic 2: 1 AV block.  Pacing was associated with rapid deterioration in LV function as noted below, and he then underwent upgrade 2016 with interval recovery.  Change 10/23, without concerns   When we saw him initially, it was noted that his pacing spike was in the middle of the QRS, we shorten the AV delay to force ventricular pacing with the ability to configure a resynchronize ECG  Episodes of recurrent presyncope without antecedent history of vasovagal syncope.  Prodrome is typically 5-8 minutes.  When at church she got up and walked out of the church, sat down in a chair and was aided by Dr. Harrell Gave, measurements  not been orthostatic  No dyspnea or chest pain   DATE TEST EF   12/15 Echo  55-60 %   4/16 Echo   35-40 %   2/17 Echo   45-50 %   1/23 Echo  60-65% LAE            Records and Results Reviewed    Past Medical History:  Diagnosis Date   Arthritis of shoulder    right (CT done 11/2018)   Atrial enlargement, left    Bradycardia 06/30/2014   Diabetes mellitus, type 2 ([place])    Diabetic peripheral neuropathy ([place]) 01/24/2017   Diagnosed by [place], and prescribed gabapentin   HLD (hyperlipidemia)    Hypertension 05/2013   LBBB (left bundle branch block)    Melanoma ([place]) 1968   R arm   Mitral regurgitation    Mobitz type 2 second degree heart block    a. s/p Medtronic Adapta L model ADDRL 1 (serial number [id number]) pacemaker. 07/02/14 b.  upgrade to STJ CRTP 02/2015   Non-ischemic cardiomyopathy ([place])    a. felt to be 2/2 RV pacing   Obstructive sleep apnea    compliant with  CPAP   Pacemaker    a. Medtronic Adapta L model ADDRL 1 (serial number [id number]) pacemaker.    Pulmonary nodules    noted on CT of shoulder 03/2016, up to 56m in size on CT chest 04/2016. repeat 1 year due to h/o melanoma   Syncope 2011   a. 2011 -negative workup except LBBB. thought to be vasovagal   Tricuspid regurgitation     Past Surgical History:  Procedure Laterality Date   EP IMPLANTABLE DEVICE N/A 03/01/2015   STJ CRTP upgrade by Dr ARayann Heman  FINGER TENDON REPAIR     R 2nd and 3rd finger   INGUINAL HERNIA REPAIR     bilateral   KNEE ARTHROSCOPY  02/2011   L knee for meniscal tear. Dr. BNinfa Linden  PERMANENT PACEMAKER INSERTION N/A 07/01/2014   MDT ADDRL1 pacemaker implanted for Mobitz II Dr ARayann Heman  [facility]   Procedure: PPM GENERATOR CHANGEOUT;  Surgeon: KDeboraha Sprang MD;  [facility];  Service: Cardiovascular;  Laterality: N/A;   UVULECTOMY     laser treatment for sleep apnea (NOT UP3)   VASECTOMY      Current Meds  Medication Sig  atorvastatin (LIPITOR) 40 MG tablet Take 40 mg by mouth at bedtime.   carvedilol (COREG) 6.25 MG tablet TAKE 1 TABLET(6.25 MG) BY MOUTH TWICE DAILY WITH A MEAL   CINNAMON PO Take 1,000 mg by mouth 2 (two) times daily.   Coenzyme Q10 (COQ10) 200 MG CAPS Take 200 mg by mouth 2 (two) times daily.   fish oil-omega-3 fatty acids 1000 MG capsule Take 1 g by mouth 3 (three) times daily.    fluticasone (FLONASE) 50 MCG/ACT nasal spray Place 1 spray into both nostrils daily as needed for allergies or rhinitis.   gabapentin (NEURONTIN) 100 MG capsule Take 100 mg by mouth 2 (two) times daily.   lisinopril (ZESTRIL) 10 MG tablet Take 10 mg by mouth daily.   metFORMIN (GLUCOPHAGE) 500 MG tablet Take 500 mg by mouth 2 (two) times daily with a meal.   Multiple Vitamins-Minerals (MENS 50+ MULTI VITAMIN/MIN PO) Take 1 tablet by mouth daily.   Turmeric 500 MG CAPS Take 500 mg by mouth daily.    Allergies   Allergen Reactions   Horse-Derived Products Anaphylaxis    REACTION: Anaphlactic Reaction.  Can't take tetanus shot   Pravastatin Other (See Comments)    Joint pain    Adhesive [Tape] Itching and Rash      Review of Systems negative except from HPI and PMH  Physical Exam BP 110/62   Pulse 64   Ht '6\' 1"'$  (1.854 m)   Wt 215 lb 3.2 oz (97.6 kg)   SpO2 94%   BMI 28.39 kg/m  Well developed and well nourished in no acute distress HENT normal Neck supple with JVP-flat Clear Device pocket well healed; without hematoma or erythema.  There is no tethering  Regular rate and rhythm, no  gallop No / murmur Abd-soft with active BS No Clubbing cyanosis  edema Skin-warm and dry A & Oriented  Grossly normal sensory and motor function  ECG sinus with P synchronous pacing competing with AV pacing Intervals 14/13/45 qR lead I RS lead V1 ECG was under gained  Device function isnormal. Programming changes RV output programmed to 2.5-1.5 with a threshold of 1.5 so the energy See Paceart for details    Reprogramming of the device to shorten the AV delay and forced ventricular pacing as opposed to search resulted in P synchronous pacing at 64 Interval 14/13/45 sQR in lead I and rS in lead V1  CrCl cannot be calculated (Patient's most recent lab result is older than the maximum 21 days allowed.).   Assessment and  Plan  High-grade intermittent heart block  Pacemaker induced cardiomyopathy  Status post pacemaker with CRT upgrade  Right ventricular amplitude low with right ventricular under sensing and inappropriate ventricular pacing  Syncope-recurrent-vasovagal  Hypertension    Resolved cardiomyopathy, we will continue him on carvedilol and lisinopril.  In the absence of symptoms, no clear indication for adding spironolactone.  Blood pressure is well-controlled, will continue with lisinopril and carvedilol.  No evidence of orthostasis  Discussed the physiology of vasovagal  syncope and the importance of becoming recumbent and gradually appears to be in a upright position so as to avoid the risk of recurrent orthostasis

## 2022-08-12 NOTE — Patient Instructions (Signed)
Medication Instructions:  Your physician recommends that you continue on your current medications as directed. Please refer to the Current Medication list given to you today.  *If you need a refill on your cardiac medications before your next appointment, please call your pharmacy*   Lab Work: None ordered.  If you have labs (blood work) drawn today and your tests are completely normal, you will receive your results only by: [place] (if you have MyChart) OR A paper copy in the mail If you have any lab test that is abnormal or we need to change your treatment, we will call you to review the results.   Testing/Procedures: None ordered.    Follow-Up: At [facility], you and your health needs are our priority.  As part of our continuing mission to provide you with exceptional heart care, we have created designated Provider Care Teams.  These Care Teams include your primary Cardiologist (physician) and Advanced Practice Providers (APPs -  Physician Assistants and Nurse Practitioners) who all work together to provide you with the care you need, when you need it.  We recommend signing up for the patient portal called "MyChart".  Sign up information is provided on this After Visit Summary.  MyChart is used to connect with patients for Virtual Visits (Telemedicine).  Patients are able to view lab/test results, encounter notes, upcoming appointments, etc.  Non-urgent messages can be sent to your provider as well.   To learn more about what you can do with MyChart, go to [email or web address].    Your next appointment:   9 months with Dr Caryl Comes

## 2022-08-18 DIAGNOSIS — M79642 Pain in left hand: Secondary | ICD-10-CM | POA: Diagnosis not present

## 2022-08-26 ENCOUNTER — Telehealth: Payer: Self-pay

## 2022-08-26 NOTE — Telephone Encounter (Signed)
Hello Dr. Caryl Comes,  This gentleman is seeking guidance on having an MRI of his hand with his current device. Can you advise on if his device is MRI compatible.  Thank you for your time and looking forward to your feedback.  Ambrose Pancoast, NP

## 2022-08-26 NOTE — Telephone Encounter (Signed)
   Pre-operative Risk Assessment    Patient Name: Alexander Curtis  DOB: 28-May-1946 MRN: [id number]      Request for Surgical Clearance    Procedure:   MRI on Left Hand  Date of Surgery:  Clearance TBD                                 Surgeon:  Dr Wadie Lessen Surgeon's Group or Practice Name:  [facility]  Phone number:  [phone] Fax number:  [phone]   Type of Clearance Requested:   - Medical    Type of Anesthesia:  None    Additional requests/questions:   patient has pacemaker  Signed, Jamelle Haring   08/26/2022, 2:10 PM

## 2022-09-01 NOTE — Telephone Encounter (Signed)
Alexander Curtis with [facility] is following up, requesting updates on clearance. Please return call to discuss MRI compatibility.

## 2022-09-01 NOTE — Telephone Encounter (Signed)
Routing to device clinic for compatibility question

## 2022-09-01 NOTE — Telephone Encounter (Signed)
I will forward this to pre op pool for review if this just needs to be sent to the device clinic as this is clearance for the device.

## 2022-09-02 NOTE — Telephone Encounter (Signed)
However if MRI needs to be done, we can make arrangements at [facility] They are willing to do these

## 2022-09-02 NOTE — Telephone Encounter (Signed)
Per review of Pt's chart.  Pt has an Risk analyst. Jude) [place] MP [phone].  LV lead is a SJ [place].  However, Pt's RA and RV lead are Medtronic 5076 Capsurefix Novus.    This is a MIXED PPM system.  It is not MRI compatible at [facility].

## 2022-09-07 ENCOUNTER — Ambulatory Visit: Payer: No Typology Code available for payment source | Attending: Internal Medicine

## 2022-09-07 DIAGNOSIS — I5022 Chronic systolic (congestive) heart failure: Secondary | ICD-10-CM | POA: Diagnosis not present

## 2022-09-07 DIAGNOSIS — Z950 Presence of cardiac pacemaker: Secondary | ICD-10-CM

## 2022-09-10 DIAGNOSIS — Z950 Presence of cardiac pacemaker: Secondary | ICD-10-CM | POA: Diagnosis not present

## 2022-09-11 NOTE — Progress Notes (Signed)
EPIC Encounter for ICM Monitoring  Patient Name: Alexander Curtis is a 77 y.o. male Date: 09/11/2022 Primary Care Physican: Rita Ohara, MD Primary Cardiologist: Harrington Challenger Electrophysiologist: Vergie Living Pacing:  >99%      06/23/2022 Office weight: 212 lbs    Spoke with patient and heart failure questions reviewed.  Transmission results reviewed.  Pt asymptomatic for fluid accumulation.  Reports feeling well at this time and voices no complaints.     Corvue thoracic impedance suggesting normal fluid levels.     Prescribed: No diuretic   Recommendations:  No changes and encouraged to call if experiencing any fluid symptoms.    Follow-up plan: ICM clinic phone appointment on 10/12/2022.   91 day device clinic remote transmission 10/16/2022.   EP/Cardiology Office Visits:   Recall 05/09/2023 with Dr Caryl Comes.     Copy of ICM check sent to Dr. Caryl Comes.   3 month ICM trend: 09/07/2022.    12-14 Month ICM trend:     [name], RN 09/11/2022 2:47 PM

## 2022-10-12 ENCOUNTER — Ambulatory Visit: Payer: No Typology Code available for payment source | Attending: Internal Medicine

## 2022-10-12 DIAGNOSIS — I5022 Chronic systolic (congestive) heart failure: Secondary | ICD-10-CM | POA: Diagnosis not present

## 2022-10-12 DIAGNOSIS — Z950 Presence of cardiac pacemaker: Secondary | ICD-10-CM | POA: Diagnosis not present

## 2022-10-16 ENCOUNTER — Ambulatory Visit (INDEPENDENT_AMBULATORY_CARE_PROVIDER_SITE_OTHER): Payer: No Typology Code available for payment source

## 2022-10-16 DIAGNOSIS — I428 Other cardiomyopathies: Secondary | ICD-10-CM

## 2022-10-16 NOTE — Progress Notes (Signed)
EPIC Encounter for ICM Monitoring  Patient Name: Alexander Curtis is a 77 y.o. male Date: 10/16/2022 Primary Care Physican: Joselyn Arrow, MD Primary Cardiologist: Tenny Craw Electrophysiologist: Joycelyn Schmid Pacing:  >99%      06/23/2022 Office weight: 212 lbs 10/16/2022 Weight: 209 lbs    Spoke with patient and heart failure questions reviewed.  Transmission results reviewed.  Pt asymptomatic for fluid accumulation.  Reports feeling well at this time and voices no complaints.     Corvue thoracic impedance suggesting normal fluid levels.     Prescribed: No diuretic   Recommendations:  No changes and encouraged to call if experiencing any fluid symptoms.    Follow-up plan: ICM clinic phone appointment on 11/16/2022.   91 day device clinic remote transmission 01/15/2023.   EP/Cardiology Office Visits:   Recall 05/09/2023 with Dr Graciela Husbands.     Copy of ICM check sent to Dr. Graciela Husbands.   3 month ICM trend: 10/12/2022.    12-14 Month ICM trend:     Karie Soda, RN 10/16/2022 2:15 PM

## 2022-10-20 LAB — CUP PACEART REMOTE DEVICE CHECK
Battery Remaining Longevity: 64 mo
Battery Remaining Percentage: 94 %
Battery Voltage: 2.98 V
Brady Statistic AP VP Percent: 23 %
Brady Statistic AP VS Percent: 1 %
Brady Statistic AS VP Percent: 76 %
Brady Statistic AS VS Percent: 1 %
Brady Statistic RA Percent Paced: 23 %
Date Time Interrogation Session: 20240408040011
Implantable Lead Connection Status: 753985
Implantable Lead Connection Status: 753985
Implantable Lead Connection Status: 753985
Implantable Lead Implant Date: 20151220
Implantable Lead Implant Date: 20151220
Implantable Lead Implant Date: 20160819
Implantable Lead Location: 753858
Implantable Lead Location: 753859
Implantable Lead Location: 753860
Implantable Lead Model: 5076
Implantable Lead Model: 5076
Implantable Pulse Generator Implant Date: 20231006
Lead Channel Impedance Value: 400 Ohm
Lead Channel Impedance Value: 430 Ohm
Lead Channel Impedance Value: 510 Ohm
Lead Channel Pacing Threshold Amplitude: 0.5 V
Lead Channel Pacing Threshold Amplitude: 0.75 V
Lead Channel Pacing Threshold Amplitude: 1.5 V
Lead Channel Pacing Threshold Pulse Width: 0.5 ms
Lead Channel Pacing Threshold Pulse Width: 0.5 ms
Lead Channel Pacing Threshold Pulse Width: 1.2 ms
Lead Channel Sensing Intrinsic Amplitude: 12 mV
Lead Channel Sensing Intrinsic Amplitude: 3.8 mV
Lead Channel Setting Pacing Amplitude: 2.5 V
Lead Channel Setting Pacing Amplitude: 2.5 V
Lead Channel Setting Pacing Amplitude: 2.5 V
Lead Channel Setting Pacing Pulse Width: 0.5 ms
Lead Channel Setting Pacing Pulse Width: 1.2 ms
Lead Channel Setting Sensing Sensitivity: 2 mV
Pulse Gen Model: 3562
Pulse Gen Serial Number: 8109412

## 2022-11-16 ENCOUNTER — Ambulatory Visit: Payer: No Typology Code available for payment source | Attending: Internal Medicine

## 2022-11-16 DIAGNOSIS — Z950 Presence of cardiac pacemaker: Secondary | ICD-10-CM

## 2022-11-16 DIAGNOSIS — I5022 Chronic systolic (congestive) heart failure: Secondary | ICD-10-CM | POA: Diagnosis not present

## 2022-11-17 NOTE — Progress Notes (Signed)
EPIC Encounter for ICM Monitoring  Patient Name: MARVEL FINNEGAN is a 77 y.o. male Date: 11/17/2022 Primary Care Physican: Joselyn Arrow, MD Primary Cardiologist: Tenny Craw Electrophysiologist: Joycelyn Schmid Pacing:  >99%      06/23/2022 Office weight: 212 lbs 10/16/2022 Weight: 209 lbs    Spoke with patient and heart failure questions reviewed.  Transmission results reviewed.  Pt asymptomatic for fluid accumulation.  Reports feeling well at this time and voices no complaints.     Corvue thoracic impedance suggesting normal fluid levels.     Prescribed: No diuretic   Recommendations:  No changes and encouraged to call if experiencing any fluid symptoms.    Follow-up plan: ICM clinic phone appointment on 12/21/2022.   91 day device clinic remote transmission 01/15/2023.   EP/Cardiology Office Visits:   Recall 05/09/2023 with Dr Graciela Husbands.  Recall 03/18/2022 with Dr Tenny Craw.    Copy of ICM check sent to Dr. Graciela Husbands.   3 month ICM trend: 11/16/2022.    12-14 Month ICM trend:     Karie Soda, RN 11/17/2022 4:48 PM

## 2022-11-18 NOTE — Progress Notes (Signed)
Remote pacemaker transmission.   

## 2022-12-21 ENCOUNTER — Ambulatory Visit: Payer: No Typology Code available for payment source | Attending: Internal Medicine

## 2022-12-21 DIAGNOSIS — I5022 Chronic systolic (congestive) heart failure: Secondary | ICD-10-CM

## 2022-12-21 DIAGNOSIS — Z950 Presence of cardiac pacemaker: Secondary | ICD-10-CM

## 2022-12-23 NOTE — Progress Notes (Signed)
EPIC Encounter for ICM Monitoring  Patient Name: Alexander Curtis is a 77 y.o. male Date: 12/23/2022 Primary Care Physican: Joselyn Arrow, MD Primary Cardiologist: Tenny Craw Electrophysiologist: Joycelyn Schmid Pacing:  >99%      06/23/2022 Office weight: 212 lbs 10/16/2022 Weight: 209 lbs    Spoke with patient and heart failure questions reviewed.  Transmission results reviewed.  Pt asymptomatic for fluid accumulation.  Reports feeling well at this time and voices no complaints.      Corvue thoracic impedance suggesting normal fluid levels.     Prescribed: No diuretic   Recommendations:  No changes and encouraged to call if experiencing any fluid symptoms.   Follow-up plan: ICM clinic phone appointment on 01/25/2023.   91 day device clinic remote transmission 01/15/2023.   EP/Cardiology Office Visits:   Recall 05/09/2023 with Dr Graciela Husbands.  Recall 03/18/2022 with Dr Tenny Craw (9 mo f/u).    Copy of ICM check sent to Dr. Graciela Husbands.   3 month ICM trend: 12/21/2022.    12-14 Month ICM trend:     Karie Soda, RN 12/23/2022 9:08 AM

## 2023-01-15 ENCOUNTER — Ambulatory Visit (INDEPENDENT_AMBULATORY_CARE_PROVIDER_SITE_OTHER): Payer: No Typology Code available for payment source

## 2023-01-15 DIAGNOSIS — I428 Other cardiomyopathies: Secondary | ICD-10-CM

## 2023-01-15 LAB — CUP PACEART REMOTE DEVICE CHECK
Battery Remaining Longevity: 61 mo
Battery Remaining Percentage: 90 %
Battery Voltage: 2.98 V
Brady Statistic AP VP Percent: 24 %
Brady Statistic AP VS Percent: 1 %
Brady Statistic AS VP Percent: 75 %
Brady Statistic AS VS Percent: 1 %
Brady Statistic RA Percent Paced: 24 %
Date Time Interrogation Session: 20240705095914
Implantable Lead Connection Status: 753985
Implantable Lead Connection Status: 753985
Implantable Lead Connection Status: 753985
Implantable Lead Implant Date: 20151220
Implantable Lead Implant Date: 20151220
Implantable Lead Implant Date: 20160819
Implantable Lead Location: 753858
Implantable Lead Location: 753859
Implantable Lead Location: 753860
Implantable Lead Model: 5076
Implantable Lead Model: 5076
Implantable Pulse Generator Implant Date: 20231006
Lead Channel Impedance Value: 400 Ohm
Lead Channel Impedance Value: 400 Ohm
Lead Channel Impedance Value: 530 Ohm
Lead Channel Pacing Threshold Amplitude: 0.5 V
Lead Channel Pacing Threshold Amplitude: 0.75 V
Lead Channel Pacing Threshold Amplitude: 1.5 V
Lead Channel Pacing Threshold Pulse Width: 0.5 ms
Lead Channel Pacing Threshold Pulse Width: 0.5 ms
Lead Channel Pacing Threshold Pulse Width: 1.2 ms
Lead Channel Sensing Intrinsic Amplitude: 12 mV
Lead Channel Sensing Intrinsic Amplitude: 2.5 mV
Lead Channel Setting Pacing Amplitude: 2.5 V
Lead Channel Setting Pacing Amplitude: 2.5 V
Lead Channel Setting Pacing Amplitude: 2.5 V
Lead Channel Setting Pacing Pulse Width: 0.5 ms
Lead Channel Setting Pacing Pulse Width: 1.2 ms
Lead Channel Setting Sensing Sensitivity: 2 mV
Pulse Gen Model: 3562
Pulse Gen Serial Number: 8109412

## 2023-01-25 ENCOUNTER — Ambulatory Visit: Payer: No Typology Code available for payment source | Attending: Internal Medicine

## 2023-01-25 DIAGNOSIS — I5022 Chronic systolic (congestive) heart failure: Secondary | ICD-10-CM

## 2023-01-25 DIAGNOSIS — Z950 Presence of cardiac pacemaker: Secondary | ICD-10-CM

## 2023-01-29 NOTE — Progress Notes (Signed)
Remote pacemaker transmission.   

## 2023-01-31 NOTE — Progress Notes (Signed)
EPIC Encounter for ICM Monitoring  Patient Name: Alexander Curtis is a 77 y.o. male Date: 01/31/2023 Primary Care Physican: Joselyn Arrow, MD Primary Cardiologist: Tenny Craw Electrophysiologist: Joycelyn Schmid Pacing:  >99%      06/23/2022 Office weight: 212 lbs 10/16/2022 Weight: 209 lbs    Transmission results reviewed.     Corvue thoracic impedance suggesting normal fluid levels.     Prescribed: No diuretic   Recommendations:  No changes.   Follow-up plan: ICM clinic phone appointment on 03/01/2023.   91 day device clinic remote transmission 04/16/2023.   EP/Cardiology Office Visits:  05/13/2023 with Dr Graciela Husbands. 10/31 with Dr Tenny Craw.    Copy of ICM check sent to Dr. Graciela Husbands.   3 month ICM trend: 01/25/2023.    12-14 Month ICM trend:     Karie Soda, RN 01/31/2023 9:29 AM

## 2023-03-01 ENCOUNTER — Ambulatory Visit: Payer: No Typology Code available for payment source | Attending: Internal Medicine

## 2023-03-01 DIAGNOSIS — Z950 Presence of cardiac pacemaker: Secondary | ICD-10-CM | POA: Diagnosis not present

## 2023-03-01 DIAGNOSIS — I5022 Chronic systolic (congestive) heart failure: Secondary | ICD-10-CM

## 2023-03-04 NOTE — Progress Notes (Signed)
EPIC Encounter for ICM Monitoring  Patient Name: Alexander Curtis is a 77 y.o. male Date: 03/04/2023 Primary Care Physican: Joselyn Arrow, MD Primary Cardiologist: Tenny Craw Electrophysiologist: Joycelyn Schmid Pacing:  >99%      06/23/2022 Office weight: 212 lbs 10/16/2022 Weight: 209 lbs    Spoke with wife and heart failure questions reviewed.  Transmission results reviewed.  Pt asymptomatic for fluid accumulation.  Reports feeling well at this time and voices no complaints.     Corvue thoracic impedance suggesting normal fluid levels.     Prescribed: No diuretic   Recommendations:  Recommendation to limit salt intake to 2000 mg daily and fluid intake to 64 oz daily.  Encouraged to call if experiencing any fluid symptoms.    Follow-up plan: ICM clinic phone appointment on 04/05/2023.   91 day device clinic remote transmission 04/16/2023.   EP/Cardiology Office Visits:  05/13/2023 with Dr Graciela Husbands. 10/31 with Dr Tenny Craw.    Copy of ICM check sent to Dr. Graciela Husbands.    3 month ICM trend: 03/01/2023.    12-14 Month ICM trend:     Karie Soda, RN 03/04/2023 3:36 PM

## 2023-03-09 DIAGNOSIS — E663 Overweight: Secondary | ICD-10-CM | POA: Diagnosis not present

## 2023-03-09 DIAGNOSIS — L309 Dermatitis, unspecified: Secondary | ICD-10-CM | POA: Diagnosis not present

## 2023-03-09 DIAGNOSIS — Z6827 Body mass index (BMI) 27.0-27.9, adult: Secondary | ICD-10-CM | POA: Diagnosis not present

## 2023-03-09 DIAGNOSIS — L03115 Cellulitis of right lower limb: Secondary | ICD-10-CM | POA: Diagnosis not present

## 2023-04-05 ENCOUNTER — Ambulatory Visit: Payer: No Typology Code available for payment source | Attending: Internal Medicine

## 2023-04-05 DIAGNOSIS — Z950 Presence of cardiac pacemaker: Secondary | ICD-10-CM

## 2023-04-05 DIAGNOSIS — I5022 Chronic systolic (congestive) heart failure: Secondary | ICD-10-CM | POA: Diagnosis not present

## 2023-04-08 NOTE — Progress Notes (Signed)
EPIC Encounter for ICM Monitoring  Patient Name: Alexander Curtis is a 77 y.o. male Date: 04/08/2023 Primary Care Physican: Joselyn Arrow, MD Primary Cardiologist: Tenny Craw Electrophysiologist: Joycelyn Schmid Pacing:  >99%      06/23/2022 Office weight: 212 lbs 10/16/2022 Weight: 209 lbs 04/08/2023 Weight: 206-209 lbs    Spoke with patient and heart failure questions reviewed.  Transmission results reviewed.  Pt asymptomatic for fluid accumulation.  Reports feeling well at this time and voices no complaints.     Corvue thoracic impedance suggesting normal fluid levels.     Prescribed: No diuretic   Recommendations:   Encouraged to call if experiencing any fluid symptoms.    Follow-up plan: ICM clinic phone appointment on 05/17/2023.   91 day device clinic remote transmission 04/16/2023.   EP/Cardiology Office Visits:  05/13/2023 with Dr Graciela Husbands. 05/13/2023 with Dr Tenny Craw.    Copy of ICM check sent to Dr. Graciela Husbands.    3 month ICM trend: 04/05/2023.    12-14 Month ICM trend:     Karie Soda, RN 04/08/2023 12:59 PM

## 2023-04-16 ENCOUNTER — Ambulatory Visit (INDEPENDENT_AMBULATORY_CARE_PROVIDER_SITE_OTHER): Payer: No Typology Code available for payment source

## 2023-04-16 DIAGNOSIS — I428 Other cardiomyopathies: Secondary | ICD-10-CM | POA: Diagnosis not present

## 2023-04-17 LAB — CUP PACEART REMOTE DEVICE CHECK
Battery Remaining Longevity: 59 mo
Battery Remaining Percentage: 85 %
Battery Voltage: 2.98 V
Brady Statistic AP VP Percent: 26 %
Brady Statistic AP VS Percent: 1 %
Brady Statistic AS VP Percent: 73 %
Brady Statistic AS VS Percent: 1 %
Brady Statistic RA Percent Paced: 25 %
Date Time Interrogation Session: 20241004023746
Implantable Lead Connection Status: 753985
Implantable Lead Connection Status: 753985
Implantable Lead Connection Status: 753985
Implantable Lead Implant Date: 20151220
Implantable Lead Implant Date: 20151220
Implantable Lead Implant Date: 20160819
Implantable Lead Location: 753858
Implantable Lead Location: 753859
Implantable Lead Location: 753860
Implantable Lead Model: 5076
Implantable Lead Model: 5076
Implantable Pulse Generator Implant Date: 20231006
Lead Channel Impedance Value: 390 Ohm
Lead Channel Impedance Value: 400 Ohm
Lead Channel Impedance Value: 630 Ohm
Lead Channel Pacing Threshold Amplitude: 0.5 V
Lead Channel Pacing Threshold Amplitude: 0.75 V
Lead Channel Pacing Threshold Amplitude: 1.5 V
Lead Channel Pacing Threshold Pulse Width: 0.5 ms
Lead Channel Pacing Threshold Pulse Width: 0.5 ms
Lead Channel Pacing Threshold Pulse Width: 1.2 ms
Lead Channel Sensing Intrinsic Amplitude: 11.2 mV
Lead Channel Sensing Intrinsic Amplitude: 2.1 mV
Lead Channel Setting Pacing Amplitude: 2.5 V
Lead Channel Setting Pacing Amplitude: 2.5 V
Lead Channel Setting Pacing Amplitude: 2.5 V
Lead Channel Setting Pacing Pulse Width: 0.5 ms
Lead Channel Setting Pacing Pulse Width: 1.2 ms
Lead Channel Setting Sensing Sensitivity: 2 mV
Pulse Gen Model: 3562
Pulse Gen Serial Number: 8109412

## 2023-04-28 NOTE — Progress Notes (Signed)
Remote pacemaker transmission.   

## 2023-05-11 DIAGNOSIS — I459 Conduction disorder, unspecified: Secondary | ICD-10-CM | POA: Insufficient documentation

## 2023-05-13 ENCOUNTER — Ambulatory Visit: Payer: No Typology Code available for payment source | Attending: Internal Medicine | Admitting: Internal Medicine

## 2023-05-13 ENCOUNTER — Encounter: Payer: Self-pay | Admitting: Internal Medicine

## 2023-05-13 ENCOUNTER — Ambulatory Visit (INDEPENDENT_AMBULATORY_CARE_PROVIDER_SITE_OTHER): Payer: No Typology Code available for payment source | Admitting: Internal Medicine

## 2023-05-13 VITALS — BP 152/85 | HR 66 | Ht 73.0 in | Wt 212.0 lb

## 2023-05-13 VITALS — BP 126/72 | HR 72 | Ht 73.0 in | Wt 216.4 lb

## 2023-05-13 DIAGNOSIS — I459 Conduction disorder, unspecified: Secondary | ICD-10-CM

## 2023-05-13 DIAGNOSIS — Z950 Presence of cardiac pacemaker: Secondary | ICD-10-CM | POA: Diagnosis not present

## 2023-05-13 DIAGNOSIS — I428 Other cardiomyopathies: Secondary | ICD-10-CM | POA: Diagnosis not present

## 2023-05-13 DIAGNOSIS — R55 Syncope and collapse: Secondary | ICD-10-CM

## 2023-05-13 DIAGNOSIS — I10 Essential (primary) hypertension: Secondary | ICD-10-CM

## 2023-05-13 LAB — CUP PACEART INCLINIC DEVICE CHECK
Battery Remaining Longevity: 57 mo
Battery Voltage: 2.98 V
Brady Statistic RA Percent Paced: 26 %
Brady Statistic RV Percent Paced: 99.22 %
Date Time Interrogation Session: 20241031154324
Implantable Lead Connection Status: 753985
Implantable Lead Connection Status: 753985
Implantable Lead Connection Status: 753985
Implantable Lead Implant Date: 20151220
Implantable Lead Implant Date: 20151220
Implantable Lead Implant Date: 20160819
Implantable Lead Location: 753858
Implantable Lead Location: 753859
Implantable Lead Location: 753860
Implantable Lead Model: 5076
Implantable Lead Model: 5076
Implantable Pulse Generator Implant Date: 20231006
Lead Channel Impedance Value: 400 Ohm
Lead Channel Impedance Value: 425 Ohm
Lead Channel Impedance Value: 625 Ohm
Lead Channel Pacing Threshold Amplitude: 0.75 V
Lead Channel Pacing Threshold Amplitude: 0.75 V
Lead Channel Pacing Threshold Amplitude: 0.75 V
Lead Channel Pacing Threshold Amplitude: 0.75 V
Lead Channel Pacing Threshold Amplitude: 1.25 V
Lead Channel Pacing Threshold Amplitude: 1.25 V
Lead Channel Pacing Threshold Pulse Width: 0.5 ms
Lead Channel Pacing Threshold Pulse Width: 0.5 ms
Lead Channel Pacing Threshold Pulse Width: 0.5 ms
Lead Channel Pacing Threshold Pulse Width: 0.5 ms
Lead Channel Pacing Threshold Pulse Width: 1.2 ms
Lead Channel Pacing Threshold Pulse Width: 1.2 ms
Lead Channel Sensing Intrinsic Amplitude: 0 mV
Lead Channel Sensing Intrinsic Amplitude: 10 mV
Lead Channel Sensing Intrinsic Amplitude: 2.9 mV
Lead Channel Setting Pacing Amplitude: 2.5 V
Lead Channel Setting Pacing Amplitude: 2.5 V
Lead Channel Setting Pacing Amplitude: 2.5 V
Lead Channel Setting Pacing Pulse Width: 0.5 ms
Lead Channel Setting Pacing Pulse Width: 1.2 ms
Lead Channel Setting Sensing Sensitivity: 2 mV
Pulse Gen Model: 3562
Pulse Gen Serial Number: 8109412

## 2023-05-13 NOTE — Patient Instructions (Signed)
Medication Instructions:   *If you need a refill on your cardiac medications before your next appointment, please call your pharmacy*   Lab Work:  If you have labs (blood work) drawn today and your tests are completely normal, you will receive your results only by: MyChart Message (if you have MyChart) OR A paper copy in the mail If you have any lab test that is abnormal or we need to change your treatment, we will call you to review the results.   Testing/Procedures:    Follow-Up: At [facility], you and your health needs are our priority.  As part of our continuing mission to provide you with exceptional heart care, we have created designated Provider Care Teams.  These Care Teams include your primary Cardiologist (physician) and Advanced Practice Providers (APPs -  Physician Assistants and Nurse Practitioners) who all work together to provide you with the care you need, when you need it.  We recommend signing up for the patient portal called "MyChart".  Sign up information is provided on this After Visit Summary.  MyChart is used to connect with patients for Virtual Visits (Telemedicine).  Patients are able to view lab/test results, encounter notes, upcoming appointments, etc.  Non-urgent messages can be sent to your provider as well.   To learn more about what you can do with MyChart, go to [email or web address].    Your next appointment:  June 2025

## 2023-05-13 NOTE — Progress Notes (Signed)
Patient Care Team: Joselyn Arrow, MD as PCP - General (Family Medicine) Pricilla Riffle, MD as PCP - Cardiology (Cardiology) Alexander Salvia, MD as PCP - Electrophysiology (Cardiology) Clinic, [facility]   HPI  Alexander Curtis is a 77 y.o. male Seen today, a prior patient of Dr. Fawn Curtis, who underwent pacing 2015 Abbott for syncope intermittent left bundle branch block and then symptomatic 2: 1 AV block.  Pacing was associated with rapid deterioration in LV function as noted below, and he then underwent upgrade 2016 with interval recovery.  Change 10/23, without concerns   When we saw him initially, it was noted that his pacing spike was in the middle of the QRS, we shorten the AV delay to force ventricular pacing with the ability to configure a resynchronize ECG  The patient denies chest pain, shortness of breath, nocturnal dyspnea, orthopnea or peripheral edema.  There have been no palpitations, lightheadedness or syncope.    DATE TEST EF   12/15 Echo  55-60 %   4/16 Echo   35-40 %   2/17 Echo   45-50 %   1/23 Echo  60-65% LAE            Records and Results Reviewed    Past Medical History:  Diagnosis Date   Arthritis of shoulder    right (CT done 11/2018)   Atrial enlargement, left    Bradycardia 06/30/2014   Diabetes mellitus, type 2 (HCC)    Diabetic peripheral neuropathy (HCC) 01/24/2017   [facility] and [facility] g[facility]   HLD (hyperlipidemia)    Hypertension 05/2013   LBBB (left bundle branch block)    Melanoma (HCC) 1968   R arm   Mitral regurgitation    Mobitz type 2 second degree heart block    a. s/p Medtronic Adapta L model ADDRL 1 (serial number [id number]) pacemaker. 07/02/14 b.  upgrade to STJ CRTP 02/2015   Non-ischemic cardiomyopathy (HCC)    a. felt to be 2/2 RV pacing   Obstructive sleep apnea    compliant with CPAP   Pacemaker    a. Medtronic Adapta L model ADDRL 1 (serial number [id number]) pacemaker.    Pulmonary nodules     noted on CT of shoulder 03/2016, up to 5mm in size on CT chest 04/2016. repeat 1 year due to h/o melanoma   Syncope 2011   a. 2011 -negative workup except LBBB. thought to be vasovagal   Tricuspid regurgitation     Past Surgical History:  Procedure Laterality Date   EP IMPLANTABLE DEVICE N/A 03/01/2015   STJ CRTP upgrade by Dr Alexander Curtis   FINGER TENDON REPAIR     R 2nd and 3rd finger   INGUINAL HERNIA REPAIR     bilateral   KNEE ARTHROSCOPY  02/2011   L knee for meniscal tear. Dr. Magnus Ivan   PERMANENT PACEMAKER INSERTION N/A 07/01/2014   MDT ADDRL1 pacemaker implanted for Mobitz II Dr Alexander Curtis   [facility] GENERATOR CHANGEOUT N/A 04/17/2022   Procedure: PPM GENERATOR CHANGEOUT;  Surgeon: Alexander Salvia, MD;  Location: [facility] INVASIVE CV LAB;  Service: Cardiovascular;  Laterality: N/A;   UVULECTOMY     laser treatment for sleep apnea (NOT UP3)   VASECTOMY      Current Meds  Medication Sig   atorvastatin (LIPITOR) 40 MG tablet Take 40 mg by mouth at bedtime.   carvedilol (COREG) 6.25 MG tablet TAKE 1 TABLET(6.25 MG) BY MOUTH TWICE  DAILY WITH A MEAL   CINNAMON PO Take 1,000 mg by mouth 2 (two) times daily.   Coenzyme Q10 (COQ10) 200 MG CAPS Take 200 mg by mouth 2 (two) times daily.   fish oil-omega-3 fatty acids 1000 MG capsule Take 1 g by mouth 3 (three) times daily.    fluticasone (FLONASE) 50 MCG/ACT nasal spray Place 1 spray into both nostrils daily as needed for allergies or rhinitis.   gabapentin (NEURONTIN) 100 MG capsule Take 100 mg by mouth 2 (two) times daily.   lisinopril (ZESTRIL) 10 MG tablet Take 10 mg by mouth daily.   metFORMIN (GLUCOPHAGE) 500 MG tablet Take 500 mg by mouth 2 (two) times daily with a meal.   Multiple Vitamins-Minerals (MENS 50+ MULTI VITAMIN/MIN PO) Take 1 tablet by mouth daily.   Turmeric 500 MG CAPS Take 500 mg by mouth daily.    Allergies  Allergen Reactions   Horse-Derived Products Anaphylaxis    REACTION: Anaphlactic Reaction.  Can't take tetanus shot    Pravastatin Other (See Comments)    Joint pain    Adhesive [Tape] Itching and Rash      Review of Systems negative except from HPI and PMH  Physical Exam BP (!) 152/85 (Patient Position: Standing) Comment (Patient Position): after 3 minutes  Pulse 66   Ht 6\' 1"  (1.854 m)   Wt 212 lb (96.2 kg)   SpO2 97%   BMI 27.97 kg/m  Well developed and well nourished in no acute distress HENT normal Neck supple with JVP-flat Clear Device pocket well healed; without hematoma or erythema.  There is no tethering  Regular rate and rhythm, no murmur Abd-soft with active BS No Clubbing cyanosis  edema Skin-warm and dry A & Oriented  Grossly normal sensory and motor function  ECG sinus with competitive atrial pacing with ventricular pacing with an upright QRS lead I and rs lead V1 and a QRS duration of 140 ms  Device function is normal. Programming changes none  See Paceart for details    Reprogramming of the device to shorten the AV delay and forced ventricular pacing as opposed to search resulted in P synchronous pacing at 64 Interval 14/13/45 sQR in lead I and rS in lead V1  CrCl cannot be calculated (Patient's most recent lab result is older than the maximum 21 days allowed.).   Assessment and  Plan  High-grade intermittent heart block  Pacemaker induced cardiomyopathy  Status post pacemaker with CRT upgrade  Right ventricular amplitude low with right ventricular under sensing and inappropriate ventricular pacing  Syncope-recurrent-vasovagal  Hypertension  Device function is normal.  Resolution of pacemaker associated cardiomyopathy continue carvedilol and lisinopril.   No interval syncope  Blood pressure this morning was 120.  Not withstanding the fact that is 150 will make no changes

## 2023-05-13 NOTE — Progress Notes (Signed)
Cardiology Office Note   Date:  05/13/2023   ID:  Alexander, Curtis 1946-02-11, MRN [id number]  PCP:  Joselyn Arrow, MD  Cardiologist:   Dietrich Pates, MD   Pt presents for follow up of NICM      History of Present Illness: Alexander Curtis is a 77 y.o. male with a history of high degree AV block   S/P PPM 2011  Echo I n 2016 LVEF 35 to 40%   Upgraded to BiV pacer. I last saw the pt in 2022  He has been seen by Leda Gauze and Odessa Fleming since   The pt is doing well   He moved to [facility] this past year    Continues to follow with Dr Lynelle Doctor and the [place]  He denies CP   Breathing is OK  No edema   No palpitatoins  Diet: Br:  Plain yogurt, protein drink, fruit Lunch  Protein bar, ritz crackers x 5, water Dinner   Meat, potato, vegges    Water Occasional cheeto   Current Meds  Medication Sig   atorvastatin (LIPITOR) 40 MG tablet Take 40 mg by mouth at bedtime.   carvedilol (COREG) 6.25 MG tablet TAKE 1 TABLET(6.25 MG) BY MOUTH TWICE DAILY WITH A MEAL   CINNAMON PO Take 1,000 mg by mouth 2 (two) times daily.   Coenzyme Q10 (COQ10) 200 MG CAPS Take 200 mg by mouth 2 (two) times daily.   fish oil-omega-3 fatty acids 1000 MG capsule Take 1 g by mouth 3 (three) times daily.    fluticasone (FLONASE) 50 MCG/ACT nasal spray Place 1 spray into both nostrils daily as needed for allergies or rhinitis.   gabapentin (NEURONTIN) 100 MG capsule Take 100 mg by mouth 2 (two) times daily.   lisinopril (ZESTRIL) 10 MG tablet Take 10 mg by mouth daily.   metFORMIN (GLUCOPHAGE) 500 MG tablet Take 500 mg by mouth 2 (two) times daily with a meal.   Multiple Vitamins-Minerals (MENS 50+ MULTI VITAMIN/MIN PO) Take 1 tablet by mouth daily.   Turmeric 500 MG CAPS Take 500 mg by mouth daily.     Allergies:   Horse-derived products, Pravastatin, and Adhesive [tape]   Past Medical History:  Diagnosis Date   Arthritis of shoulder    right (CT done 11/2018)   Atrial enlargement, left    Bradycardia 06/30/2014    Diabetes mellitus, type 2 (HCC)    Diabetic peripheral neuropathy (HCC) 01/24/2017   [facility]   HLD (hyperlipidemia)    Hypertension 05/2013   LBBB (left bundle branch block)    Melanoma (HCC) 1968   R arm   Mitral regurgitation    Mobitz type 2 second degree heart block    a. s/p Medtronic Adapta L model ADDRL 1 (serial number [id number]) pacemaker. 07/02/14 b.  upgrade to STJ CRTP 02/2015   Non-ischemic cardiomyopathy (HCC)    a. felt to be 2/2 RV pacing   Obstructive sleep apnea    compliant with CPAP   Pacemaker    a. Medtronic Adapta L model ADDRL 1 (serial number [id number]) pacemaker.    Pulmonary nodules    noted on CT of shoulder 03/2016, up to 5mm in size on CT chest 04/2016. repeat 1 year due to h/o melanoma   Syncope 2011   a. 2011 -negative workup except LBBB. thought to be vasovagal   Tricuspid regurgitation     Past Surgical History:  Procedure Laterality Date   EP IMPLANTABLE DEVICE N/A 03/01/2015   STJ CRTP upgrade by Dr [name]   FINGER TENDON REPAIR     R 2nd and 3rd finger   INGUINAL HERNIA REPAIR     bilateral   KNEE ARTHROSCOPY  02/2011   L knee for meniscal tear. Dr. Magnus Ivan   PERMANENT PACEMAKER INSERTION N/A 07/01/2014   MDT ADDRL1 pacemaker implanted for Mobitz II Dr [name]   [facility] GENERATOR CHANGEOUT N/A 04/17/2022   Procedure: PPM GENERATOR CHANGEOUT;  Surgeon: [name], MD;  Location: [facility] INVASIVE CV LAB;  Service: Cardiovascular;  Laterality: N/A;   UVULECTOMY     laser treatment for sleep apnea (NOT UP3)   VASECTOMY       Social History:  The patient  reports that he has never smoked. He has never used smokeless tobacco. He reports current alcohol use. He reports that he does not use drugs.   Family History:  The patient's family history includes Cancer in his mother; Dementia in his mother and paternal aunt; Diabetes in his maternal grandmother and sister; Heart attack in his maternal grandfather; Heart  disease in his maternal grandfather; Melanoma (age of onset: 93) in his mother; Parkinson's disease in his maternal aunt; Stroke (age of onset: 2) in his father; Stroke (age of onset: 42) in his sister.    ROS:  Please see the history of present illness. All other systems are reviewed and  Negative to the above problem except as noted.    PHYSICAL EXAM: VS:  BP 126/72   Pulse 72   Ht 6\' 1"  (1.854 m)   Wt 216 lb 6.4 oz (98.2 kg)   SpO2 95%   BMI 28.55 kg/m   GEN: Well nourished, well developed, in no acute distress  HEENT: normal  Neck: JVP normal  No carotid bruit  Cardiac: RRR; no murmur  No LE edema  Respiratory:  clear to auscultation GI: soft, nontender, No hepatomegaly  MS: no deformity Moving all extremities   Skin: warm and dry, no rash  EKG:  EKG is not ordered today.      Lipid Panel    Component Value Date/Time   CHOL 127 09/02/2015 0001   TRIG 65 09/02/2015 0001   HDL 40 09/02/2015 0001   CHOLHDL 3.2 09/02/2015 0001   VLDL 13 09/02/2015 0001   LDLCALC 74 09/02/2015 0001      Wt Readings from Last 3 Encounters:  05/13/23 212 lb (96.2 kg)  05/13/23 216 lb 6.4 oz (98.2 kg)  08/12/22 215 lb 3.2 oz (97.6 kg)      ASSESSMENT AND PLAN:  1  Hx HFrEF   Felt pacer induced   Now with BiVpacer   Last LVEF on echo normal Volume status is good    Follow     2  Hx AV block   s/p BiV PPM  Follows with [name]  3   Atrial fib  One documented spell    Continues to have regular  device interrogations   Follow  Not on anticoag  4   HL  Lipids done at [facility]  LDL 58  HDL 43   Trig 65     5  T2DM   Last A1C was 6.6   Recomm [facility]   Needs tighter control   Cut out processed foods  5  Diet   Continue to watch carbs.    Will follow up next summer  Current medicines are reviewed at length with the patient today.  The patient does not have concerns regarding medicines.  Signed, Dietrich Pates, MD  05/13/2023 8:42 PM    [facility]  [facility] [address] [place], [place], [place]  [address] Phone: [phone]; Fax: [phone]

## 2023-05-13 NOTE — Patient Instructions (Signed)

## 2023-05-19 NOTE — Progress Notes (Signed)
No ICM remote transmission received for 05/17/2023 and next ICM transmission scheduled for 05/31/2023.

## 2023-05-31 ENCOUNTER — Ambulatory Visit: Payer: No Typology Code available for payment source | Attending: Internal Medicine

## 2023-05-31 DIAGNOSIS — I5022 Chronic systolic (congestive) heart failure: Secondary | ICD-10-CM | POA: Diagnosis not present

## 2023-05-31 DIAGNOSIS — Z950 Presence of cardiac pacemaker: Secondary | ICD-10-CM

## 2023-06-01 NOTE — Progress Notes (Signed)
EPIC Encounter for ICM Monitoring  Patient Name: Alexander Curtis is a 77 y.o. male Date: 06/01/2023 Primary Care Physican: Joselyn Arrow, MD Primary Cardiologist: Tenny Craw Electrophysiologist: Joycelyn Schmid Pacing:  99%      06/23/2022 Office weight: 212 lbs 10/16/2022 Weight: 209 lbs 04/08/2023 Weight: 206-209 lbs 06/01/2023 Weight: 207 lbs    Spoke with patient and heart failure questions reviewed.  Transmission results reviewed.  Pt asymptomatic for fluid accumulation.  Reports feeling well at this time and voices no complaints.     Corvue thoracic impedance suggesting normal fluid levels.     Prescribed: No diuretic   Recommendations:   No changes and encouraged to call if experiencing any fluid symptoms.   Follow-up plan: ICM clinic phone appointment on 07/05/2023.   91 day device clinic remote transmission 07/16/2023.   EP/Cardiology Office Visits:  Recall 05/07/2024 with Dr Graciela Husbands.  Recall 12/19/2023 with Dr Tenny Craw.    Copy of ICM check sent to Dr. Graciela Husbands.    3 month ICM trend: 05/31/2023.    12-14 Month ICM trend:     Karie Soda, RN 06/01/2023 9:30 AM

## 2023-07-08 NOTE — Progress Notes (Signed)
No ICM remote transmission received for 07/05/2023 and next ICM transmission scheduled for 07/26/2023.

## 2023-07-16 ENCOUNTER — Ambulatory Visit: Payer: No Typology Code available for payment source | Attending: Internal Medicine

## 2023-07-16 DIAGNOSIS — I459 Conduction disorder, unspecified: Secondary | ICD-10-CM | POA: Diagnosis not present

## 2023-07-16 LAB — CUP PACEART REMOTE DEVICE CHECK
Battery Remaining Longevity: 56 mo
Battery Remaining Percentage: 81 %
Battery Voltage: 2.98 V
Brady Statistic AP VP Percent: 28 %
Brady Statistic AP VS Percent: 1 %
Brady Statistic AS VP Percent: 71 %
Brady Statistic AS VS Percent: 1 %
Brady Statistic RA Percent Paced: 27 %
Date Time Interrogation Session: 20250103021859
Implantable Lead Connection Status: 753985
Implantable Lead Connection Status: 753985
Implantable Lead Connection Status: 753985
Implantable Lead Implant Date: 20151220
Implantable Lead Implant Date: 20151220
Implantable Lead Implant Date: 20160819
Implantable Lead Location: 753858
Implantable Lead Location: 753859
Implantable Lead Location: 753860
Implantable Lead Model: 5076
Implantable Lead Model: 5076
Implantable Pulse Generator Implant Date: 20231006
Lead Channel Impedance Value: 400 Ohm
Lead Channel Impedance Value: 400 Ohm
Lead Channel Impedance Value: 660 Ohm
Lead Channel Pacing Threshold Amplitude: 0.75 V
Lead Channel Pacing Threshold Amplitude: 0.75 V
Lead Channel Pacing Threshold Amplitude: 1.25 V
Lead Channel Pacing Threshold Pulse Width: 0.5 ms
Lead Channel Pacing Threshold Pulse Width: 0.5 ms
Lead Channel Pacing Threshold Pulse Width: 1.2 ms
Lead Channel Sensing Intrinsic Amplitude: 12 mV
Lead Channel Sensing Intrinsic Amplitude: 3.4 mV
Lead Channel Setting Pacing Amplitude: 2.5 V
Lead Channel Setting Pacing Amplitude: 2.5 V
Lead Channel Setting Pacing Amplitude: 2.5 V
Lead Channel Setting Pacing Pulse Width: 0.5 ms
Lead Channel Setting Pacing Pulse Width: 1.2 ms
Lead Channel Setting Sensing Sensitivity: 2 mV
Pulse Gen Model: 3562
Pulse Gen Serial Number: 8109412

## 2023-07-26 ENCOUNTER — Ambulatory Visit: Payer: No Typology Code available for payment source | Attending: Internal Medicine

## 2023-07-26 DIAGNOSIS — I5022 Chronic systolic (congestive) heart failure: Secondary | ICD-10-CM

## 2023-07-26 DIAGNOSIS — Z950 Presence of cardiac pacemaker: Secondary | ICD-10-CM

## 2023-07-28 ENCOUNTER — Telehealth: Payer: Self-pay

## 2023-07-28 NOTE — Progress Notes (Signed)
 EPIC Encounter for ICM Monitoring  Patient Name: Alexander Curtis is a 78 y.o. male Date: 07/28/2023 Primary Care Physican: Randol Dawes, MD Primary Cardiologist: Okey Electrophysiologist: Fernande Pore Pacing:  99%      06/23/2022 Office weight: 212 lbs 10/16/2022 Weight: 209 lbs 04/08/2023 Weight: 206-209 lbs 06/01/2023 Weight: 207 lbs    Attempted call to patient and unable to reach.  Left detailed message per DPR regarding transmission.  Transmission results reviewed.    Corvue thoracic impedance suggesting intermittent days with possible fluid accumulation within the last month.     Prescribed: No diuretic   Recommendations:  Left voice mail with ICM number and encouraged to call if experiencing any fluid symptoms.   Follow-up plan: ICM clinic phone appointment on 08/30/2023.   91 day device clinic remote transmission 10/15/2023.   EP/Cardiology Office Visits:  Recall 05/07/2024 with Dr Fernande.  Recall 12/19/2023 with Dr Okey.    Copy of ICM check sent to Dr. Fernande.    3 month ICM trend: 07/26/2023.    12-14 Month ICM trend:     Mitzie GORMAN Garner, RN 07/28/2023 8:24 AM

## 2023-07-28 NOTE — Telephone Encounter (Signed)
 Remote ICM transmission received.  Attempted call to patient regarding ICM remote transmission and left detailed message per DPR.  Left ICM phone number and advised to return call for any fluid symptoms or questions. Next ICM remote transmission scheduled 08/30/2023.

## 2023-08-01 ENCOUNTER — Ambulatory Visit: Admitting: Acute Care

## 2023-08-11 ENCOUNTER — Encounter: Payer: Self-pay | Admitting: Internal Medicine

## 2023-08-20 NOTE — Progress Notes (Signed)
 Remote pacemaker transmission.

## 2023-08-30 ENCOUNTER — Ambulatory Visit: Payer: No Typology Code available for payment source | Attending: Internal Medicine

## 2023-08-30 DIAGNOSIS — I5022 Chronic systolic (congestive) heart failure: Secondary | ICD-10-CM

## 2023-08-30 DIAGNOSIS — Z950 Presence of cardiac pacemaker: Secondary | ICD-10-CM | POA: Diagnosis not present

## 2023-08-31 ENCOUNTER — Telehealth: Payer: Self-pay

## 2023-08-31 NOTE — Telephone Encounter (Signed)
 Remote ICM transmission received.  Attempted call to patient regarding ICM remote transmission and left detailed message per DPR.  Left ICM phone number and advised to return call for any fluid symptoms or questions. Next ICM remote transmission scheduled 10/04/2023.

## 2023-08-31 NOTE — Progress Notes (Signed)
 EPIC Encounter for ICM Monitoring  Patient Name: Alexander Curtis is a 78 y.o. male Date: 08/31/2023 Primary Care Physican: Joselyn Arrow, MD Primary Cardiologist: Tenny Craw Electrophysiologist: Joycelyn Schmid Pacing:  98%      06/23/2022 Office weight: 212 lbs 10/16/2022 Weight: 209 lbs 04/08/2023 Weight: 206-209 lbs 06/01/2023 Weight: 207 lbs    Attempted call to patient and unable to reach.  Left detailed message per DPR regarding transmission.  Transmission results reviewed.    Corvue thoracic impedance suggesting intermittent days with possible fluid accumulation within the last month.     Prescribed: No diuretic   Recommendations:  Left voice mail with ICM number and encouraged to call if experiencing any fluid symptoms.   Follow-up plan: ICM clinic phone appointment on 10/04/2023.   91 day device clinic remote transmission 10/15/2023.   EP/Cardiology Office Visits:  Recall 05/07/2024 with Dr Graciela Husbands.  Recall 12/19/2023 with Dr Tenny Craw.    Copy of ICM check sent to Dr. Graciela Husbands.     3 month ICM trend: 08/30/2023.    12-14 Month ICM trend:     Karie Soda, RN 08/31/2023 3:41 PM

## 2023-10-04 ENCOUNTER — Ambulatory Visit: Payer: No Typology Code available for payment source | Attending: Internal Medicine

## 2023-10-04 DIAGNOSIS — Z950 Presence of cardiac pacemaker: Secondary | ICD-10-CM | POA: Diagnosis not present

## 2023-10-04 DIAGNOSIS — I5022 Chronic systolic (congestive) heart failure: Secondary | ICD-10-CM | POA: Diagnosis not present

## 2023-10-08 NOTE — Progress Notes (Signed)
 EPIC Encounter for ICM Monitoring  Patient Name: Alexander Curtis is a 78 y.o. male Date: 10/08/2023 Primary Care Physican: Joselyn Arrow, MD Primary Cardiologist: Tenny Craw Electrophysiologist: Joycelyn Schmid Pacing:  97%      06/23/2022 Office weight: 212 lbs 10/16/2022 Weight: 209 lbs 04/08/2023 Weight: 206-209 lbs 06/01/2023 Weight: 207 lbs 10/08/2023 Weight: 211 lbs  Spoke with patient and heart failure questions reviewed.  Transmission results reviewed.  Pt asymptomatic for fluid accumulation but did have some weight gain during decreased impedance.  Reports feeling well at this time and voices no complaints.     Corvue thoracic impedance suggesting normal fluid levels with the exception of possible fluid accumulation from 3/16-3/18.   Prescribed: No diuretic   Recommendations:  No changes and encouraged to call if experiencing any fluid symptoms.   Follow-up plan: ICM clinic phone appointment on 11/08/2023.   91 day device clinic remote transmission 10/15/2023.   EP/Cardiology Office Visits:  Recall 05/07/2024 with Dr Graciela Husbands.  Recall 12/19/2023 with Dr Tenny Craw.    Copy of ICM check sent to Dr. Graciela Husbands.      3 month ICM trend: 10/04/2023.    12-14 Month ICM trend:     Karie Soda, RN 10/08/2023 1:13 PM

## 2023-10-15 ENCOUNTER — Ambulatory Visit: Payer: No Typology Code available for payment source

## 2023-10-15 DIAGNOSIS — I428 Other cardiomyopathies: Secondary | ICD-10-CM | POA: Diagnosis not present

## 2023-10-16 LAB — CUP PACEART REMOTE DEVICE CHECK
Battery Remaining Longevity: 55 mo
Battery Remaining Percentage: 77 %
Battery Voltage: 2.98 V
Brady Statistic AP VP Percent: 29 %
Brady Statistic AP VS Percent: 1 %
Brady Statistic AS VP Percent: 69 %
Brady Statistic AS VS Percent: 1 %
Brady Statistic RA Percent Paced: 27 %
Date Time Interrogation Session: 20250404020009
Implantable Lead Connection Status: 753985
Implantable Lead Connection Status: 753985
Implantable Lead Connection Status: 753985
Implantable Lead Implant Date: 20151220
Implantable Lead Implant Date: 20151220
Implantable Lead Implant Date: 20160819
Implantable Lead Location: 753858
Implantable Lead Location: 753859
Implantable Lead Location: 753860
Implantable Lead Model: 5076
Implantable Lead Model: 5076
Implantable Pulse Generator Implant Date: 20231006
Lead Channel Impedance Value: 400 Ohm
Lead Channel Impedance Value: 400 Ohm
Lead Channel Impedance Value: 730 Ohm
Lead Channel Pacing Threshold Amplitude: 0.75 V
Lead Channel Pacing Threshold Amplitude: 0.75 V
Lead Channel Pacing Threshold Amplitude: 1.25 V
Lead Channel Pacing Threshold Pulse Width: 0.5 ms
Lead Channel Pacing Threshold Pulse Width: 0.5 ms
Lead Channel Pacing Threshold Pulse Width: 1.2 ms
Lead Channel Sensing Intrinsic Amplitude: 12 mV
Lead Channel Sensing Intrinsic Amplitude: 3.1 mV
Lead Channel Setting Pacing Amplitude: 2.5 V
Lead Channel Setting Pacing Amplitude: 2.5 V
Lead Channel Setting Pacing Amplitude: 2.5 V
Lead Channel Setting Pacing Pulse Width: 0.5 ms
Lead Channel Setting Pacing Pulse Width: 1.2 ms
Lead Channel Setting Sensing Sensitivity: 2 mV
Pulse Gen Model: 3562
Pulse Gen Serial Number: 8109412

## 2023-10-30 ENCOUNTER — Encounter: Payer: Self-pay | Admitting: Internal Medicine

## 2023-11-08 ENCOUNTER — Ambulatory Visit: Attending: Cardiology

## 2023-11-08 DIAGNOSIS — I5022 Chronic systolic (congestive) heart failure: Secondary | ICD-10-CM | POA: Diagnosis not present

## 2023-11-08 DIAGNOSIS — Z950 Presence of cardiac pacemaker: Secondary | ICD-10-CM | POA: Diagnosis not present

## 2023-11-12 NOTE — Progress Notes (Signed)
 EPIC Encounter for ICM Monitoring  Patient Name: Alexander Curtis is a 78 y.o. male Date: 11/12/2023 Primary Care Physican: Roosvelt Colla, MD Primary Cardiologist: Avanell Bob Electrophysiologist: Lawana Pray Bi-V Pacing:  98%      06/23/2022 Office weight: 212 lbs 10/16/2022 Weight: 209 lbs 04/08/2023 Weight: 206-209 lbs 06/01/2023 Weight: 207 lbs 10/08/2023 Weight: 211 lbs   Transmission results reviewed.    Corvue thoracic impedance suggesting normal fluid levels with the exception of possible fluid accumulation from 4/16-4/24.   Prescribed: No diuretic   Recommendations:  No changes.    Follow-up plan: ICM clinic phone appointment on 12/13/2023.   91 day device clinic remote transmission 12/13/2023.   EP/Cardiology Office Visits:  Recall 05/07/2024 with Dr Rodolfo Clan.  Recall 12/19/2023 with Dr Avanell Bob.    Copy of ICM check sent to Dr. Lawana Pray.  3 month ICM trend: 11/08/2023.    12-14 Month ICM trend:     Almyra Jain, RN 11/12/2023 9:00 AM

## 2023-11-22 NOTE — Progress Notes (Signed)
 Remote pacemaker transmission.

## 2023-11-22 NOTE — Addendum Note (Signed)
 Addended by: Lott Rouleau A on: 11/22/2023 12:04 PM   Modules accepted: Orders

## 2023-12-13 ENCOUNTER — Ambulatory Visit: Attending: Cardiology

## 2023-12-13 DIAGNOSIS — Z950 Presence of cardiac pacemaker: Secondary | ICD-10-CM

## 2023-12-13 DIAGNOSIS — I5022 Chronic systolic (congestive) heart failure: Secondary | ICD-10-CM

## 2023-12-14 DIAGNOSIS — F4312 Post-traumatic stress disorder, chronic: Secondary | ICD-10-CM | POA: Insufficient documentation

## 2023-12-14 DIAGNOSIS — H9190 Unspecified hearing loss, unspecified ear: Secondary | ICD-10-CM | POA: Insufficient documentation

## 2023-12-14 DIAGNOSIS — N400 Enlarged prostate without lower urinary tract symptoms: Secondary | ICD-10-CM | POA: Insufficient documentation

## 2023-12-14 DIAGNOSIS — L570 Actinic keratosis: Secondary | ICD-10-CM | POA: Insufficient documentation

## 2023-12-14 DIAGNOSIS — C44310 Basal cell carcinoma of skin of unspecified parts of face: Secondary | ICD-10-CM | POA: Insufficient documentation

## 2023-12-14 DIAGNOSIS — I8310 Varicose veins of unspecified lower extremity with inflammation: Secondary | ICD-10-CM | POA: Insufficient documentation

## 2023-12-14 DIAGNOSIS — K4090 Unilateral inguinal hernia, without obstruction or gangrene, not specified as recurrent: Secondary | ICD-10-CM | POA: Insufficient documentation

## 2023-12-14 DIAGNOSIS — K649 Unspecified hemorrhoids: Secondary | ICD-10-CM | POA: Insufficient documentation

## 2023-12-14 DIAGNOSIS — G43909 Migraine, unspecified, not intractable, without status migrainosus: Secondary | ICD-10-CM | POA: Insufficient documentation

## 2023-12-14 DIAGNOSIS — C4360 Malignant melanoma of unspecified upper limb, including shoulder: Secondary | ICD-10-CM | POA: Insufficient documentation

## 2023-12-14 DIAGNOSIS — H259 Unspecified age-related cataract: Secondary | ICD-10-CM | POA: Insufficient documentation

## 2023-12-15 NOTE — Progress Notes (Signed)
 EPIC Encounter for ICM Monitoring  Patient Name: Alexander Curtis is a 78 y.o. male Date: 12/15/2023 Primary Care Physican: Roosvelt Colla, MD Primary Cardiologist: Avanell Bob Electrophysiologist: Lawana Pray Bi-V Pacing:  98%      06/23/2022 Office weight: 212 lbs 10/16/2022 Weight: 209 lbs 04/08/2023 Weight: 206-209 lbs 06/01/2023 Weight: 207 lbs 10/08/2023 Weight: 211 lbs   Spoke with patient and heart failure questions reviewed.  Transmission results reviewed.  Pt asymptomatic for fluid accumulation.  Reports feeling well at this time and voices no complaints.     Corvue thoracic impedance suggesting normal fluid levels with the exception of possible fluid accumulation from 5/11-5/15.   Prescribed: No diuretic   Recommendations:  No changes and encouraged to call if experiencing any fluid symptoms.   Follow-up plan: ICM clinic phone appointment on 02/07/2024.   91 day device clinic remote transmission 01/17/2024.   EP/Cardiology Office Visits:  Recall 05/07/2024 with Dr Rodolfo Clan.  Recall 01/08/2024 with Dr Avanell Bob.    Copy of ICM check sent to Dr. Lawana Pray.   3 month ICM trend: 12/13/2023.    12-14 Month ICM trend:     Almyra Jain, RN 12/15/2023 10:26 AM

## 2024-01-17 ENCOUNTER — Ambulatory Visit (INDEPENDENT_AMBULATORY_CARE_PROVIDER_SITE_OTHER): Payer: No Typology Code available for payment source

## 2024-01-17 DIAGNOSIS — I428 Other cardiomyopathies: Secondary | ICD-10-CM | POA: Diagnosis not present

## 2024-01-19 LAB — CUP PACEART REMOTE DEVICE CHECK
Battery Remaining Longevity: 53 mo
Battery Remaining Percentage: 73 %
Battery Voltage: 2.98 V
Brady Statistic AP VP Percent: 29 %
Brady Statistic AP VS Percent: 1 %
Brady Statistic AS VP Percent: 70 %
Brady Statistic AS VS Percent: 1 %
Brady Statistic RA Percent Paced: 27 %
Date Time Interrogation Session: 20250707040013
Implantable Lead Connection Status: 753985
Implantable Lead Connection Status: 753985
Implantable Lead Implant Date: 20151220
Implantable Lead Implant Date: 20160819
Implantable Lead Location: 753858
Implantable Lead Location: 753860
Implantable Lead Model: 5076
Implantable Pulse Generator Implant Date: 20231006
Lead Channel Impedance Value: 390 Ohm
Lead Channel Impedance Value: 400 Ohm
Lead Channel Impedance Value: 800 Ohm
Lead Channel Pacing Threshold Amplitude: 0.75 V
Lead Channel Pacing Threshold Amplitude: 0.75 V
Lead Channel Pacing Threshold Amplitude: 1.25 V
Lead Channel Pacing Threshold Pulse Width: 0.5 ms
Lead Channel Pacing Threshold Pulse Width: 0.5 ms
Lead Channel Pacing Threshold Pulse Width: 1.2 ms
Lead Channel Sensing Intrinsic Amplitude: 10.4 mV
Lead Channel Sensing Intrinsic Amplitude: 3.1 mV
Lead Channel Setting Pacing Amplitude: 2.5 V
Lead Channel Setting Pacing Amplitude: 2.5 V
Lead Channel Setting Pacing Amplitude: 2.5 V
Lead Channel Setting Pacing Pulse Width: 0.5 ms
Lead Channel Setting Pacing Pulse Width: 1.2 ms
Lead Channel Setting Sensing Sensitivity: 2 mV
Pulse Gen Model: 3562
Pulse Gen Serial Number: 8109412

## 2024-01-27 ENCOUNTER — Ambulatory Visit: Payer: Self-pay | Admitting: Cardiology

## 2024-02-07 ENCOUNTER — Ambulatory Visit: Attending: Cardiology

## 2024-02-07 DIAGNOSIS — I5022 Chronic systolic (congestive) heart failure: Secondary | ICD-10-CM

## 2024-02-07 DIAGNOSIS — Z950 Presence of cardiac pacemaker: Secondary | ICD-10-CM

## 2024-02-10 NOTE — Progress Notes (Signed)
 EPIC Encounter for ICM Monitoring  Patient Name: Alexander Curtis is a 78 y.o. male Date: 02/10/2024 Primary Care Physican: Randol Dawes, MD Primary Cardiologist: Okey Electrophysiologist: Inocencio Bi-V Pacing:  98%      06/23/2022 Office weight: 212 lbs 10/16/2022 Weight: 209 lbs 06/01/2023 Weight: 207 lbs 10/08/2023 Weight: 211 lbs 11/12/2023 Office Visit: 213.6 lbs 02/10/2024 Home Weight: 213 lbs  Spoke with patient and heart failure questions reviewed.  Transmission results reviewed.  Pt asymptomatic for fluid accumulation.  Reports feeling well at this time and voices no complaints.     Corvue thoracic impedance suggesting normal fluid levels with the exception of possible fluid accumulation from 6/30-7/9.   Prescribed: No diuretic   Recommendations:  No changes and encouraged to call if experiencing any fluid symptoms.   Follow-up plan: ICM clinic phone appointment on 03/14/2024.   91 day device clinic remote transmission 04/17/2024.   EP/Cardiology Office Visits:  Recall 05/07/2024 with Dr Fernande.  Recall 01/08/2024 with Dr Okey.    Copy of ICM check sent to Dr. Inocencio.   3 month ICM trend: 02/07/2024.    12-14 Month ICM trend:     Alexander GORMAN Garner, RN 02/10/2024 4:38 PM

## 2024-03-14 ENCOUNTER — Ambulatory Visit: Attending: Cardiology

## 2024-03-14 DIAGNOSIS — Z950 Presence of cardiac pacemaker: Secondary | ICD-10-CM

## 2024-03-14 DIAGNOSIS — I5022 Chronic systolic (congestive) heart failure: Secondary | ICD-10-CM

## 2024-03-15 NOTE — Progress Notes (Signed)
 EPIC Encounter for ICM Monitoring  Patient Name: Alexander Curtis is a 78 y.o. male Date: 03/15/2024 Primary Care Physican: Randol Dawes, MD Primary Cardiologist: Okey Electrophysiologist: Inocencio Bi-V Pacing:  98%      06/23/2022 Office weight: 212 lbs 10/16/2022 Weight: 209 lbs 06/01/2023 Weight: 207 lbs 10/08/2023 Weight: 211 lbs 11/12/2023 Office Visit: 213.6 lbs 02/10/2024 Home Weight: 213 lbs   Spoke with patient and heart failure questions reviewed.  Transmission results reviewed.  Pt asymptomatic for fluid accumulation.  Reports feeling well at this time and voices no complaints.   He has been working out at [employer]   Corvue thoracic impedance suggesting intermittent days with possible fluid accumulation within the last month.   Prescribed: No diuretic   Recommendations:  No changes and encouraged to call if experiencing any fluid symptoms.   Follow-up plan: ICM clinic phone appointment on 04/24/2024.  91 day device clinic remote transmission 04/17/2024.   EP/Cardiology Office Visits:  Recall 05/07/2024 with Dr Fernande.  Recall 01/08/2024 with Dr Okey.    Copy of ICM check sent to Dr. Inocencio.   3 month ICM trend: 03/14/2024.    12-14 Month ICM trend:     Mitzie GORMAN Garner, RN 03/15/2024 4:04 PM

## 2024-04-17 ENCOUNTER — Ambulatory Visit (INDEPENDENT_AMBULATORY_CARE_PROVIDER_SITE_OTHER): Payer: No Typology Code available for payment source

## 2024-04-17 DIAGNOSIS — I5022 Chronic systolic (congestive) heart failure: Secondary | ICD-10-CM

## 2024-04-19 LAB — CUP PACEART REMOTE DEVICE CHECK
Battery Remaining Longevity: 50 mo
Battery Remaining Percentage: 69 %
Battery Voltage: 2.98 V
Brady Statistic AP VP Percent: 28 %
Brady Statistic AP VS Percent: 1 %
Brady Statistic AS VP Percent: 70 %
Brady Statistic AS VS Percent: 1 %
Brady Statistic RA Percent Paced: 27 %
Date Time Interrogation Session: 20251007011752
Implantable Lead Connection Status: 753985
Implantable Lead Connection Status: 753985
Implantable Lead Implant Date: 20151220
Implantable Lead Implant Date: 20160819
Implantable Lead Location: 753858
Implantable Lead Location: 753860
Implantable Lead Model: 5076
Implantable Pulse Generator Implant Date: 20231006
Lead Channel Impedance Value: 390 Ohm
Lead Channel Impedance Value: 400 Ohm
Lead Channel Impedance Value: 890 Ohm
Lead Channel Pacing Threshold Amplitude: 0.75 V
Lead Channel Pacing Threshold Amplitude: 0.75 V
Lead Channel Pacing Threshold Amplitude: 1.25 V
Lead Channel Pacing Threshold Pulse Width: 0.5 ms
Lead Channel Pacing Threshold Pulse Width: 0.5 ms
Lead Channel Pacing Threshold Pulse Width: 1.2 ms
Lead Channel Sensing Intrinsic Amplitude: 3.1 mV
Lead Channel Sensing Intrinsic Amplitude: 7.9 mV
Lead Channel Setting Pacing Amplitude: 2.5 V
Lead Channel Setting Pacing Amplitude: 2.5 V
Lead Channel Setting Pacing Amplitude: 2.5 V
Lead Channel Setting Pacing Pulse Width: 0.5 ms
Lead Channel Setting Pacing Pulse Width: 1.2 ms
Lead Channel Setting Sensing Sensitivity: 2 mV
Pulse Gen Model: 3562
Pulse Gen Serial Number: 8109412

## 2024-04-20 NOTE — Progress Notes (Signed)
 Remote PPM Transmission

## 2024-04-23 ENCOUNTER — Ambulatory Visit: Payer: Self-pay | Admitting: Cardiology

## 2024-04-24 ENCOUNTER — Ambulatory Visit: Attending: Cardiology

## 2024-04-24 DIAGNOSIS — Z950 Presence of cardiac pacemaker: Secondary | ICD-10-CM | POA: Diagnosis not present

## 2024-04-24 DIAGNOSIS — I5022 Chronic systolic (congestive) heart failure: Secondary | ICD-10-CM

## 2024-04-24 NOTE — Progress Notes (Signed)
 Remote PPM Transmission
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
## 2024-05-01 ENCOUNTER — Ambulatory Visit: Attending: Cardiology

## 2024-05-01 DIAGNOSIS — Z950 Presence of cardiac pacemaker: Secondary | ICD-10-CM

## 2024-05-01 DIAGNOSIS — I5022 Chronic systolic (congestive) heart failure: Secondary | ICD-10-CM

## 2024-05-02 ENCOUNTER — Telehealth: Payer: Self-pay

## 2024-05-02 NOTE — Telephone Encounter (Signed)
 Remote ICM transmission received.  Attempted call to patient regarding ICM remote transmission.  Left detailed message per DPR with ICM phone number to return call for any questions, concerns or fluid symptoms.

## 2024-05-02 NOTE — Progress Notes (Signed)
 EPIC Encounter for ICM Monitoring  Patient Name: Alexander Curtis is a 78 y.o. male Date: 05/02/2024 Primary Care Physican: Randol Dawes, MD Primary Cardiologist: Okey Electrophysiologist: Inocencio Bi-V Pacing:  98%      06/23/2022 Office weight: 212 lbs 10/16/2022 Weight: 209 lbs 06/01/2023 Weight: 207 lbs 10/08/2023 Weight: 211 lbs 11/12/2023 Office Visit: 213.6 lbs 02/10/2024 Home Weight: 213 lbs   Attempted call to patient and unable to reach.  Left detailed message per DPR regarding transmission.  Transmission results reviewed.      Since 04/24/2024 ICM Remote Transmission: Corvue thoracic impedance suggesting fluid levels returned to normal.   Prescribed: No diuretic   Recommendations:   Left voice mail with ICM number and encouraged to call if experiencing any fluid symptoms.    Follow-up plan: ICM clinic phone appointment on 05/29/2024.  91 day device clinic remote transmission 07/17/2024.   EP/Cardiology Office Visits:  05/16/2024 with Daphne Barrack, NP.  06/27/2024 with Dr Okey.    Copy of ICM check sent to Dr. Inocencio.     Remote monitoring is medically necessary for Heart Failure Management.    Daily Thoracic Impedance ICM trend: 02/01/2024 through 05/01/2024.    12-14 Month Thoracic Impedance ICM trend:     Mitzie GORMAN Garner, RN 05/02/2024 2:20 PM
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
## 2024-05-16 ENCOUNTER — Encounter: Payer: Self-pay | Admitting: Pulmonary Disease

## 2024-05-16 ENCOUNTER — Ambulatory Visit: Attending: Pulmonary Disease | Admitting: Pulmonary Disease

## 2024-05-16 VITALS — BP 132/76 | HR 69 | Ht 73.0 in | Wt 225.8 lb

## 2024-05-16 DIAGNOSIS — I10 Essential (primary) hypertension: Secondary | ICD-10-CM | POA: Diagnosis not present

## 2024-05-16 DIAGNOSIS — Z950 Presence of cardiac pacemaker: Secondary | ICD-10-CM

## 2024-05-16 DIAGNOSIS — I459 Conduction disorder, unspecified: Secondary | ICD-10-CM | POA: Diagnosis not present

## 2024-05-16 LAB — CUP PACEART INCLINIC DEVICE CHECK
Date Time Interrogation Session: 20251104162945
Implantable Lead Connection Status: 753985
Implantable Lead Connection Status: 753985
Implantable Lead Implant Date: 20151220
Implantable Lead Implant Date: 20160819
Implantable Lead Location: 753858
Implantable Lead Location: 753860
Implantable Lead Model: 5076
Implantable Pulse Generator Implant Date: 20231006
Pulse Gen Model: 3562
Pulse Gen Serial Number: 8109412

## 2024-05-16 NOTE — Patient Instructions (Signed)
 Medication Instructions:  Your physician recommends that you continue on your current medications as directed. Please refer to the Current Medication list given to you today.  *If you need a refill on your cardiac medications before your next appointment, please call your pharmacy*  Lab Work: None ordered If you have labs (blood work) drawn today and your tests are completely normal, you will receive your results only by: MyChart Message (if you have MyChart) OR A paper copy in the mail If you have any lab test that is abnormal or we need to change your treatment, we will call you to review the results.  Follow-Up: At [facility], you and your health needs are our priority.  As part of our continuing mission to provide you with exceptional heart care, our providers are all part of one team.  This team includes your primary Cardiologist (physician) and Advanced Practice Providers or APPs (Physician Assistants and Nurse Practitioners) who all work together to provide you with the care you need, when you need it.  Your next appointment:   1 year(s)  Provider:   You may see Will Gladis Norton, MD or one of the following Advanced Practice Providers on your designated Care Team:   Charlies Arthur, [place] Ozell Jodie Passey, PA-C Suzann Riddle, NP Daphne Barrack, NP Artist Pouch, PA-C

## 2024-05-23 ENCOUNTER — Ambulatory Visit: Payer: Self-pay | Admitting: Cardiology

## 2024-05-25 ENCOUNTER — Telehealth: Payer: Self-pay | Admitting: Family Medicine

## 2024-05-25 NOTE — Telephone Encounter (Signed)
 Called patient and he is seeing pulm in 2 weeks he will talk to them.

## 2024-05-25 NOTE — Telephone Encounter (Signed)
 Copied from CRM [id number]. Topic: General - Other >> May 23, 2024  9:33 AM Frederich PARAS wrote: Reason for CRM: pt called in he says he was told to have a CT Scan, he says he was approved for community service. Pt does not know what they ordered , he did mention he took ct scans with the pulmonologist. Please reach out to pt for clarification.

## 2024-05-29 ENCOUNTER — Ambulatory Visit: Attending: Cardiology

## 2024-05-29 DIAGNOSIS — Z950 Presence of cardiac pacemaker: Secondary | ICD-10-CM

## 2024-05-29 DIAGNOSIS — I5022 Chronic systolic (congestive) heart failure: Secondary | ICD-10-CM | POA: Diagnosis not present

## 2024-05-29 NOTE — Progress Notes (Signed)
 EPIC Encounter for ICM Monitoring  Patient Name: Alexander Curtis is a 78 y.o. male Date: 05/29/2024 Primary Care Physican: Randol Dawes, MD Primary Cardiologist: Okey Electrophysiologist: Inocencio Bi-V Pacing:  99%      06/23/2022 Office weight: 212 lbs 10/16/2022 Weight: 209 lbs 06/01/2023 Weight: 207 lbs 10/08/2023 Weight: 211 lbs 11/12/2023 Office Visit: 213.6 lbs 02/10/2024 Home Weight: 213 lbs 05/16/2024 Weight: 225 lbs 05/29/2024 Weight: 216 lbs   Spoke with patient and heart failure questions reviewed.  Transmission results reviewed.  Pt asymptomatic for fluid accumulation.  Reports feeling well at this time and voices no complaints.  [place] in December.    Since 05/01/2024 ICM Remote Transmission: Corvue thoracic impedance suggesting possible fluid accumulation from 05/14/2024-05/23/2024 followed by possible dryness starting 05/24/2024 but returned close to baseline.   Prescribed: No diuretic   Recommendations:   No changes and encouraged to call if experiencing any fluid symptoms.     Follow-up plan: ICM clinic phone appointment on 07/03/2024.  91 day device clinic remote transmission 07/17/2024.   EP/Cardiology Office Visits:  Recall 10/30/206 with Dr Inocencio.  06/27/2024 with Dr Okey.    Copy of ICM check sent to Dr. Inocencio.     Remote monitoring is medically necessary for Heart Failure Management.    Daily Thoracic Impedance ICM trend: 02/29/2024 through 05/29/2024.    12-14 Month Thoracic Impedance ICM trend:     Mitzie GORMAN Garner, RN 05/29/2024 11:26 AM

## 2024-06-02 ENCOUNTER — Telehealth: Payer: Self-pay

## 2024-06-02 NOTE — Telephone Encounter (Signed)
 Copied from CRM #[id number]. Topic: Medical Record Request - Other >> Jun 02, 2024  9:14 AM Nathanel DEL wrote: Reason for CRM: pt has referral from [facility], and an appt on 06/06/2024.  He would like to know if you have his CT scans or have access to them when he comes to appt.  He says there is not any need for him to come if the dr does not have his imaging results.  pt would like call back to let him know   Request has been made to [facility].

## 2024-06-07 ENCOUNTER — Ambulatory Visit

## 2024-06-07 VITALS — BP 124/72 | HR 62 | Temp 97.9°F | Ht 73.0 in | Wt 221.0 lb

## 2024-06-07 DIAGNOSIS — Z950 Presence of cardiac pacemaker: Secondary | ICD-10-CM | POA: Diagnosis not present

## 2024-06-07 DIAGNOSIS — Z7739 Contact with and (suspected) exposure to other war theater: Secondary | ICD-10-CM | POA: Diagnosis not present

## 2024-06-07 DIAGNOSIS — R918 Other nonspecific abnormal finding of lung field: Secondary | ICD-10-CM | POA: Diagnosis not present

## 2024-06-07 NOTE — Patient Instructions (Signed)
  VISIT SUMMARY: You came in for a pulmonary consultation due to recent changes in a lung nodule that has been monitored for years. You have a history of small lesions in your lungs, which have been mostly stable until recently. From 2021 till now, there appears to be a slow growth on this nodule. We discussed the potential need for a biopsy to determine the nature of the nodule.  YOUR PLAN: RIGHT LOWER LOBE PULMONARY NODULE: The nodule in your right lower lung has shown slow growth and increased density. While you are a nonsmoker, your history of Agent Orange exposure and previous skin cancers raises some concern. -We will discuss the biopsy approach with the radiology and pulmonology team. -We plan to schedule the biopsy after Christmas, considering your travel plans. -If a bronchoscopy is chosen, we will order an updated high-resolution CT scan to ensure compatibility with robotic bronchoscopy. -We will schedule a follow-up appointment for the third week of January to review the biopsy results.    Contains text generated by [employer]bridge.

## 2024-06-07 NOTE — Progress Notes (Signed)
 New Patient Pulmonology Office Visit   Subjective:  [name]: Alexander Curtis, male    DOB: 1945/08/09  MRN: [id number]  Referred by: Onetha Particia SAILOR, MD  CC:  Chief Complaint  Patient presents with   Consult    Abnormal ct     HPI Alexander Curtis is a 78 y.o. male who is referred to us  from [place] for lung nodule.   Discussed the use of AI scribe software for clinical note transcription with the patient, who gave verbal consent to proceed.  History of Present Illness Alexander Curtis is a 78 year old male who presents for a pulmonary consultation due to recent changes in a lung nodule. He was referred for a pulmonary consultation due to changes in a lung nodule.  He has had ground glass lesions in both lung bases for years, possibly since his time in [place]. These lesions have been monitored with annual CT scans for the past six years, and they have remained stable until recently as per his wife who is a retied Nutritional Therapist.  No respiratory symptoms such as dyspnea, cough, or shortness of breath on exertion. He has never smoked.  His past medical history includes a significant cardiac event six years ago, characterized by a sudden onset of third-degree heart block with a heart rate of 22, which was treated with a pacemaker. There is no history of myocardial infarction or elevated cardiac enzymes. He follows up with a cardiologist at [facility]  He has a history of multiple skin cancers, including melanoma, with surgeries performed at the [facility]. He attributes these to exposure to Agent Orange during his [place] in [place]. He notes that every skin lesion has been positive for some type of cancer.      ROS Review of symptoms negative except mentioned above   Allergies: Horse-derived products, Pravastatin, and Adhesive [tape]  Current Outpatient Medications:    atorvastatin  (LIPITOR) 40 MG tablet, Take 40 mg by mouth at bedtime., Disp: , Rfl:    carvedilol  (COREG ) 6.25  MG tablet, TAKE 1 TABLET(6.25 MG) BY MOUTH TWICE DAILY WITH A MEAL, Disp: 180 tablet, Rfl: 1   CINNAMON PO, Take 1,000 mg by mouth 2 (two) times daily., Disp: , Rfl:    Coenzyme Q10 (COQ10) 200 MG CAPS, Take 200 mg by mouth 2 (two) times daily., Disp: , Rfl:    fish oil-omega-3 fatty acids  1000 MG capsule, Take 1 g by mouth 3 (three) times daily. , Disp: , Rfl:    fluticasone (FLONASE) 50 MCG/ACT nasal spray, Place 1 spray into both nostrils daily as needed for allergies or rhinitis., Disp: , Rfl:    gabapentin (NEURONTIN) 100 MG capsule, Take 100 mg by mouth 2 (two) times daily., Disp: , Rfl:    lisinopril  (ZESTRIL ) 10 MG tablet, Take 10 mg by mouth daily., Disp: , Rfl:    metFORMIN  (GLUCOPHAGE ) 500 MG tablet, Take 500 mg by mouth 2 (two) times daily with a meal., Disp: , Rfl:    Multiple Vitamins-Minerals (MENS 50+ MULTI VITAMIN/MIN PO), Take 1 tablet by mouth daily., Disp: , Rfl:    Turmeric 500 MG CAPS, Take 500 mg by mouth daily., Disp: , Rfl:  No current facility-administered medications for this visit.  Facility-Administered Medications Ordered in Other Visits:    influenza  inactive virus vaccine (FLUZONE/FLUARIX) injection 0.5 mL, 0.5 mL, Intramuscular, Once, Randol Dawes, MD Past Medical History:  Diagnosis Date   Arthritis of shoulder  right (CT done 11/2018)   Atrial enlargement, left    Bradycardia 06/30/2014   Diabetes mellitus, type 2 (HCC)    Diabetic peripheral neuropathy (HCC) 01/24/2017   [facility] and [facility]   HLD (hyperlipidemia)    Hypertension 05/2013   LBBB (left bundle branch block)    Melanoma (HCC) 1968   R arm   Mitral regurgitation    Mobitz type 2 second degree heart block    a. s/p Medtronic Adapta L model ADDRL 1 (serial number [id number]) pacemaker. 07/02/14 b.  upgrade to STJ CRTP 02/2015   Non-ischemic cardiomyopathy (HCC)    a. felt to be 2/2 RV pacing   Obstructive sleep apnea    compliant with CPAP   Pacemaker    a.  Medtronic Adapta L model ADDRL 1 (serial number [id number]) pacemaker.    Pulmonary nodules    noted on CT of shoulder 03/2016, up to 5mm in size on CT chest 04/2016. repeat 1 year due to h/o melanoma   Syncope 2011   a. 2011 -negative workup except LBBB. thought to be vasovagal   Tricuspid regurgitation    Past Surgical History:  Procedure Laterality Date   EP IMPLANTABLE DEVICE N/A 03/01/2015   STJ CRTP upgrade by Dr Kelsie   FINGER TENDON REPAIR     R 2nd and 3rd finger   INGUINAL HERNIA REPAIR     bilateral   KNEE ARTHROSCOPY  02/2011   L knee for meniscal tear. Dr. Vernetta   PERMANENT PACEMAKER INSERTION N/A 07/01/2014   MDT ADDRL1 pacemaker implanted for Mobitz II Dr Kelsie   [facility] GENERATOR CHANGEOUT N/A 04/17/2022   Procedure: PPM GENERATOR CHANGEOUT;  Surgeon: Fernande Elspeth BROCKS, MD;  Location: [facility] INVASIVE CV LAB;  Service: Cardiovascular;  Laterality: N/A;   UVULECTOMY     laser treatment for sleep apnea (NOT UP3)   VASECTOMY     Family History  Problem Relation Age of Onset   Dementia Mother    Cancer Mother        ?type, was told it contributed to death, per pt   Melanoma Mother 62   Stroke Father 55       cerebral aneurysm   Diabetes Sister        borderline, obese   Stroke Sister 56   Diabetes Maternal Grandmother    Heart disease Maternal Grandfather    Heart attack Maternal Grandfather    Dementia Paternal Aunt    Parkinson's disease Maternal Aunt    Hypertension Neg Hx    Social History   Socioeconomic History   Marital status: Married    Spouse name: Not on file   Number of children: 4   Years of education: Not on file   Highest education level: Not on file  Occupational History   Occupation: retired  Tobacco Use   Smoking status: Never   Smokeless tobacco: Never  Vaping Use   Vaping status: Never Used  Substance and Sexual Activity   Alcohol use: Yes    Comment: rare, some months without any   Drug use: No   Sexual activity: Yes    Partners:  Female  Other Topics Concern   Not on file  Social History Narrative   Lives with wife, 1 dog.  Daughter, Rocky, in [place], daugher (Amy) in [place], son in [place], son in [place].10 grandchildren   Retired school principal   Sold his house and moved to a townhome near  [place]. Has a mountain ch[place]et.   [facility] even [facility]).   Social Drivers of Corporate Investment Banker Strain: Not on file  Food Insecurity: Not on file  Transportation Needs: Not on file  Physical Activity: Not on file  Stress: Not on file  Social Connections: Not on file  Intimate Partner Violence: Not on file        Objective:  BP 124/72   Pulse 62   Temp 97.9 F (36.6 C) (Oral)   Ht 6' 1 (1.854 m)   Wt 221 lb (100.2 kg)   SpO2 96%   BMI 29.16 kg/m    Physical Exam Constitutional:      General: He is not in acute distress.    Appearance: Normal appearance.  HENT:     Mouth/Throat:     Mouth: Mucous membranes are moist.  Cardiovascular:     Rate and Rhythm: Normal rate.  Pulmonary:     Effort: No respiratory distress.     Breath sounds: No wheezing or rales.  Musculoskeletal:     Right lower leg: No edema.     Left lower leg: No edema.  Skin:    General: Skin is warm.  Neurological:     Mental Status: He is alert and oriented to person, place, and time.  Psychiatric:        Mood and Affect: Mood normal.     Diagnostic Review:    Pft     No data to display               [facility]24    RADIOLOGY Chest CT: Right lower lobe nodule with increased solid component density, may be slight increase in size located below the pleura (05/17/2024), other 2 sub centimeter nodules on left are stable from 2023 Chest CT: Right lower lobe nodule, stable to mild growth from 2021  (06/2022) Reviewed CT chest from 2021 till 2025      Assessment & Plan:   Assessment & Plan Lung nodules Reviewed CT chest from 2021  till 2025 Rest of the subcentimeter nodules are small and unchanged, however the right lower lobe ground glass nodule has slow increase and solid component and may be slight increase in size as well over several years This could be a slow-growing malignancy Discussed potential need for biopsy versus close monitoring given the size.  Patient/wife are both eager to proceed with biopsy as they believe that he has been a high risk for malignancy throughout his life, especially in regards to skin cancers This is a peripheral part solid nodule.  Will discuss with pulmonary team and interventional radiology and determine the best approach for biopsy We discussed about other potential options including continued follow up, CT guided biopsy, surgical biopsy, bronchoscopic bx. I explained the biopsy procedure at depth.  Risks and benefits of bronchoscopy +/- biopsy and IR guided bx both discussed with the patient and his family in details and all the questions were answered. They understand the risk of bleeding, pneumothorax, injury to blood vessels and would like to proceed  with biopsy.  However they will be out of country from December till January for and would like to schedule the procedure after that.    History of pacemaker Follow-up with cardiology    H/O agent Orange exposure      Thank you for the opportunity to take part in the care of Alexander Curtis  Return in about 8 weeks (around 08/03/2024).   Leilyn Frayre Pleas, MD [facility] Office: [phone]  I personally spent a total of 60 minutes in the care of the patient today including performing a medically appropriate exam/evaluation, counseling and educating, referring and communicating with other health care professionals, documenting clinical information in the EHR, independently interpreting results, communicating results, and coordinating care.

## 2024-06-12 ENCOUNTER — Telehealth: Payer: Self-pay

## 2024-06-12 NOTE — Telephone Encounter (Signed)
 Spoke with patient's wife to reschedule follow up, Alexander Curtis mentioned that this was a biopsy follow up, but the biopsy is not scheduled. Per Dr. Elvin notes, We will discuss the biopsy approach with the radiology and pulmonology team. -We plan to schedule the biopsy after Christmas, considering your travel plans. -If a bronchoscopy is chosen, we will order an updated high-resolution CT scan to ensure compatibility with robotic bronchoscopy.  Patient would like to know if there is any update on scheduling a biopsy.

## 2024-06-13 ENCOUNTER — Telehealth: Payer: Self-pay

## 2024-06-13 NOTE — Telephone Encounter (Addendum)
 Open in error

## 2024-06-14 NOTE — Telephone Encounter (Signed)
 Biopsy hasn't been set up yet Since they were travelling around christmas my plan was to get it set up first or second week of January depending on endoscopy schedule. Yes, its a good idea to see cardiology for clearance. If they can see heart doctor before Jan 5, we can try to set up bronchoscopy that week or the week after.

## 2024-06-14 NOTE — Telephone Encounter (Signed)
 Called- spoke to patient's wife per dpr. She wants to know the date of bronchoscopy and also if patient should get a clearance from his cardiologist  before biopsy. Since he has a pacemaker.

## 2024-06-16 NOTE — Telephone Encounter (Signed)
 Called and spoke to pt. Advised of recommendations per Dr. Pleas. Pt states he has a cardiology appt on 06/27/24, and their office will fax over clearance afterwards. NFN.

## 2024-06-23 ENCOUNTER — Telehealth: Payer: Self-pay

## 2024-06-23 DIAGNOSIS — R911 Solitary pulmonary nodule: Secondary | ICD-10-CM | POA: Insufficient documentation

## 2024-06-23 NOTE — Telephone Encounter (Signed)
 Please schedule the following:  Provider performing procedure:Dr Byrum Diagnosis: right lung subsolid nodule Which side if for nodule / mass? right Procedure: navigational bronchoscopy  Has patient been spoken to by Provider and given informed consent? yes Anesthesia: GA Do you need Fluro? yes Duration of procedure: 1 hour Date: 07/24/2024 Alternate Date: 07/25/2024  Time: AM/ PM Location: right lower lobe  Does patient have OSA?  DM?  Or Latex allergy?  Medication Restriction/ Anticoagulate/Antiplatelet: none Pre-op Labs Ordered:determined by Anesthesia Imaging request: needs updated CT chest (ordered)  (If, SuperDimension CT Chest, please have STAT courier sent to ENDO)

## 2024-06-23 NOTE — Telephone Encounter (Signed)
 Thank you. Called pt and spoke with Dr Shelah. Will set up a bronchoscopy on Dec 12 week.

## 2024-06-23 NOTE — Telephone Encounter (Signed)
 Called pt back. Spoke to wife. Discussed risk and benefits of biopsy.  Especially given small size of the nodule and small size of the solid component, will be difficult to visualize on CT chest or radial ultrasound during the procedure. However wife reports they don't want to worry everyday about the possibility of cancer and requests biopsy to be performed. They understand the risk of bleeding and pneumothorax. Discussed with Dr Shelah and reviewed images together. He has kindly offered to perform biopsy. If results are negative, we will continue to follow the nodule radiologically.   I explained the bronchoscopic biopsy procedure at depth.  We discussed about other potential options including continued follow up, CT guided biopsy, surgical biopsy. Risks and benefits of bronchoscopy +/- biopsy discussed with the patient and his family in details and all the questions were answered. They understand the risk of bleeding, pneumothorax, injury to blood vessels and would like to proceed for the bronchoscopy with biopsy.

## 2024-06-23 NOTE — Telephone Encounter (Signed)
 Copied from CRM #[id number]. Topic: Appointments - Scheduling Inquiry for Clinic >> Jun 20, 2024  9:45 AM Leila BROCKS wrote: Reason for CRM: Patient [phone] states is asking if there's update on scheduling date/appointment for the biopsy/bronchoscopy? Please advise and call back leave a message or give information to his wife Arlean. >> Jun 23, 2024  9:40 AM Zebedee NOVAK wrote: The Bronch has not been ordered yet will send to clinical >> Jun 23, 2024  8:55 AM Russell PARAS wrote: Pt is reaching out to clinic again regarding scheduling of his biopsy/bronchoscopy, he has not been contacted as of yet for procedure date. He will be out of the country until 07/17/2024, but will then be avail.   Requested call back   CB#  [phone]  Dr. Pleas can you please advise.

## 2024-06-23 NOTE — Telephone Encounter (Signed)
 LVDM about appointment info and lettter. Mailing letter now to patient. Routing to [employer]

## 2024-06-25 NOTE — Progress Notes (Unsigned)
 Cardiology Office Note   Date:  06/27/2024   ID:  Alexander Curtis, Alexander Curtis 02/04/1946, MRN [id number]  PCP:  Randol Dawes, MD  Cardiologist:   Vina Gull, MD   Pt presents for follow up of NICM      History of Present Illness: Alexander Curtis is a 78 y.o. male with a history of LBBB, high degree AV block, DM, HL HTN and OSA (on CPAP)  2011 LBBB Myoview was normal Dec 2015 Underwent PPM placement (Medtronic Adapta)   Echo at that time showed LVEF normal   2016  Echo showed LVEF decreased to 35 to 40%  Felt due to chronic pacing    Aug 2016 Pt Underwent upgrade to BiV pacer  [place]) Feb 2017  Echo LVEF 45 to 50% Jan 2023  Echo LVEF 60 to 65%  MOd LAE, mild LAE  Mild MR   I saw the pt in Oct 2024     Since seen he has done well from a cardiac standpoint   Breathing is good   No dizziness No CP   Active    He is being evaluated for a robotic bronchoscopy    Current Meds  Medication Sig   atorvastatin  (LIPITOR) 40 MG tablet Take 40 mg by mouth at bedtime.   carvedilol  (COREG ) 6.25 MG tablet TAKE 1 TABLET(6.25 MG) BY MOUTH TWICE DAILY WITH A MEAL   CINNAMON PO Take 1,000 mg by mouth 2 (two) times daily.   Coenzyme Q10 (COQ10) 200 MG CAPS Take 200 mg by mouth 2 (two) times daily.   fish oil-omega-3 fatty acids  1000 MG capsule Take 1 g by mouth 3 (three) times daily.    fluticasone (FLONASE) 50 MCG/ACT nasal spray Place 1 spray into both nostrils daily as needed for allergies or rhinitis.   gabapentin (NEURONTIN) 100 MG capsule Take 100 mg by mouth 2 (two) times daily.   lisinopril  (ZESTRIL ) 5 MG tablet Take 1 tablet (5 mg total) by mouth daily.   metFORMIN  (GLUCOPHAGE ) 500 MG tablet Take 500 mg by mouth 2 (two) times daily with a meal.   Multiple Vitamins-Minerals (MENS 50+ MULTI VITAMIN/MIN PO) Take 1 tablet by mouth daily.   Turmeric 500 MG CAPS Take 500 mg by mouth daily.   [DISCONTINUED] lisinopril  (ZESTRIL ) 10 MG tablet Take 10 mg by mouth daily.     Allergies:   Horse-derived  products, Pravastatin, and Adhesive [tape]   Past Medical History:  Diagnosis Date   Arthritis of shoulder    right (CT done 11/2018)   Atrial enlargement, left    Bradycardia 06/30/2014   Diabetes mellitus, type 2 (HCC)    Diabetic peripheral neuropathy (HCC) 01/24/2017   [facility] and [facility]   HLD (hyperlipidemia)    Hypertension 05/2013   LBBB (left bundle branch block)    Melanoma (HCC) 1968   R arm   Mitral regurgitation    Mobitz type 2 second degree heart block    a. s/p Medtronic Adapta L model ADDRL 1 (serial number [id number]) pacemaker. 07/02/14 b.  upgrade to STJ CRTP 02/2015   Non-ischemic cardiomyopathy (HCC)    a. felt to be 2/2 RV pacing   Obstructive sleep apnea    compliant with CPAP   Pacemaker    a. Medtronic Adapta L model ADDRL 1 (serial number [id number]) pacemaker.    Pulmonary nodules    noted on CT of shoulder 03/2016, up to 5mm in size on CT  chest 04/2016. repeat 1 year due to h/o melanoma   Syncope 2011   a. 2011 -negative workup except LBBB. thought to be vasovagal   Tricuspid regurgitation     Past Surgical History:  Procedure Laterality Date   EP IMPLANTABLE DEVICE N/A 03/01/2015   STJ CRTP upgrade by Dr Kelsie   FINGER TENDON REPAIR     R 2nd and 3rd finger   INGUINAL HERNIA REPAIR     bilateral   KNEE ARTHROSCOPY  02/2011   L knee for meniscal tear. Dr. Vernetta   PERMANENT PACEMAKER INSERTION N/A 07/01/2014   MDT ADDRL1 pacemaker implanted for Mobitz II Dr Kelsie   [facility] GENERATOR CHANGEOUT N/A 04/17/2022   Procedure: PPM GENERATOR CHANGEOUT;  Surgeon: Fernande Elspeth BROCKS, MD;  Location: [facility] INVASIVE CV LAB;  Service: Cardiovascular;  Laterality: N/A;   UVULECTOMY     laser treatment for sleep apnea (NOT UP3)   VASECTOMY       Social History:  The patient  reports that he has never smoked. He has never used smokeless tobacco. He reports current alcohol use. He reports that he does not use drugs.   Family History:  The  patient's family history includes Cancer in his mother; Dementia in his mother and paternal aunt; Diabetes in his maternal grandmother and sister; Heart attack in his maternal grandfather; Heart disease in his maternal grandfather; Melanoma (age of onset: 11) in his mother; Parkinson's disease in his maternal aunt; Stroke (age of onset: 83) in his father; Stroke (age of onset: 63) in his sister.    ROS:  Please see the history of present illness. All other systems are reviewed and  Negative to the above problem except as noted.    PHYSICAL EXAM: VS:  BP (!) 104/40   Pulse (!) 59   Ht 6' 1 (1.854 m)   Wt 218 lb (98.9 kg)   SpO2 98%   BMI 28.76 kg/m   GEN: Well nourished, well developed, in no acute distress  HEENT: normal  Neck: JVP normal  No carotid bruits Cardiac: RRR; no murmur   Respiratory:  clear to auscultation GI: soft, nontender, No hepatomegaly  MS: no deformity Moving all extremities   Ext  No LE edema   EKG:  EKG is not ordered today.     Echo Jan 2023  1. Left ventricular ejection fraction, by estimation, is 60 to 65%. The  left ventricle has normal function. The left ventricle has no regional  wall motion abnormalities. There is mild left ventricular hypertrophy.  Left ventricular diastolic parameters  are consistent with Grade I diastolic dysfunction (impaired relaxation).   2. Right ventricular systolic function is normal. The right ventricular  size is normal. There is normal pulmonary artery systolic pressure. The  estimated right ventricular systolic pressure is 24.7 mmHg.   3. Left atrial size was moderately dilated.   4. Right atrial size was mildly dilated.   5. The mitral valve is normal in structure. Mild mitral valve  regurgitation. No evidence of mitral stenosis.   6. The aortic valve is tricuspid. Aortic valve regurgitation is not  visualized. No aortic stenosis is present.   7. The inferior vena cava is normal in size with greater than 50%   respiratory variability, suggesting right atrial pressure of 3 mmHg.   FINDINGS    Lipid Panel    Component Value Date/Time   CHOL 127 09/02/2015 0001   TRIG 65 09/02/2015 0001   HDL 40 09/02/2015 0001  CHOLHDL 3.2 09/02/2015 0001   VLDL 13 09/02/2015 0001   LDLCALC 74 09/02/2015 0001      Wt Readings from Last 3 Encounters:  06/27/24 218 lb (98.9 kg)  06/07/24 221 lb (100.2 kg)  05/16/24 225 lb 12.8 oz (102.4 kg)      ASSESSMENT AND PLAN:  1  Hx HFrEF   Felt pacer induced  Underwent BiV upgrade in 2017  LVEF in 2023 was normal Clinically doing well    Follow  2  Hx HTN   BP is a little low   He is dizzy at times   I would recomm decreasing lisinopril  to 5 mg daily   If BP still low can back down on carvedilol  to 3.125 bid     3  Hx  AV block  S/p PPM then BiV PPM (2016; St Jude)  He had been followed by GORMAN Sage in the past   Will get him set up in EP to see JINNY Kitty     4   Atrial fib  One documented spell of Afib  No recurrence on device interrogations.  Follow   Not on anticoagulation       5   HL Pt on Lipitor  Gets lab at the [place]  I have asked him to get copy and send    6  T2DM     Followed at [facility]   Get labs from there     7  Preop risk assessment   Pt being evaluated for bronchoscopy   Will require general anesthesia  From a cardiac standpoint I think he is at low risk for a major cardiac complication   OK to proceed..   Follow up 12 months with me  Get appt with EP in the inerval    Current medicines are reviewed at length with the patient today.  The patient does not have concerns regarding medicines.  Signed, Vina Gull, MD  06/27/2024 8:25 PM

## 2024-06-27 ENCOUNTER — Encounter: Payer: Self-pay | Admitting: Internal Medicine

## 2024-06-27 ENCOUNTER — Ambulatory Visit: Attending: Internal Medicine | Admitting: Internal Medicine

## 2024-06-27 VITALS — BP 104/40 | HR 59 | Ht 73.0 in | Wt 218.0 lb

## 2024-06-27 DIAGNOSIS — E782 Mixed hyperlipidemia: Secondary | ICD-10-CM

## 2024-06-27 MED ORDER — LISINOPRIL 5 MG PO TABS: 5.0000 mg | ORAL_TABLET | Freq: Every day | ORAL | 3 refills | Status: AC

## 2024-06-27 NOTE — Patient Instructions (Addendum)
 Medication Instructions:  Your physician has recommended you make the following change in your medication:   ** Stop Lisinopril  10mg   ** Begin Lisinopril  5mg  - 1 tablet by mouth daily  *If you need a refill on your cardiac medications before your next appointment, please call your pharmacy*  Lab Work: None ordered.  If you have labs (blood work) drawn today and your tests are completely normal, you will receive your results only by: MyChart Message (if you have MyChart) OR A paper copy in the mail If you have any lab test that is abnormal or we need to change your treatment, we will call you to review the results.  Testing/Procedures: None ordered.   Follow-Up: At [facility], you and your health needs are our priority.  As part of our continuing mission to provide you with exceptional heart care, our providers are all part of one team.  This team includes your primary Cardiologist (physician) and Advanced Practice Providers or APPs (Physician Assistants and Nurse Practitioners) who all work together to provide you with the care you need, when you need it.  Your next appointment:   12 months with Dr Okey  As scheduled with Dr Inocencio, Cardiac Electrophysiology - you are due to be seen again 04/2025

## 2024-06-28 ENCOUNTER — Other Ambulatory Visit: Payer: Self-pay

## 2024-06-30 ENCOUNTER — Telehealth: Payer: Self-pay | Admitting: *Deleted

## 2024-06-30 NOTE — Telephone Encounter (Signed)
 Dave Sans, Powell SAILOR, RN to [employer] and proxy MICHARL HELMES) HF    06/30/24  9:17 AM Good morning, Pre procedure lab work if needed is usually drawn at our lab during your visit or we call to schedule it and let you know lab work is needed.  This is not standard practice when scheduling a bronch.  If anything is ordered, it is usually a specialized CT scan which has been ordered and scheduled already.  It looks like a letter with all the information regarding the CT scan, the procedure and follow up has been sent to you.  Please let us  know if you do not receive it so we can send it to you via mychart.  Have a wonderful weekend and Christmas.  Last read by Arlean JAYSON Hamilton at 9:35AM on 06/30/2024.

## 2024-07-03 ENCOUNTER — Ambulatory Visit: Attending: Cardiology

## 2024-07-03 DIAGNOSIS — Z950 Presence of cardiac pacemaker: Secondary | ICD-10-CM | POA: Diagnosis not present

## 2024-07-03 DIAGNOSIS — I5022 Chronic systolic (congestive) heart failure: Secondary | ICD-10-CM | POA: Diagnosis not present

## 2024-07-03 NOTE — Progress Notes (Signed)
 EPIC Encounter for ICM Monitoring  Patient Name: Alexander Curtis is a 78 y.o. male Date: 07/03/2024 Primary Care Physican: Randol Dawes, MD Primary Cardiologist: Okey Electrophysiologist: Inocencio Bi-V Pacing:  96%      10/08/2023 Weight: 211 lbs 11/12/2023 Office Visit: 213.6 lbs 02/10/2024 Home Weight: 213 lbs 05/16/2024 Weight: 225 lbs 05/29/2024 Weight: 216 lbs 06/27/2024 Office 218 lbs   Spoke with wife and heart failure questions reviewed.  Transmission results reviewed.  Pt asymptomatic for fluid accumulation.   [place] in December and returns 07/16/2024.  Lesions in lungs possibly from agent orange in [place].  Lung bx scheduled 07/24/2024.  Lisinopril  decreased due to hypotension.     Diet:  Wife is monitoring sodium intake and limiting daily intake.    Since 05/29/2024 ICM Remote Transmission: [facility] thoracic impedance suggesting possible fluid accumulation starting 06/29/2024 (out of town for a week and returned 07/02/2024).  Also suggesting possible fluid accumulation from 06/07/2024-06/15/2024.  Report suggesting Possible dryness 06/19/2024-06/21/2024 and was symptomatic with low BP and dizziness but resolved after increased fluid intake.    Prescribed: No diuretic   Recommendations:   No changes and encouraged to call if experiencing any fluid symptoms.  Sent to Dr Okey for review due to possible fluid accumulation for the past week.     Follow-up plan: ICM clinic phone appointment on 07/17/2024 to recheck fluid levels.  91 day device clinic remote transmission 07/17/2024.   EP/Cardiology Office Visits:  Recall 05/11/2025 with Dr Inocencio.  No recall for yearly 2026 appt with Dr Okey (last visit 06/27/2024)   Copy of ICM check sent to Dr. Inocencio.     Remote monitoring is medically necessary for Heart Failure Management.    Daily Thoracic Impedance ICM trend: 04/04/2024 through 07/03/2024.    12-14 Month Thoracic Impedance ICM trend:     Mitzie GORMAN Garner,  RN 07/03/2024 11:40 AM

## 2024-07-04 MED ORDER — POTASSIUM CHLORIDE CRYS ER 10 MEQ PO TBCR: EXTENDED_RELEASE_TABLET | ORAL | 1 refills | Status: DC

## 2024-07-04 MED ORDER — FUROSEMIDE 20 MG PO TABS: ORAL_TABLET | ORAL | 1 refills | Status: DC

## 2024-07-04 NOTE — Addendum Note (Signed)
 Addended by: ARMANDA MITZIE RAMAN on: 07/04/2024 09:16 AM   Modules accepted: Orders

## 2024-07-04 NOTE — Progress Notes (Addendum)
 Spoke with wife and provided Dr Nada recommendations.  Pt can take Lasix  40 mg with 10 KCL if/when weight is up or ankle swelling.   Wife will start with lower dose of 20 mg with potassium if needed due to patient's recent hypotension.  Requested to send prescription to local [facility], [address] [place].  She appreciated the call back.  She will take the Lasix  and potassium on cruise which is scheduled for this week.

## 2024-07-04 NOTE — Progress Notes (Signed)
" °  Received: Nilsa Gull, Vina GAILS, MD  Oliviarose Punch, Mitzie RAMAN, RN Could fill Rx for lasix  40 mg with 10 KCL IF/When weight up, ankle swelling   PRN use only "

## 2024-07-17 ENCOUNTER — Ambulatory Visit

## 2024-07-17 ENCOUNTER — Ambulatory Visit: Payer: No Typology Code available for payment source

## 2024-07-17 DIAGNOSIS — I5022 Chronic systolic (congestive) heart failure: Secondary | ICD-10-CM

## 2024-07-18 ENCOUNTER — Encounter: Payer: Self-pay | Admitting: Acute Care

## 2024-07-18 ENCOUNTER — Ambulatory Visit: Admitting: Acute Care

## 2024-07-19 ENCOUNTER — Ambulatory Visit
Admission: RE | Admit: 2024-07-19 | Discharge: 2024-07-19 | Disposition: A | Discharge status: Home / Self Care | Source: Ambulatory Visit

## 2024-07-19 DIAGNOSIS — R911 Solitary pulmonary nodule: Secondary | ICD-10-CM

## 2024-07-19 NOTE — Progress Notes (Signed)
 No ICM remote transmission received for 07/17/2024 and next ICM transmission scheduled for 07/31/2024.

## 2024-07-20 ENCOUNTER — Encounter: Payer: Self-pay | Admitting: Cardiology

## 2024-07-20 ENCOUNTER — Ambulatory Visit: Payer: Self-pay | Admitting: Cardiology

## 2024-07-20 LAB — CUP PACEART REMOTE DEVICE CHECK
Battery Remaining Longevity: 48 mo
Battery Remaining Percentage: 65 %
Battery Voltage: 2.98 V
Brady Statistic AP VP Percent: 29 %
Brady Statistic AP VS Percent: 1 %
Brady Statistic AS VP Percent: 68 %
Brady Statistic AS VS Percent: 1.1 %
Brady Statistic RA Percent Paced: 27 %
Date Time Interrogation Session: 20260107165617
Implantable Lead Connection Status: 753985
Implantable Lead Connection Status: 753985
Implantable Lead Implant Date: 20151220
Implantable Lead Implant Date: 20160819
Implantable Lead Location: 753858
Implantable Lead Location: 753860
Implantable Lead Model: 5076
Implantable Pulse Generator Implant Date: 20231006
Lead Channel Impedance Value: 360 Ohm
Lead Channel Impedance Value: 400 Ohm
Lead Channel Impedance Value: 930 Ohm
Lead Channel Pacing Threshold Amplitude: 0.5 V
Lead Channel Pacing Threshold Amplitude: 0.75 V
Lead Channel Pacing Threshold Amplitude: 1 V
Lead Channel Pacing Threshold Pulse Width: 0.5 ms
Lead Channel Pacing Threshold Pulse Width: 0.5 ms
Lead Channel Pacing Threshold Pulse Width: 1.2 ms
Lead Channel Sensing Intrinsic Amplitude: 12 mV
Lead Channel Sensing Intrinsic Amplitude: 2.9 mV
Lead Channel Setting Pacing Amplitude: 2.5 V
Lead Channel Setting Pacing Amplitude: 2.5 V
Lead Channel Setting Pacing Amplitude: 2.5 V
Lead Channel Setting Pacing Pulse Width: 0.5 ms
Lead Channel Setting Pacing Pulse Width: 1.2 ms
Lead Channel Setting Sensing Sensitivity: 2 mV
Pulse Gen Model: 3562
Pulse Gen Serial Number: 8109412

## 2024-07-20 NOTE — Progress Notes (Addendum)
 PERIOPERATIVE PRESCRIPTION FOR IMPLANTED CARDIAC DEVICE PROGRAMMING  Patient Information: Name:  Alexander Curtis  DOB:  15-Sep-1945  MRN:  [id number]  Planned Procedure:  Bronchoscopy Surgeon:  Dr. Shelah  Date of Procedure:  07-25-23  Cautery will be used.  Position during surgery:  supine  Device Information:  Clinic EP Physician:  Soyla Norton, MD   Device Type:  Pacemaker Manufacturer and Phone #:  St. Jude/Abbott: [phone] Pacemaker Dependent?:  Yes.   Date of Last Device Check:  07/19/2024 Normal Device Function?:  Yes.    Electrophysiologist's Recommendations:  Have magnet available. Provide continuous ECG monitoring when magnet is used or reprogramming is to be performed.  Procedure may interfere with device function.  Magnet should be placed over device during procedure.  Per Device Clinic Standing Orders, Delon DELENA Sharps, RN  4:13 PM 07/20/2024

## 2024-07-20 NOTE — Progress Notes (Signed)
 Remote PPM Transmission

## 2024-07-21 ENCOUNTER — Other Ambulatory Visit: Payer: Self-pay

## 2024-07-21 ENCOUNTER — Ambulatory Visit: Attending: Cardiology

## 2024-07-21 ENCOUNTER — Encounter (HOSPITAL_COMMUNITY): Payer: Self-pay | Admitting: Emergency Medicine

## 2024-07-21 ENCOUNTER — Telehealth: Payer: Self-pay

## 2024-07-21 DIAGNOSIS — Z950 Presence of cardiac pacemaker: Secondary | ICD-10-CM

## 2024-07-21 DIAGNOSIS — I5022 Chronic systolic (congestive) heart failure: Secondary | ICD-10-CM

## 2024-07-21 NOTE — Anesthesia Preprocedure Evaluation (Signed)
 "                                  Anesthesia Evaluation  Patient identified by MRN, date of birth, ID band Patient awake    Reviewed: Allergy & Precautions, H&P , NPO status , Patient's Chart, lab work & pertinent test results  History of Anesthesia Complications Negative for: history of anesthetic complications  Airway Mallampati: II  TM Distance: >3 FB Neck ROM: Full    Dental no notable dental hx.    Pulmonary sleep apnea  right lung nodule   Pulmonary exam normal breath sounds clear to auscultation       Cardiovascular hypertension, (-) angina (-) Past MI Normal cardiovascular exam+ dysrhythmias + pacemaker  Rhythm:Regular Rate:Normal  Hx of mobitz 2, s/p PPM. St jude, BiV. A sense V pace. CRT, Hx of LBBB with reduced EF 35% in 2016 as indication for CRT DDD 60  TTE 2023: IMPRESSIONS     1. Left ventricular ejection fraction, by estimation, is 60 to 65%. The  left ventricle has normal function. The left ventricle has no regional  wall motion abnormalities. There is mild left ventricular hypertrophy.  Left ventricular diastolic parameters  are consistent with Grade I diastolic dysfunction (impaired relaxation).   2. Right ventricular systolic function is normal. The right ventricular  size is normal. There is normal pulmonary artery systolic pressure. The  estimated right ventricular systolic pressure is 24.7 mmHg.   3. Left atrial size was moderately dilated.   4. Right atrial size was mildly dilated.   5. The mitral valve is normal in structure. Mild mitral valve  regurgitation. No evidence of mitral stenosis.   6. The aortic valve is tricuspid. Aortic valve regurgitation is not  visualized. No aortic stenosis is present.   7. The inferior vena cava is normal in size with greater than 50%  respiratory variability, suggesting right atrial pressure of 3 mmHg.      Neuro/Psych  Headaches, neg Seizures PSYCHIATRIC DISORDERS Anxiety         GI/Hepatic negative GI ROS, Neg liver ROS,,,  Endo/Other  diabetes, Type 2    Renal/GU negative Renal ROS  negative genitourinary   Musculoskeletal  (+) Arthritis ,    Abdominal   Peds negative pediatric ROS (+)  Hematology negative hematology ROS (+)   Anesthesia Other Findings   Reproductive/Obstetrics negative OB ROS                              Anesthesia Physical Anesthesia Plan  ASA: 3  Anesthesia Plan: General   Post-op Pain Management: Minimal or no pain anticipated   Induction: Intravenous  PONV Risk Score and Plan: 2 and Ondansetron , Dexamethasone  and Treatment may vary due to age or medical condition  Airway Management Planned: Oral ETT  Additional Equipment: None  Intra-op Plan:   Post-operative Plan: Extubation in OR  Informed Consent: I have reviewed the patients History and Physical, chart, labs and discussed the procedure including the risks, benefits and alternatives for the proposed anesthesia with the patient or authorized representative who has indicated his/her understanding and acceptance.     Dental advisory given  Plan Discussed with: CRNA  Anesthesia Plan Comments: (PAT note written 07/21/2024 by Isaiah Ruder, PA-C.  EP PPM Perioperative Recommendations: Device Information: Clinic EP Physician:  Soyla Norton, MD  Device Type:  Pacemaker  Manufacturer and Phone #:  St. Jude/Abbott: [phone] Pacemaker Dependent?:  Yes.   Date of Last Device Check:  07/19/2024         Normal Device Function?:  Yes.    Electrophysiologist's Recommendations:   Have magnet available.  Provide continuous ECG monitoring when magnet is used or reprogramming is to be performed.   Procedure may interfere with device function.  Magnet should be placed over device during procedure.)         Anesthesia Quick Evaluation  "

## 2024-07-21 NOTE — Progress Notes (Addendum)
 PCP - [facility] Cardiologist - Okey Vina GAILS, MD  Inocencio Soyla Lunger, MD   PPM/ICD - Pacemaker  Device Orders - yes Rep Notified - yes Lynden 9:30 am 07-21-24)  Chest x-ray -  Chest CT- 07-19-24 EKG - 05-16-24 Stress Test -  ECHO - 07-28-21 Cardiac Cath -   CPAP - nightly  GLP-1 -denies  Fasting Blood Sugar - per patient blood sugar ranges 90-108 Checks Blood Sugar  per patient maybe every other day  Blood Thinner Instructions: denies Aspirin  Instructions: denies  ERAS Protcol - NPO  COVID TEST- n/a  Anesthesia review: Yes, hx of HTN, LBBB, DM, OSA and has pacemaker  Patient verbally denies any shortness of breath, fever, cough and chest pain during phone call   -------------  SDW INSTRUCTIONS given:  Your procedure is scheduled on July 25, 2023.  Report to [facility] Main Entrance A at 8:30 A.M., and check in at the Admitting office.  Call this number if you have problems the morning of surgery:  [phone]   Remember:  Do not eat or drink after midnight the night before your surgery      Take these medicines the morning of surgery with A SIP OF WATER  carvedilol  (COREG )  fluticasone (FLONASE)  gabapentin (NEURONTIN)    WHAT DO I DO ABOUT MY DIABETES MEDICATION?   Do not take oral diabetes medicines metFORMIN  (GLUCOPHAGE )  (pills) the morning of surgery.    The day of surgery, do not take other diabetes injectables, including Byetta (exenatide), Bydureon (exenatide ER), Victoza (liraglutide), or Trulicity (dulaglutide).  If your CBG is greater than 220 mg/dL, you may take  of your sliding scale (correction) dose of insulin .   HOW TO MANAGE YOUR DIABETES BEFORE AND AFTER SURGERY  Why is it important to control my blood sugar before and after surgery? Improving blood sugar levels before and after surgery helps healing and can limit problems. A way of improving blood sugar control is eating a healthy diet by:  Eating less sugar and  carbohydrates  Increasing activity/exercise  Talking with your doctor about reaching your blood sugar goals High blood sugars (greater than 180 mg/dL) can raise your risk of infections and slow your recovery, so you will need to focus on controlling your diabetes during the weeks before surgery. Make sure that the doctor who takes care of your diabetes knows about your planned surgery including the date and location.  How do I manage my blood sugar before surgery? Check your blood sugar at least 4 times a day, starting 2 days before surgery, to make sure that the level is not too high or low.  Check your blood sugar the morning of your surgery when you wake up and every 2 hours until you get to the Short Stay unit.  If your blood sugar is less than 70 mg/dL, you will need to treat for low blood sugar: Do not take insulin . Treat a low blood sugar (less than 70 mg/dL) with  cup of clear juice (cranberry or apple), 4 glucose tablets, OR glucose gel. Recheck blood sugar in 15 minutes after treatment (to make sure it is greater than 70 mg/dL). If your blood sugar is not greater than 70 mg/dL on recheck, call [phone] for further instructions. Report your blood sugar to the short stay nurse when you get to Short Stay.  If you are admitted to the hospital after surgery: Your blood sugar will be checked by the staff and you will probably be  given insulin  after surgery (instead of oral diabetes medicines) to make sure you have good blood sugar levels. The goal for blood sugar control after surgery is 80-180 mg/dL.   As of today, STOP taking any Aspirin  (unless otherwise instructed by your surgeon) Aleve, Naproxen, Ibuprofen, Motrin, Advil, Goody's, BC's, all herbal medications, fish oil, and all vitamins.                      Do not wear jewelry, make up, or nail polish            Do not wear lotions, powders, perfumes/colognes, or deodorant.            Do not shave 48 hours prior to surgery.   Men may shave face and neck.            Do not bring valuables to the hospital.            [facility] is not responsible for any belongings or valuables.  Do NOT Smoke (Tobacco/Vaping) 24 hours prior to your procedure If you use a CPAP at night, you may bring all equipment for your overnight stay.   Contacts, glasses, dentures or bridgework may not be worn into surgery.      For patients admitted to the hospital, discharge time will be determined by your treatment team.   Patients discharged the day of surgery will not be allowed to drive home, and someone needs to stay with them for 24 hours.    Special instructions:   [facility]- Preparing For Surgery  Before surgery, you can play an important role. Because skin is not sterile, your skin needs to be as free of germs as possible. You can reduce the number of germs on your skin by washing with CHG (chlorahexidine gluconate) Soap before surgery.  CHG is an antiseptic cleaner which kills germs and bonds with the skin to continue killing germs even after washing.    Oral Hygiene is also important to reduce your risk of infection.  Remember - BRUSH YOUR TEETH THE MORNING OF SURGERY WITH YOUR REGULAR TOOTHPASTE  Please do not use if you have an allergy to CHG or antibacterial soaps. If your skin becomes reddened/irritated stop using the CHG.  Do not shave (including legs and underarms) for at least 48 hours prior to first CHG shower. It is OK to shave your face.  Please follow these instructions carefully.   Shower the NIGHT BEFORE SURGERY and the MORNING OF SURGERY with DIAL Soap.   Pat yourself dry with a CLEAN TOWEL.  Wear CLEAN PAJAMAS to bed the night before surgery  Place CLEAN SHEETS on your bed the night of your first shower and DO NOT SLEEP WITH PETS.   Day of Surgery: Please shower morning of surgery  Wear Clean/Comfortable clothing the morning of surgery Do not apply any deodorants/lotions.   Remember to brush your  teeth WITH YOUR REGULAR TOOTHPASTE.   Questions were answered. Patient verbalized understanding of instructions.

## 2024-07-21 NOTE — Progress Notes (Signed)
 Anesthesia Chart Review: SAME DAY WORK-UP  Case: [id number] Date/Time: 07/24/24 1100   Procedure: VIDEO BRONCHOSCOPY WITH ENDOBRONCHIAL NAVIGATION (Right)   Anesthesia type: General   Diagnosis: Nodule of right lung [R91.1]   Pre-op diagnosis: right lung nodule   Location[facility] 3 / [facility] ENDOS[facility]   Surgeons: Shelah Lamar RAMAN, MD       DISCUSSION: Patient is a 79 year old male scheduled for the above procedure.  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 Jude BiV 02/2015; generator exchange 04/17/2022), DM2 (with neuropathy), OSA (uses CPAP), skin cancer (melanoma RUE 1968; BCC 06/2024), pulmonary nodule.  Last cardiology visit with Dr. Okey was on 06/27/2024.  Notes he had left bundle branch block in 2011 with normal Myoview.  He underwent Medtronic PPM in 2025 for symptomatic second-degree AV block Mobitz type II.  In 2016 LVEF decreased to 35 to 40%, felt due to chronic pacing.  He underwent upgrade to BiV pacer (St. Jude) in 02/2015.  LVEF 45 to 50% in 08/2015 and 60 to 65% in 07/2021.  Last visit lisinopril  was decreased due to some dizziness with low blood pressures.  Consider decreasing carvedilol  to 3.125 mg twice daily if persistent hypotension.  Noted prior spell of A-fib but no recent recurrence on device interrogations and is not on anticoagulation therapy.  She noted plans for bronchoscopy with general anesthesia and wrote, from a cardiac standpoint I think he is at low risk for a major cardiac complication  OK to proceed.  3-month follow-up planned. Last EP visit was on 05/16/2024 with Aniceto Jarvis, NP.  EP PPM Perioperative Recommendations: Device Information: Clinic EP Physician:  Soyla Norton, MD  Device Type:  Pacemaker Manufacturer and Phone #:  St. Jude/Abbott: [phone] Pacemaker Dependent?:  Yes.   Date of Last Device Check:  07/19/2024          Normal Device Function?:  Yes.     Electrophysiologist's Recommendations:   Have magnet available. Provide continuous ECG monitoring when magnet is used or reprogramming is to be performed.  Procedure may interfere with device function.  Magnet should be placed over device during procedure.  Rep notified of procedure date/time by RN.  Anesthesia team to evaluate on the day of surgery.  VS: Ht 6' 1 (1.854 m)   Wt 98.9 kg   BMI 28.77 kg/m   BP Readings from Last 3 Encounters:  06/27/24 (!) 104/40  06/07/24 124/72  05/16/24 132/76   Pulse Readings from Last 3 Encounters:  06/27/24 (!) 59  06/07/24 62  05/16/24 69     PROVIDERS: Randol Dawes, MD is listed as PCP. Reported a PCP with [facility]. Okey Moccasin, MD is cardiologist Norton Soyla, MD is EP   Pleas, Dipti, MD is pulmonologist   LABS: For day of surgery as indicated. As of 11/12/2023 [facility]), A1c 6.4%, total bilirubin 0.5, alk phos 89, ALT 31, AST 37, BUN 18, creatinine 1.02, calcium  9.0, Leukos 124, sodium 146, potassium 4.4, eGFR 75, globin 13.7, hematocrit 40.6, platelet 131.   IMAGES: CT Super D Chest 07/19/2024: Report in process.  CT Chest 05/17/2024 [facility]): Impression: 1. There has been some progression of the nodular branching  opacification in the superior segment of the right lower lobe  since prior study November 2022.. This is felt to reflect  inflammatory or postinflammatory process. 2. Stable small left lower lobe nodules.  EKG: 05/16/2024: Atrial-sensed ventricular-paced rhythm   CV: Echo 07/28/2021: IMPRESSIONS   1. Left ventricular ejection fraction, by estimation, is 60 to 65%. The  left ventricle has normal function. The left ventricle has no regional  wall motion abnormalities. There is mild left ventricular hypertrophy.  Left ventricular diastolic parameters  are consistent with Grade I diastolic dysfunction (impaired relaxation).   2. Right ventricular systolic function is  normal. The right ventricular  size is normal. There is normal pulmonary artery systolic pressure. The  estimated right ventricular systolic pressure is 24.7 mmHg.   3. Left atrial size was moderately dilated.   4. Right atrial size was mildly dilated.   5. The mitral valve is normal in structure. Mild mitral valve  regurgitation. No evidence of mitral stenosis.   6. The aortic valve is tricuspid. Aortic valve regurgitation is not  visualized. No aortic stenosis is present.   7. The inferior vena cava is normal in size with greater than 50%  respiratory variability, suggesting right atrial pressure of 3 mmHg.    Past Medical History:  Diagnosis Date   Arthritis of shoulder    right (CT done 11/2018)   Atrial enlargement, left    Bradycardia 06/30/2014   Diabetes mellitus, type 2 (HCC)    Diabetic peripheral neuropathy (HCC) 01/24/2017   [facility] and [facility] gab[facility]   HLD (hyperlipidemia)    Hypertension 05/2013   LBBB (left bundle branch block)    Melanoma (HCC) 1968   R arm   Mitral regurgitation    Mobitz type 2 second degree heart block    a. s/p Medtronic Adapta L model ADDRL 1 (serial number [id number]) pacemaker. 07/02/14 b.  upgrade to STJ CRTP 02/2015   Non-ischemic cardiomyopathy (HCC)    a. felt to be 2/2 RV pacing   Obstructive sleep apnea    compliant with CPAP   Pacemaker    a. Medtronic Adapta L model ADDRL 1 (serial number [id number]) pacemaker.    Pulmonary nodules    noted on CT of shoulder 03/2016, up to 5mm in size on CT chest 04/2016. repeat 1 year due to h/o melanoma   Syncope 2011   a. 2011 -negative workup except LBBB. thought to be vasovagal   Tricuspid regurgitation     Past Surgical History:  Procedure Laterality Date   EP IMPLANTABLE DEVICE N/A 03/01/2015   STJ CRTP upgrade by Dr Kelsie   FINGER TENDON REPAIR     R 2nd and 3rd finger   INGUINAL HERNIA REPAIR     bilateral   KNEE ARTHROSCOPY  02/2011   L knee for meniscal tear.  Dr. Vernetta   PERMANENT PACEMAKER INSERTION N/A 07/01/2014   MDT ADDRL1 pacemaker implanted for Mobitz II Dr Kelsie   [facility] GENERATOR CHANGEOUT N/A 04/17/2022   Procedure: PPM GENERATOR CHANGEOUT;  Surgeon: Fernande Elspeth BROCKS, MD;  Location[facility] INVASIVE CV LAB;  Service: Cardiovascular;  Laterality: N/A;   UVULECTOMY     laser treatment for sleep apnea (NOT UP3)   VASECTOMY      MEDICATIONS:  atorvastatin  (LIPITOR) 40 MG tablet   carvedilol  (COREG ) 6.25 MG tablet   CINNAMON PO   Coenzyme Q10 (COQ10) 200 MG CAPS   fish oil-omega-3 fatty acids  1000 MG capsule   fluticasone (FLONASE) 50 MCG/ACT nasal spray   furosemide  (LASIX ) 20 MG tablet   gabapentin (NEURONTIN) 100 MG capsule   lisinopril  (ZESTRIL ) 5 MG tablet   metFORMIN  (GLUCOPHAGE ) 500 MG  tablet   Multiple Vitamins-Minerals (MENS 50+ MULTI VITAMIN/MIN PO)   potassium chloride  (KLOR-CON  M) 10 MEQ tablet   Turmeric 500 MG CAPS    influenza  inactive virus vaccine (FLUZONE/FLUARIX) injection 0.5 mL   Lilja Soland, PA-C Surgical Short Stay/Anesthesiology [facility] Phone [phone] [facility] Phone [phone] 07/21/2024 1:49 PM

## 2024-07-21 NOTE — Progress Notes (Signed)
 Spoke with wife.  Pt called [employer] support to for transmission assistance and tech support told patient report came through but not showing on [employer] secured website yet.

## 2024-07-21 NOTE — Telephone Encounter (Signed)
 Spoke with [employer] support representative.  He reports there is not a remote transmission today and the patient did not send the transmission correctly.  Pt needs to push the button twice not once.

## 2024-07-21 NOTE — Progress Notes (Signed)
 EPIC Encounter for ICM Monitoring  Patient Name: Alexander Curtis is a 79 y.o. male Date: 07/21/2024 Primary Care Physican: Randol Dawes, MD Primary Cardiologist: Okey Electrophysiologist: Inocencio Bi-V Pacing:  96%      10/08/2023 Weight: 211 lbs 11/12/2023 Office Visit: 213.6 lbs 02/10/2024 Home Weight: 213 lbs 05/16/2024 Weight: 225 lbs 05/29/2024 Weight: 216 lbs 06/27/2024 Office 218 lbs   Spoke with wife and heart failure questions reviewed.  Transmission results reviewed.  Pt asymptomatic for fluid accumulation.   [place] in December and returns 07/16/2024.  Lesions in lungs possibly from agent orange in [place].  Lung bx scheduled 07/24/2024.     Diet:  Wife is monitoring sodium intake and limiting daily intake.     Since 07/03/2024 ICM Remote Transmission: [place] thoracic impedance suggesting possible fluid accumulation starting 07/17/2024 (on cruise starting 07/16/2024) and starting to trend back toward baseline 07/19/2024.   Also suggesting possible fluid accumulation starting 06/29/2024 and returned to normal 07/04/2024 after taking 1 20 mg PRN Furosemide .     Prescribed: Furosemide  20 mg take 2 tablets (40 mg total) by mouth as needed if/when pt has weight gain and/or ankle swelling.  Take with potassium. Potassium 10 mEq take 1 tablet (10 mEq total) by mouth when taking Furosemide    Recommendations:   Pt attempting to send updated transmission today, 07/21/2024 and will review once received.     Follow-up plan: ICM clinic phone appointment on 07/31/2024 to recheck fluid levels.  91 day device clinic remote transmission 10/16/2024.   EP/Cardiology Office Visits:  Recall 05/11/2025 with Dr Inocencio.  No recall for yearly 2026 appt with Dr Okey (last visit 06/27/2024)   Copy of ICM check sent to Dr. Inocencio.     Remote monitoring is medically necessary for Heart Failure Management.    Daily Thoracic Impedance ICM trend: 04/20/2024 through 07/19/2024.    12-14 Month Thoracic Impedance  ICM trend:     Mitzie GORMAN Garner, RN 07/21/2024 9:53 AM

## 2024-07-21 NOTE — Progress Notes (Signed)
 Spoke with wife and patient.   Assisted in sending remote transmission to check the fluid levels.  Transmission results reviewed.   Thoracici impedance trending close to baseline 07/21/2024.  Advised to limit salt and fluid intake. Will recheck fluid levels on 07/31/2024.

## 2024-07-24 ENCOUNTER — Ambulatory Visit (HOSPITAL_COMMUNITY)
Admission: RE | Admit: 2024-07-24 | Discharge: 2024-07-24 | Disposition: A | Attending: Emergency Medicine | Admitting: Emergency Medicine

## 2024-07-24 ENCOUNTER — Encounter (HOSPITAL_COMMUNITY): Admission: RE | Disposition: A | Payer: Self-pay | Source: Home / Self Care | Attending: Emergency Medicine

## 2024-07-24 ENCOUNTER — Ambulatory Visit (HOSPITAL_COMMUNITY)

## 2024-07-24 ENCOUNTER — Ambulatory Visit (HOSPITAL_COMMUNITY): Payer: Self-pay

## 2024-07-24 ENCOUNTER — Encounter (HOSPITAL_COMMUNITY): Payer: Self-pay | Admitting: Emergency Medicine

## 2024-07-24 DIAGNOSIS — R911 Solitary pulmonary nodule: Secondary | ICD-10-CM

## 2024-07-24 DIAGNOSIS — G4730 Sleep apnea, unspecified: Secondary | ICD-10-CM

## 2024-07-24 DIAGNOSIS — Z833 Family history of diabetes mellitus: Secondary | ICD-10-CM | POA: Insufficient documentation

## 2024-07-24 DIAGNOSIS — E119 Type 2 diabetes mellitus without complications: Secondary | ICD-10-CM

## 2024-07-24 DIAGNOSIS — Z85820 Personal history of malignant melanoma of skin: Secondary | ICD-10-CM | POA: Insufficient documentation

## 2024-07-24 DIAGNOSIS — E785 Hyperlipidemia, unspecified: Secondary | ICD-10-CM | POA: Diagnosis not present

## 2024-07-24 DIAGNOSIS — M1990 Unspecified osteoarthritis, unspecified site: Secondary | ICD-10-CM | POA: Diagnosis not present

## 2024-07-24 DIAGNOSIS — I441 Atrioventricular block, second degree: Secondary | ICD-10-CM | POA: Insufficient documentation

## 2024-07-24 DIAGNOSIS — I447 Left bundle-branch block, unspecified: Secondary | ICD-10-CM | POA: Diagnosis not present

## 2024-07-24 DIAGNOSIS — E1142 Type 2 diabetes mellitus with diabetic polyneuropathy: Secondary | ICD-10-CM | POA: Insufficient documentation

## 2024-07-24 DIAGNOSIS — I428 Other cardiomyopathies: Secondary | ICD-10-CM | POA: Insufficient documentation

## 2024-07-24 DIAGNOSIS — Z7984 Long term (current) use of oral hypoglycemic drugs: Secondary | ICD-10-CM | POA: Insufficient documentation

## 2024-07-24 DIAGNOSIS — I10 Essential (primary) hypertension: Secondary | ICD-10-CM | POA: Diagnosis not present

## 2024-07-24 DIAGNOSIS — Z79899 Other long term (current) drug therapy: Secondary | ICD-10-CM | POA: Diagnosis not present

## 2024-07-24 DIAGNOSIS — G4733 Obstructive sleep apnea (adult) (pediatric): Secondary | ICD-10-CM | POA: Insufficient documentation

## 2024-07-24 DIAGNOSIS — R918 Other nonspecific abnormal finding of lung field: Secondary | ICD-10-CM | POA: Insufficient documentation

## 2024-07-24 DIAGNOSIS — Z950 Presence of cardiac pacemaker: Secondary | ICD-10-CM | POA: Insufficient documentation

## 2024-07-24 DIAGNOSIS — Z8249 Family history of ischemic heart disease and other diseases of the circulatory system: Secondary | ICD-10-CM | POA: Insufficient documentation

## 2024-07-24 HISTORY — PX: BRONCHIAL BRUSHINGS: SHX5108

## 2024-07-24 HISTORY — PX: BRONCHIAL NEEDLE ASPIRATION BIOPSY: SHX5106

## 2024-07-24 HISTORY — PX: VIDEO BRONCHOSCOPY WITH ENDOBRONCHIAL NAVIGATION: SHX6175

## 2024-07-24 LAB — GLUCOSE, CAPILLARY
Glucose-Capillary: 118 mg/dL — ABNORMAL HIGH (ref 70–99)
Glucose-Capillary: 123 mg/dL — ABNORMAL HIGH (ref 70–99)

## 2024-07-24 MED ORDER — ONDANSETRON HCL 4 MG/2ML IJ SOLN
INTRAMUSCULAR | Status: DC | PRN
  Administered 2024-07-24: 4 mg via INTRAVENOUS

## 2024-07-24 MED ORDER — LIDOCAINE 2% (20 MG/ML) 5 ML SYRINGE
INTRAMUSCULAR | Status: DC | PRN
  Administered 2024-07-24: 60 mg via INTRAVENOUS

## 2024-07-24 MED ORDER — CHLORHEXIDINE GLUCONATE 0.12 % MT SOLN
OROMUCOSAL | Status: AC
  Administered 2024-07-24: 15 mL via OROMUCOSAL
  Filled 2024-07-24: qty 15

## 2024-07-24 MED ORDER — CHLORHEXIDINE GLUCONATE 0.12 % MT SOLN: 15.0000 mL | Freq: Once | OROMUCOSAL | Status: AC

## 2024-07-24 MED ORDER — PROPOFOL 10 MG/ML IV BOLUS
INTRAVENOUS | Status: DC | PRN
  Administered 2024-07-24: 150 mg via INTRAVENOUS

## 2024-07-24 MED ORDER — DEXAMETHASONE SOD PHOSPHATE PF 10 MG/ML IJ SOLN
INTRAMUSCULAR | Status: DC | PRN
  Administered 2024-07-24: 10 mg via INTRAVENOUS

## 2024-07-24 MED ORDER — SUGAMMADEX SODIUM 200 MG/2ML IV SOLN
INTRAVENOUS | Status: DC | PRN
  Administered 2024-07-24: 196.6 mg via INTRAVENOUS

## 2024-07-24 MED ORDER — LACTATED RINGERS IV SOLN: INTRAVENOUS | Status: DC

## 2024-07-24 MED ORDER — ROCURONIUM BROMIDE 10 MG/ML (PF) SYRINGE
PREFILLED_SYRINGE | INTRAVENOUS | Status: DC | PRN
  Administered 2024-07-24: 50 mg via INTRAVENOUS

## 2024-07-24 MED ORDER — PROPOFOL 500 MG/50ML IV EMUL
INTRAVENOUS | Status: DC | PRN
  Administered 2024-07-24: 150 ug/kg/min via INTRAVENOUS

## 2024-07-24 MED ORDER — INSULIN ASPART 100 UNIT/ML IJ SOLN: 0.0000 [IU] | INTRAMUSCULAR | Status: DC | PRN

## 2024-07-24 MED ORDER — PHENYLEPHRINE HCL-NACL 20-0.9 MG/250ML-% IV SOLN
INTRAVENOUS | Status: DC | PRN
  Administered 2024-07-24: 25 ug/min via INTRAVENOUS

## 2024-07-24 MED ORDER — PHENYLEPHRINE 80 MCG/ML (10ML) SYRINGE FOR IV PUSH (FOR BLOOD PRESSURE SUPPORT)
PREFILLED_SYRINGE | INTRAVENOUS | Status: DC | PRN
  Administered 2024-07-24: 80 ug via INTRAVENOUS

## 2024-07-24 NOTE — Discharge Instructions (Addendum)

## 2024-07-24 NOTE — Op Note (Signed)
 Video Bronchoscopy with Robotic Assisted Bronchoscopic Navigation   Date of Operation: 07/24/2024   Pre-op Diagnosis: Ground glass pulmonary nodules  Post-op Diagnosis: Same  Surgeon: Lamar Chris  Assistants: None  Anesthesia: General endotracheal anesthesia  Operation: Flexible video fiberoptic bronchoscopy with robotic assistance and biopsies.  Estimated Blood Loss: Minimal  Complications: None  Indications and History: Alexander Curtis is a 79 y.o. male with history of prior agent orange exposure.  He has been followed for bilateral ground glass nodules.  Some question of increasing size and density of his superior segment right lower lobe nodule on serial imaging.  Recommendation made to achieve a tissue diagnosis via robotic assisted navigational bronchoscopy.  The risks, benefits, complications, treatment options and expected outcomes were discussed with the patient.  The possibilities of pneumothorax, pneumonia, reaction to medication, pulmonary aspiration, perforation of a viscus, bleeding, failure to diagnose a condition and creating a complication requiring transfusion or operation were discussed with the patient who freely signed the consent.    Description of Procedure: The patient was seen in the Preoperative Area, was examined and was deemed appropriate to proceed.  The patient was taken to [facility] Endoscopy room 3, identified as Alexander Curtis and the procedure verified as Flexible Video Fiberoptic Bronchoscopy.  A Time Out was held and the above information confirmed.   Prior to the date of the procedure a high-resolution CT scan of the chest was performed. Utilizing ION software program a virtual tracheobronchial tree was generated to allow the creation of distinct navigation pathways to the patient's parenchymal abnormalities. After being taken to the operating room general anesthesia was initiated and the patient  was orally intubated. The video fiberoptic bronchoscope was  introduced via the endotracheal tube and a general inspection was performed which showed normal right and left lung anatomy. Aspiration of the bilateral mainstems was completed to remove any remaining secretions. Robotic catheter inserted into patient's endotracheal tube.   Target #1 right lower lobe superior segment pulmonary nodule: The distinct navigation pathway prepared prior to this procedure were then utilized to navigate to patient's lesion identified on CT scan. The robotic catheter was secured into place and the vision probe was withdrawn.  Lesion location was approximated using fluoroscopy.  Local registration and targeting was performed using [employer] Cios mobile C-arm three-dimensional imaging.  Localization was challenging because of the nodule is mixed density, somewhat branching.  There did appear to be a ground glass lesion present and biopsies were performed.  Under fluoroscopic guidance transbronchial brushings, transbronchial needle biopsies, and transbronchial f cryoprobe biopsies were performed to be sent for cytology and pathology.  Needle-in-lesion was confirmed using Cios mobile C-arm.     At the end of the procedure a general airway inspection was performed and there was no evidence of active bleeding. The bronchoscope was removed.  The patient tolerated the procedure well. There was no significant blood loss and there were no obvious complications. A post-procedural chest x-ray is pending.  Samples Target #1: 1. Transbronchial brushings from right lower lobe superior segment nodule 2. Transbronchial Wang needle biopsies from right lower lobe superior segment nodule 3. Transbronchial cryoprobe biopsies from right lower lobe superior segment nodule   Plans:  The patient will be discharged from the PACU to home when recovered from anesthesia and after chest x-ray is reviewed. We will review the cytology, pathology and microbiology results with the patient when they  become available. Outpatient followup will be with Dr Pleas.    Lamar Chris,  MD, PhD 07/24/2024, 11:16 AM [facility] [phone] or if no answer before 7:00PM call [phone] For any issues after 7:00PM please call eLink [phone]

## 2024-07-24 NOTE — H&P (Signed)
 Alexander Curtis is an 79 y.o. male.   Chief Complaint: Right lower lobe pulmonary nodule HPI:  79 year old gentleman, never smoker with a history of hypertension, diabetes, nonischemic cardiomyopathy, pacemaker.  He had an Agent Orange exposure in the [employer] in [place].  He has been followed for bilateral pulmonary nodules with serial chest imaging.  2 ground glass pulmonary nodules on the left have been followed and have been unchanged.  A subtle branching ground glass nodule in the superior segment of the right lower lobe has become slightly more prominent and more solid on serial imaging.  He presents today for navigational bronchoscopy to evaluate the right lower lobe nodule.  Denies any respiratory issues.  He remains active.  Past Medical History:  Diagnosis Date   Arthritis of shoulder    right (CT done 11/2018)   Atrial enlargement, left    Bradycardia 06/30/2014   Diabetes mellitus, type 2 (HCC)    Diabetic peripheral neuropathy (HCC) 01/24/2017   [facility]   HLD (hyperlipidemia)    Hypertension 05/2013   LBBB (left bundle branch block)    Melanoma (HCC) 1968   R arm   Mitral regurgitation    Mobitz type 2 second degree heart block    a. s/p Medtronic Adapta L model ADDRL 1 (serial number [id number]) pacemaker. 07/02/14 b.  upgrade to STJ CRTP 02/2015   Non-ischemic cardiomyopathy (HCC)    a. felt to be 2/2 RV pacing   Obstructive sleep apnea    compliant with CPAP   Pacemaker    a. Medtronic Adapta L model ADDRL 1 (serial number [id number]) pacemaker.    Pulmonary nodules    noted on CT of shoulder 03/2016, up to 5mm in size on CT chest 04/2016. repeat 1 year due to h/o melanoma   Syncope 2011   a. 2011 -negative workup except LBBB. thought to be vasovagal   Tricuspid regurgitation     Past Surgical History:  Procedure Laterality Date   EP IMPLANTABLE DEVICE N/A 03/01/2015   STJ CRTP upgrade by Dr Kelsie   FINGER TENDON REPAIR     R  2nd and 3rd finger   INGUINAL HERNIA REPAIR     bilateral   KNEE ARTHROSCOPY  02/2011   L knee for meniscal tear. Dr. Vernetta   PERMANENT PACEMAKER INSERTION N/A 07/01/2014   MDT ADDRL1 pacemaker implanted for Mobitz II Dr Kelsie   [facility] GENERATOR CHANGEOUT N/A 04/17/2022   Procedure: PPM GENERATOR CHANGEOUT;  Surgeon: Fernande Elspeth BROCKS, MD;  Location: [facility] INVASIVE CV LAB;  Service: Cardiovascular;  Laterality: N/A;   UVULECTOMY     laser treatment for sleep apnea (NOT UP3)   VASECTOMY      Family History  Problem Relation Age of Onset   Dementia Mother    Cancer Mother        ?type, was told it contributed to death, per pt   Melanoma Mother 15   Stroke Father 39       cerebral aneurysm   Diabetes Sister        borderline, obese   Stroke Sister 48   Diabetes Maternal Grandmother    Heart disease Maternal Grandfather    Heart attack Maternal Grandfather    Dementia Paternal Aunt    Parkinson's disease Maternal Aunt    Hypertension Neg Hx    Social History:  reports that he has never smoked. He has never used smokeless tobacco. He reports current  alcohol use. He reports that he does not use drugs.  Allergies: Allergies[1]  Medications Prior to Admission  Medication Sig Dispense Refill   atorvastatin  (LIPITOR) 40 MG tablet Take 40 mg by mouth at bedtime.     carvedilol  (COREG ) 6.25 MG tablet TAKE 1 TABLET(6.25 MG) BY MOUTH TWICE DAILY WITH A MEAL 180 tablet 1   CINNAMON PO Take 1,000 mg by mouth 2 (two) times daily.     Coenzyme Q10 (COQ10) 200 MG CAPS Take 200 mg by mouth 2 (two) times daily.     fish oil-omega-3 fatty acids  1000 MG capsule Take 1 g by mouth 2 (two) times daily.     gabapentin (NEURONTIN) 100 MG capsule Take 100 mg by mouth 2 (two) times daily.     lisinopril  (ZESTRIL ) 5 MG tablet Take 1 tablet (5 mg total) by mouth daily. 90 tablet 3   metFORMIN  (GLUCOPHAGE ) 500 MG tablet Take 500 mg by mouth 2 (two) times daily with a meal.     Multiple Vitamins-Minerals (MENS  50+ MULTI VITAMIN/MIN PO) Take 1 tablet by mouth daily.     Turmeric 500 MG CAPS Take 500 mg by mouth daily.     fluticasone (FLONASE) 50 MCG/ACT nasal spray Place 1 spray into both nostrils daily as needed for allergies or rhinitis.     furosemide  (LASIX ) 20 MG tablet Take 2 tablets (40 mg total) by mouth as needed if/when pt has weight gain and/or ankle swelling.  Take with potassium. (Patient not taking: Reported on 07/21/2024) 60 tablet 1   potassium chloride  (KLOR-CON  M) 10 MEQ tablet Take 1 tablet (10 mEq total) by mouth as needed when taking Furosemide . (Patient not taking: Reported on 07/21/2024) 30 tablet 1    Results for orders placed or performed during the hospital encounter of 07/24/24 (from the past 48 hours)  Glucose, capillary     Status: Abnormal   Collection Time: 07/24/24  9:02 AM  Result Value Ref Range   Glucose-Capillary 118 (H) 70 - 99 mg/dL    Comment: Glucose reference range applies only to samples taken after fasting for at least 8 hours.   Comment 1 Notify RN    Comment 2 Document in Chart    No results found.  Review of Systems As per HPI Blood pressure 131/83, pulse 71, temperature 97.6 F (36.4 C), temperature source Oral, resp. rate 18, height 6' 1 (1.854 m), weight 98.3 kg, SpO2 99%. Physical Exam   Gen: Pleasant, well-nourished, in no distress,  normal affect  ENT: No lesions,  mouth clear,  oropharynx clear, no postnasal drip  Neck: No JVD, no stridor  Lungs: No use of accessory muscles, no crackles or wheezing on normal respiration, no wheeze on forced expiration  Cardiovascular: RRR, heart sounds normal, no murmur or gallops, no peripheral edema  Musculoskeletal: No deformities, no cyanosis or clubbing  Neuro: alert, awake, non focal  Skin: Warm, no lesions or rash   Assessment/Plan Bilateral pulmonary nodules with interval increase in solid component and prominence of mixed density nodule in the superior segment of the right lower lobe.   Plan is for robotic assisted navigational bronchoscopy to facilitate transbronchial biopsies.  The patient understands the plan, rationale, risks and benefits.  He agrees to proceed.  No barriers identified.  Lamar GORMAN Chris, MD 07/24/2024, 10:17 AM       [1]  Allergies Allergen Reactions   Horse-Derived Products Anaphylaxis    REACTION: Anaphlactic Reaction.  Can't take tetanus shot  Pravastatin Other (See Comments)    Joint pain    Adhesive [Tape] Itching and Rash

## 2024-07-24 NOTE — Anesthesia Procedure Notes (Signed)
 Procedure Name: Intubation Date/Time: 07/24/2024 10:35 AM  Performed by: Zelphia Norleen HERO, CRNAPre-anesthesia Checklist: Patient identified, Emergency Drugs available, Suction available and Patient being monitored Patient Re-evaluated:Patient Re-evaluated prior to induction Oxygen Delivery Method: Circle system utilized Preoxygenation: Pre-oxygenation with 100% oxygen Induction Type: IV induction Ventilation: Mask ventilation without difficulty Laryngoscope Size: Mac and 4 Grade View: Grade II Tube type: Oral Tube size: 9.0 mm Number of attempts: 1 Airway Equipment and Method: Stylet Placement Confirmation: ETT inserted through vocal cords under direct vision, positive ETCO2 and breath sounds checked- equal and bilateral Secured at: 23 cm Tube secured with: Tape Dental Injury: Teeth and Oropharynx as per pre-operative assessment

## 2024-07-24 NOTE — Anesthesia Postprocedure Evaluation (Signed)
"   Anesthesia Post Note  Patient: Alexander Curtis  Procedure(s) Performed: VIDEO BRONCHOSCOPY WITH ENDOBRONCHIAL NAVIGATION (Right) BRONCHOSCOPY, WITH CRYOBIOPSY BRONCHOSCOPY, WITH BRUSH BIOPSY BRONCHOSCOPY, WITH NEEDLE ASPIRATION BIOPSY     Patient location during evaluation: PACU Anesthesia Type: General Level of consciousness: awake and alert Pain management: pain level controlled Vital Signs Assessment: post-procedure vital signs reviewed and stable Respiratory status: spontaneous breathing, nonlabored ventilation, respiratory function stable and patient connected to nasal cannula oxygen Cardiovascular status: blood pressure returned to baseline and stable Postop Assessment: no apparent nausea or vomiting Anesthetic complications: no   No notable events documented.  Last Vitals:  Vitals:   07/24/24 1200 07/24/24 1215  BP: 129/67 135/72  Pulse: 72 66  Resp: 13 17  Temp:  (!) 36.4 C  SpO2: 98% 96%    Last Pain:  Vitals:   07/24/24 1215  TempSrc:   PainSc: 0-No pain                 Thom JONELLE Peoples      "

## 2024-07-24 NOTE — Progress Notes (Signed)
 Per Dr. Erma, pacemaker rep does not need to reprogram pacemaker device prior to procedure today. Magnet will be used during brochoscopy.

## 2024-07-24 NOTE — Op Note (Signed)
 Procedure Note  Patient: Alexander Curtis  [employer] Cios mobile C-arm was utilized to identify and biopsy a ground glass nodule in the superior segment of the right lower lobe.  Needle-in-lesion was confirmed using real-time Cios imaging, and images were uploaded to PACS.     Lamar Chris, MD, PhD 07/24/2024, 11:18 AM  Pulmonary and Critical Care [phone] or if no answer before 7:00PM call [phone] For any issues after 7:00PM please call eLink [phone]

## 2024-07-24 NOTE — Transfer of Care (Signed)
 Immediate Anesthesia Transfer of Care Note  Patient: Alexander Curtis  Procedure(s) Performed: VIDEO BRONCHOSCOPY WITH ENDOBRONCHIAL NAVIGATION (Right) BRONCHOSCOPY, WITH CRYOBIOPSY BRONCHOSCOPY, WITH BRUSH BIOPSY BRONCHOSCOPY, WITH NEEDLE ASPIRATION BIOPSY  Patient Location: PACU  Anesthesia Type:General  Level of Consciousness: drowsy  Airway & Oxygen Therapy: Patient Spontanous Breathing and Patient connected to face mask oxygen  Post-op Assessment: Report given to RN and Post -op Vital signs reviewed and stable  Post vital signs: Reviewed and stable  Last Vitals:  Vitals Value Taken Time  BP 122/65 07/24/24 11:25  Temp 98   Pulse 68 07/24/24 11:30  Resp 15 07/24/24 11:30  SpO2 99 % 07/24/24 11:30  Vitals shown include unfiled device data.  Last Pain:  Vitals:   07/24/24 0954  TempSrc:   PainSc: 0-No pain         Complications: No notable events documented.

## 2024-07-25 LAB — CYTOLOGY - NON PAP

## 2024-07-31 ENCOUNTER — Ambulatory Visit: Admitting: Acute Care

## 2024-07-31 ENCOUNTER — Ambulatory Visit

## 2024-07-31 DIAGNOSIS — I5022 Chronic systolic (congestive) heart failure: Secondary | ICD-10-CM

## 2024-07-31 DIAGNOSIS — Z950 Presence of cardiac pacemaker: Secondary | ICD-10-CM

## 2024-08-01 NOTE — Progress Notes (Signed)
 EPIC Encounter for ICM Monitoring  Patient Name: Alexander Curtis is a 79 y.o. male Date: 08/01/2024 Primary Care Physican: Randol Dawes, MD Primary Cardiologist: Okey Electrophysiologist: Inocencio Bi-V Pacing:  97%      10/08/2023 Weight: 211 lbs 11/12/2023 Office Visit: 213.6 lbs 02/10/2024 Home Weight: 213 lbs 05/16/2024 Weight: 225 lbs 05/29/2024 Weight: 216 lbs 06/27/2024 Office 218 lbs   Spoke with patient and heart failure questions reviewed.  Transmission results reviewed.  Pt asymptomatic for fluid accumulation.  Reports feeling well at this time and voices no complaints.     Diet:  Wife is monitoring sodium intake and limiting daily intake.     Since 07/17/2024 ICM Remote Transmission: Corvue thoracic impedance suggesting possible fluid levels returned to normal 07/20/2024.   Prescribed: Furosemide  20 mg take 2 tablets (40 mg total) by mouth as needed if/when pt has weight gain and/or ankle swelling.  Take with potassium. Potassium 10 mEq take 1 tablet (10 mEq total) by mouth when taking Furosemide    Recommendations:   No changes and encouraged to call if experiencing any fluid symptoms.   Follow-up plan: ICM clinic phone appointment on 08/14/2024.  91 day device clinic remote transmission 10/16/2024.   EP/Cardiology Office Visits:  Recall 05/11/2025 with Dr Inocencio.  No recall for yearly 2026 appt with Dr Okey (last visit 06/27/2024)   Copy of ICM check sent to Dr. Inocencio.    Remote monitoring is medically necessary for Heart Failure Management.    Daily Thoracic Impedance ICM trend: 05/02/2024 through 07/31/2024.    12-14 Month Thoracic Impedance ICM trend:     Mitzie GORMAN Garner, RN 08/01/2024 7:32 AM

## 2024-08-02 ENCOUNTER — Ambulatory Visit

## 2024-08-03 ENCOUNTER — Ambulatory Visit (INDEPENDENT_AMBULATORY_CARE_PROVIDER_SITE_OTHER)

## 2024-08-03 VITALS — BP 121/72 | HR 81 | Temp 98.0°F | Ht 73.0 in | Wt 221.4 lb

## 2024-08-03 DIAGNOSIS — R911 Solitary pulmonary nodule: Secondary | ICD-10-CM | POA: Diagnosis not present

## 2024-08-03 NOTE — Progress Notes (Signed)
 "  New Patient Pulmonology Office Visit   Subjective:  [name]: Alexander Curtis, male    DOB: [birth year]  MRN: [id number]  Referred by: Randol Dawes, MD  CC:  Chief Complaint  Patient presents with   Lung Mass    Follow up    HPI Alexander Curtis is a 79 y.o. male who is referred to us  from [place] for lung nodule.   HPI from 05/2024:  History of Present Illness Alexander Curtis is a 79 year old male who presents for a pulmonary consultation due to recent changes in a lung nodule. He was referred for a pulmonary consultation due to changes in a lung nodule.  He has had ground glass lesions in both lung bases for years, possibly since his time in [place]. These lesions have been monitored with annual CT scans for the past six years, and they have remained stable until recently as per his wife who is a retied Nutritional Therapist.  No respiratory symptoms such as dyspnea, cough, or shortness of breath on exertion. He has never smoked.  His past medical history includes a significant cardiac event six years ago, characterized by a sudden onset of third-degree heart block with a heart rate of 22, which was treated with a pacemaker. There is no history of myocardial infarction or elevated cardiac enzymes. He follows up with a cardiologist at [facility].  He has a history of multiple skin cancers, including melanoma, with surgeries performed at the [place]. He attributes these to exposure to Agent Orange during his [employer] in [place]. He notes that every skin lesion has been positive for some type of cancer.    Interval hx 08/03/2024 Underwent bronchoscopic bx on 07/24/2024 Here to review results Denies any complains Reports some throat pain for a day after bronch  ROS Review of symptoms negative except mentioned above   Allergies: Horse-derived products, Pravastatin, and Adhesive [tape]  Current Outpatient Medications:    atorvastatin  (LIPITOR) 40 MG tablet, Take 40 mg by mouth at bedtime.,  Disp: , Rfl:    carvedilol  (COREG ) 6.25 MG tablet, TAKE 1 TABLET(6.25 MG) BY MOUTH TWICE DAILY WITH A MEAL, Disp: 180 tablet, Rfl: 1   CINNAMON PO, Take 1,000 mg by mouth 2 (two) times daily., Disp: , Rfl:    Coenzyme Q10 (COQ10) 200 MG CAPS, Take 200 mg by mouth 2 (two) times daily., Disp: , Rfl:    fish oil-omega-3 fatty acids  1000 MG capsule, Take 1 g by mouth 2 (two) times daily., Disp: , Rfl:    fluticasone (FLONASE) 50 MCG/ACT nasal spray, Place 1 spray into both nostrils daily as needed for allergies or rhinitis., Disp: , Rfl:    gabapentin (NEURONTIN) 100 MG capsule, Take 100 mg by mouth 2 (two) times daily., Disp: , Rfl:    lisinopril  (ZESTRIL ) 5 MG tablet, Take 1 tablet (5 mg total) by mouth daily., Disp: 90 tablet, Rfl: 3   metFORMIN  (GLUCOPHAGE ) 500 MG tablet, Take 500 mg by mouth 2 (two) times daily with a meal., Disp: , Rfl:    Multiple Vitamins-Minerals (MENS 50+ MULTI VITAMIN/MIN PO), Take 1 tablet by mouth daily., Disp: , Rfl:    pantoprazole (PROTONIX) 40 MG tablet, Take 40 mg by mouth., Disp: , Rfl:    Turmeric 500 MG CAPS, Take 500 mg by mouth daily., Disp: , Rfl:  No current facility-administered medications for this visit.  Facility-Administered Medications Ordered in Other Visits:    influenza  inactive virus vaccine (  FLUZONE/FLUARIX) injection 0.5 mL, 0.5 mL, Intramuscular, Once, Randol Dawes, MD Past Medical History:  Diagnosis Date   Arthritis of shoulder    right (CT done 11/2018)   Atrial enlargement, left    Bradycardia 06/30/2014   Diabetes mellitus, type 2 (HCC)    Diabetic peripheral neuropathy (HCC) 01/24/2017   [facility]   HLD (hyperlipidemia)    Hypertension 05/2013   LBBB (left bundle branch block)    Melanoma (HCC) 1968   R arm   Mitral regurgitation    Mobitz type 2 second degree heart block    a. s/p Medtronic Adapta L model ADDRL 1 (serial number [id number]) pacemaker. 07/02/14 b.  upgrade to STJ CRTP 02/2015    Non-ischemic cardiomyopathy (HCC)    a. felt to be 2/2 RV pacing   Obstructive sleep apnea    compliant with CPAP   Pacemaker    a. Medtronic Adapta L model ADDRL 1 (serial number [id number]) pacemaker.    Pulmonary nodules    noted on CT of shoulder 03/2016, up to 5mm in size on CT chest 04/2016. repeat 1 year due to h/o melanoma   Syncope 2011   a. 2011 -negative workup except LBBB. thought to be vasovagal   Tricuspid regurgitation    Past Surgical History:  Procedure Laterality Date   BRONCHIAL BRUSHINGS  07/24/2024   Procedure: BRONCHOSCOPY, WITH BRUSH BIOPSY;  Surgeon: Shelah Lamar RAMAN, MD;  Location: [facility] ENDOSCOPY;  Service: Pulmonary;;   BRONCHIAL NEEDLE ASPIRATION BIOPSY  07/24/2024   Procedure: BRONCHOSCOPY, WITH NEEDLE ASPIRATION BIOPSY;  Surgeon: Shelah Lamar RAMAN, MD;  Location: [facility] ENDOSCOPY;  Service: Pulmonary;;   EP IMPLANTABLE DEVICE N/A 03/01/2015   STJ CRTP upgrade by Dr Kelsie   FINGER TENDON REPAIR     R 2nd and 3rd finger   INGUINAL HERNIA REPAIR     bilateral   KNEE ARTHROSCOPY  02/2011   L knee for meniscal tear. Dr. Vernetta   PERMANENT PACEMAKER INSERTION N/A 07/01/2014   MDT ADDRL1 pacemaker implanted for Mobitz II Dr Kelsie   [facility] GENERATOR CHANGEOUT N/A 04/17/2022   Procedure: PPM GENERATOR CHANGEOUT;  Surgeon: Fernande Elspeth BROCKS, MD;  Location: [facility] INVASIVE CV LAB;  Service: Cardiovascular;  Laterality: N/A;   UVULECTOMY     laser treatment for sleep apnea (NOT UP3)   VASECTOMY     VIDEO BRONCHOSCOPY WITH ENDOBRONCHIAL NAVIGATION Right 07/24/2024   Procedure: VIDEO BRONCHOSCOPY WITH ENDOBRONCHIAL NAVIGATION;  Surgeon: Shelah Lamar RAMAN, MD;  Location: [facility] ENDOSCOPY;  Service: Pulmonary;  Laterality: Right;   Family History  Problem Relation Age of Onset   Dementia Mother    Cancer Mother        ?type, was told it contributed to death, per pt   Melanoma Mother 23   Stroke Father 65       cerebral aneurysm   Diabetes Sister        borderline, obese   Stroke  Sister 69   Diabetes Maternal Grandmother    Heart disease Maternal Grandfather    Heart attack Maternal Grandfather    Dementia Paternal Aunt    Parkinson's disease Maternal Aunt    Hypertension Neg Hx    Social History   Socioeconomic History   Marital status: Married    Spouse name: Not on file   Number of children: 4   Years of education: Not on file   Highest education level: Not on file  Occupational History  Occupation: retired  Tobacco Use   Smoking status: Never    Passive exposure: Never   Smokeless tobacco: Never  Vaping Use   Vaping status: Never Used  Substance and Sexual Activity   Alcohol use: Yes    Comment: rare, some months without any   Drug use: No   Sexual activity: Yes    Partners: Female  Other Topics Concern   Not on file  Social History Narrative   Lives with wife, 1 dog.  Daughter, Rocky, in [place], daugher (Amy) in [place], son in [place], son in [email or web address] grandchildren   Retired school principal   Sold his house and moved to a townhome near [place]. Has a mountain [place]et.   [facility]   Social Drivers of Health   Tobacco Use: Low Risk (08/03/2024)   Patient History    Smoking Tobacco Use: Never    Smokeless Tobacco Use: Never    Passive Exposure: Never  Financial Resource Strain: Not on file  Food Insecurity: Not on file  Transportation Needs: Not on file  Physical Activity: Not on file  Stress: Not on file  Social Connections: Not on file  Intimate Partner Violence: Not on file  Depression (EYV7-0): Not on file  Alcohol Screen: Not on file  Housing: Unknown (07/30/2023)   Received from [facility]   Epic    Unable to Pay for Housing in the Last Year: Not on file    Number of Times Moved in the Last Year: Not on file    At any time in the past 12 months, were you homeless or living in a shelter (including now)?: No  Utilities: Not on file  Health Literacy: Not on file         Objective:  BP 121/72   Pulse 81   Temp 98 F (36.7 C) (Oral)   Ht 6' 1 (1.854 m)   Wt 221 lb 6.4 oz (100.4 kg)   SpO2 94%   BMI 29.21 kg/m    Physical Exam Constitutional:      General: He is not in acute distress.    Appearance: Normal appearance.  HENT:     Mouth/Throat:     Mouth: Mucous membranes are moist.  Cardiovascular:     Rate and Rhythm: Normal rate.  Pulmonary:     Effort: No respiratory distress.     Breath sounds: No wheezing or rales.  Musculoskeletal:     Right lower leg: No edema.     Left lower leg: No edema.  Skin:    General: Skin is warm.  Neurological:     Mental Status: He is alert and oriented to person, place, and time.  Psychiatric:        Mood and Affect: Mood normal.     Diagnostic Review:    Pft     No data to display               Results [facility] on November 04/2024    RADIOLOGY Chest CT: Right lower lobe nodule with increased solid component density, may be slight increase in size located below the pleura (05/17/2024), other 2 sub centimeter nodules on left are stable from 2023 Chest CT: Right lower lobe nodule, stable to mild growth from 2021  (06/2022) Reviewed CT chest from 2021 till 2025   07/2024 cytology: Clinical History: Right lung nodule     FINAL MICROSCOPIC DIAGNOSIS:  A. LUNG,  RLL, BRUSHING:  - No malignant cells identified  - Benign bronchial cells and pulmonary macrophages   B. LUNG, RLL, FINE NEEDLE ASPIRATION AND BIOPSIES:  - No malignant cells identified  - Few benign bronchial cells  - Predominantly blood    SPECIMEN ADEQUACY:  A. Satisfactory for Evaluation  B. Satisfactory but Limited for Evaluation due to Scant Cellularity        Assessment & Plan:   Assessment & Plan Nodule of right lung Reviewed CT chest from 2021 till 2025 Rest of the subcentimeter nodules are small and unchanged, however the right lower lobe ground glass nodule has slow increase  and solid component and may be slight increase in size as well over several years Bronchoscopy bx showed normal lung tissue Because of size and location, can't rule out sampling error Will continue to monitor Repeat CT chest in 6 months Orders:   CT SUPER D CHEST WO CONTRAST; Future   Pulmonary function test; Future    Thank you for the opportunity to take part in the care of ANDRY BOGDEN   Return in about 26 weeks (around 02/01/2025).   Smaran Gaus Pleas, MD [place] [facility] Office: [phone]  I personally spent a total of 60 minutes in the care of the patient today including performing a medically appropriate exam/evaluation, counseling and educating, referring and communicating with other health care professionals, documenting clinical information in the EHR, independently interpreting results, communicating results, and coordinating care.  "

## 2024-08-03 NOTE — Patient Instructions (Signed)
 It was a pleasure to see you today. Your pulmonary function test will be scheduled closer to your next follow up visit. Your will receive a call for your CT chest scheduling.

## 2024-08-03 NOTE — Assessment & Plan Note (Signed)
 Reviewed CT chest from 2021 till 2025 Rest of the subcentimeter nodules are small and unchanged, however the right lower lobe ground glass nodule has slow increase and solid component and may be slight increase in size as well over several years Bronchoscopy bx showed normal lung tissue Because of size and location, can't rule out sampling error Will continue to monitor Repeat CT chest in 6 months Orders:   CT SUPER D CHEST WO CONTRAST; Future   Pulmonary function test; Future

## 2024-08-10 NOTE — Progress Notes (Signed)
 31 day ICM Remote transmission canceled due to [facility] clinic is on hold until further notice.  91 day remote monitoring will continue per protocol.

## 2024-08-14 ENCOUNTER — Ambulatory Visit

## 2025-01-02 ENCOUNTER — Ambulatory Visit
